# Patient Record
Sex: Female | Born: 1950 | Race: White | Hispanic: No | Smoking: Never smoker
Health system: Southern US, Community
[De-identification: ages and names within clinical notes are randomized; demographics above are authoritative.]

## PROBLEM LIST (undated history)

## (undated) DIAGNOSIS — M1990 Unspecified osteoarthritis, unspecified site: Secondary | ICD-10-CM

## (undated) DIAGNOSIS — Z87442 Personal history of urinary calculi: Secondary | ICD-10-CM

## (undated) DIAGNOSIS — E039 Hypothyroidism, unspecified: Secondary | ICD-10-CM

## (undated) DIAGNOSIS — E7800 Pure hypercholesterolemia, unspecified: Secondary | ICD-10-CM

## (undated) DIAGNOSIS — R7303 Prediabetes: Secondary | ICD-10-CM

## (undated) DIAGNOSIS — C801 Malignant (primary) neoplasm, unspecified: Secondary | ICD-10-CM

## (undated) DIAGNOSIS — H409 Unspecified glaucoma: Secondary | ICD-10-CM

## (undated) DIAGNOSIS — T7840XA Allergy, unspecified, initial encounter: Secondary | ICD-10-CM

## (undated) DIAGNOSIS — H269 Unspecified cataract: Secondary | ICD-10-CM

## (undated) HISTORY — PX: COLONOSCOPY: SHX174

## (undated) HISTORY — DX: Allergy, unspecified, initial encounter: T78.40XA

## (undated) HISTORY — DX: Unspecified cataract: H26.9

## (undated) HISTORY — DX: Unspecified glaucoma: H40.9

## (undated) HISTORY — DX: Personal history of urinary calculi: Z87.442

## (undated) HISTORY — PX: CATARACT EXTRACTION, BILATERAL: SHX1313

## (undated) HISTORY — PX: EYE SURGERY: SHX253

## (undated) HISTORY — PX: LIPOMA EXCISION: SHX5283

## (undated) HISTORY — DX: Malignant (primary) neoplasm, unspecified: C80.1

## (undated) HISTORY — PX: TONSILLECTOMY: SUR1361

---

## 1984-08-21 HISTORY — PX: TUBAL LIGATION: SHX77

## 2003-01-11 ENCOUNTER — Other Ambulatory Visit: Admission: RE | Admit: 2003-01-11 | Discharge: 2003-01-11 | Payer: Self-pay | Admitting: Obstetrics and Gynecology

## 2004-10-20 ENCOUNTER — Other Ambulatory Visit: Admission: RE | Admit: 2004-10-20 | Discharge: 2004-10-20 | Payer: Self-pay | Admitting: Obstetrics and Gynecology

## 2005-04-23 LAB — HM COLONOSCOPY: HM COLON: NORMAL

## 2006-03-23 HISTORY — PX: CERVICAL BIOPSY: SHX590

## 2006-03-25 ENCOUNTER — Encounter (INDEPENDENT_AMBULATORY_CARE_PROVIDER_SITE_OTHER): Payer: Self-pay | Admitting: Specialist

## 2006-03-25 ENCOUNTER — Ambulatory Visit (HOSPITAL_COMMUNITY): Admission: RE | Admit: 2006-03-25 | Discharge: 2006-03-25 | Payer: Self-pay | Admitting: Obstetrics and Gynecology

## 2009-12-02 ENCOUNTER — Encounter
Admission: RE | Admit: 2009-12-02 | Discharge: 2010-01-20 | Payer: Self-pay | Source: Home / Self Care | Admitting: Family Medicine

## 2009-12-16 ENCOUNTER — Encounter: Admission: RE | Admit: 2009-12-16 | Discharge: 2009-12-16 | Payer: Self-pay | Admitting: Family Medicine

## 2009-12-16 LAB — HM DEXA SCAN

## 2010-09-08 NOTE — Op Note (Signed)
Kathleen Mcclain, HE             ACCOUNT NO.:  [id number]   MEDICAL RECORD NO.:  [id number]          PATIENT TYPE:  AMB   LOCATION:  SDC                           FACILITY:  [facility]   PHYSICIAN:  Randye Lobo, M.D.   DATE OF BIRTH:  1950/11/08   DATE OF PROCEDURE:  03/25/2006  DATE OF DISCHARGE:                               OPERATIVE REPORT   PREOPERATIVE DIAGNOSIS:  High-grade squamous intraepithelial lesion.   POSTOPERATIVE DIAGNOSIS:  High-grade squamous intraepithelial lesion.   PROCEDURE:  Cold knife conization of the cervix.   SURGEON:  Randye Lobo, M.D.   ANESTHESIA:  MAC.   IV FLUIDS:  1200 mL Ringer's lactate.   ESTIMATED BLOOD LOSS:  Minimal.   URINE OUTPUT:  5 mL by I&O catheterization.   COMPLICATIONS:  None.   INDICATIONS FOR THE PROCEDURE:  The patient is a 60 year old para 19  Caucasian female with an abnormal Pap smear in September 2007.  The  patient had an ASCUS Pap smear cannot rule out high-grade squamous  intraepithelial lesion, as she had testing which was positive for high  risk HPV.  The patient underwent a colposcopy on 02/21/2006 which  documented an ECC with atypical cells consistent with a squamous  fragment with both low-grade and high-grade squamous intraepithelial  lesion features.  The biopsy of the exocervix documented cervicitis and  atypia consistent with high-grade squamous intraepithelial lesion.  A  plan was made to proceed with a LEEP conization of the cervix after  risks, benefits, and alternatives were discussed.   FINDINGS:  When the speculum was placed inside vagina both the vaginal  mucosa and the cervix appeared to be friable to the touch.  Lugol's  solution was applied and the patient appeared to have a squamocolumnar  junction which extended further inferiorly than  superiorly.  The  visualization with the insulated speculum was not ideal for performing a  LEEP procedure.  A Sims retractor and a Occupational hygienist were  used  instead, which gave excellent visualization of the cervix.  The decision  was made, at this time, to proceed with a cold knife conization of the  cervix to improve the cone procedure for the patient's anatomy.   SPECIMENS:  The cone specimen was sent to pathology with a suture tied  at the 12 o'clock position.   DESCRIPTION OF PROCEDURE:  The patient was reidentified in the  preoperative hold area.  She did receive clindamycin 900 mg IV for  antibiotic prophylaxis.   In the operating room, MAC anesthesia was induced; and the patient was  placed in the dorsal lithotomy position.  The vagina was sterilely  prepped and the patient was catheterized of urine.  It appeared that she  had previously emptied her bladder.  The patient was then sterilely  draped.   An insulated speculum was placed in the vagina and the findings are as  noted above.  The insulated speculum was withdrawn, and a Sims retractor  and the Deaver retractor were used to create much better visualization  of the cervix.  At this time, a decision was made  to proceed with a cold  knife conization of the cervix.  Figure-of-eight sutures were placed at  the 3 and 6 o'clock positions of the cervix to create hemostasis of the  descending branches of the uterine arteries.  These sutures were held.  The sutures were #0 chromic sutures.  The cervix was restained with some  Lugol's material, at this time.  The angled scalpel was then used to  sharply excise the cone specimen.  The specimen was then marked at the  12 o'clock position with silk suture; and was sent to pathology.   Two Sturmdorf sutures of #0 chromic were placed at the 12 and 6 o'clock  positions.  The endocervix had been previously coagulated with monopolar  cautery in order to create good hemostasis, but the sutures were helpful  in creating additional hemostasis at the 6 and 12 o'clock positions.  Hemostasis was noted to be satisfactory, at this time; and  the sutures  were, therefore, cut to leave short tags.  The instruments were removed  from the vagina.   This concluded the patient's procedure.  There were no complications.  All needle, instrument, and sponge counts were correct.      Randye Lobo, M.D.  Electronically Signed     BES/MEDQ  D:  03/25/2006  T:  03/25/2006  Job:  [id number]

## 2011-03-30 ENCOUNTER — Emergency Department (HOSPITAL_COMMUNITY): Payer: BC Managed Care – PPO

## 2011-03-30 ENCOUNTER — Encounter: Payer: Self-pay | Admitting: Emergency Medicine

## 2011-03-30 ENCOUNTER — Emergency Department (HOSPITAL_COMMUNITY)
Admission: EM | Admit: 2011-03-30 | Discharge: 2011-03-30 | Disposition: A | Discharge status: Home / Self Care | Payer: BC Managed Care – PPO | Attending: Emergency Medicine | Admitting: Emergency Medicine

## 2011-03-30 DIAGNOSIS — E039 Hypothyroidism, unspecified: Secondary | ICD-10-CM | POA: Insufficient documentation

## 2011-03-30 DIAGNOSIS — E7800 Pure hypercholesterolemia, unspecified: Secondary | ICD-10-CM | POA: Insufficient documentation

## 2011-03-30 DIAGNOSIS — R1110 Vomiting, unspecified: Secondary | ICD-10-CM | POA: Insufficient documentation

## 2011-03-30 DIAGNOSIS — R197 Diarrhea, unspecified: Secondary | ICD-10-CM | POA: Insufficient documentation

## 2011-03-30 DIAGNOSIS — R1032 Left lower quadrant pain: Secondary | ICD-10-CM | POA: Insufficient documentation

## 2011-03-30 DIAGNOSIS — N201 Calculus of ureter: Secondary | ICD-10-CM

## 2011-03-30 HISTORY — DX: Pure hypercholesterolemia, unspecified: E78.00

## 2011-03-30 HISTORY — DX: Hypothyroidism, unspecified: E03.9

## 2011-03-30 LAB — URINE MICROSCOPIC-ADD ON

## 2011-03-30 LAB — COMPREHENSIVE METABOLIC PANEL
ALT: 17 U/L (ref 0–35)
Alkaline Phosphatase: 61 U/L (ref 39–117)
Chloride: 104 mEq/L (ref 96–112)
Creatinine, Ser: 0.86 mg/dL (ref 0.50–1.10)
GFR calc Af Amer: 85 mL/min — ABNORMAL LOW (ref >90)
GFR calc non Af Amer: 73 mL/min — ABNORMAL LOW (ref >90)
Glucose, Bld: 162 mg/dL — ABNORMAL HIGH (ref 70–99)
Potassium: 3.6 mEq/L (ref 3.5–5.1)
Total Bilirubin: 0.5 mg/dL (ref 0.3–1.2)

## 2011-03-30 LAB — URINALYSIS, ROUTINE W REFLEX MICROSCOPIC
Ketones, ur: NEGATIVE mg/dL
Nitrite: NEGATIVE
Urobilinogen, UA: 0.2 mg/dL (ref 0.0–1.0)

## 2011-03-30 LAB — CBC
Hemoglobin: 13.9 g/dL (ref 12.0–15.0)
MCH: 29 pg (ref 26.0–34.0)
Platelets: 258 10*3/uL (ref 150–400)
RBC: 4.8 MIL/uL (ref 3.87–5.11)
RDW: 12.8 % (ref 11.5–15.5)
WBC: 15.9 10*3/uL — ABNORMAL HIGH (ref 4.0–10.5)

## 2011-03-30 LAB — DIFFERENTIAL
Basophils Absolute: 0.1 10*3/uL (ref 0.0–0.1)
Eosinophils Absolute: 0 10*3/uL (ref 0.0–0.7)
Eosinophils Relative: 0 % (ref 0–5)
Monocytes Absolute: 0.8 10*3/uL (ref 0.1–1.0)
Neutro Abs: 13.6 10*3/uL — ABNORMAL HIGH (ref 1.7–7.7)
Neutrophils Relative %: 86 % — ABNORMAL HIGH (ref 43–77)

## 2011-03-30 MED ORDER — SODIUM CHLORIDE 0.9 % IV BOLUS (SEPSIS)
2000.0000 mL | Freq: Once | INTRAVENOUS | Status: AC
  Administered 2011-03-30: 1000 mL via INTRAVENOUS

## 2011-03-30 MED ORDER — ONDANSETRON HCL 4 MG PO TABS: 4.0000 mg | ORAL_TABLET | Freq: Four times a day (QID) | ORAL | Status: AC

## 2011-03-30 MED ORDER — OXYCODONE-ACETAMINOPHEN 5-325 MG PO TABS: 2.0000 | ORAL_TABLET | ORAL | Status: AC | PRN

## 2011-03-30 MED ORDER — CIPROFLOXACIN HCL 500 MG PO TABS: 500.0000 mg | ORAL_TABLET | Freq: Two times a day (BID) | ORAL | Status: AC

## 2011-03-30 MED ORDER — SODIUM CHLORIDE 0.9 % IV SOLN: INTRAVENOUS | Status: DC

## 2011-03-30 MED ORDER — KETOROLAC TROMETHAMINE 30 MG/ML IJ SOLN
30.0000 mg | Freq: Once | INTRAMUSCULAR | Status: AC
  Administered 2011-03-30: 30 mg via INTRAVENOUS
  Filled 2011-03-30: qty 1

## 2011-03-30 MED ORDER — ONDANSETRON HCL 4 MG/2ML IJ SOLN
4.0000 mg | Freq: Once | INTRAMUSCULAR | Status: AC
  Administered 2011-03-30: 4 mg via INTRAVENOUS
  Filled 2011-03-30: qty 2

## 2011-03-30 MED ORDER — IOHEXOL 300 MG/ML  SOLN
80.0000 mL | Freq: Once | INTRAMUSCULAR | Status: AC | PRN
  Administered 2011-03-30: 80 mL via INTRAVENOUS

## 2011-03-30 MED ORDER — HYDROMORPHONE HCL PF 1 MG/ML IJ SOLN
1.0000 mg | Freq: Once | INTRAMUSCULAR | Status: AC
  Administered 2011-03-30: 1 mg via INTRAVENOUS
  Filled 2011-03-30: qty 1

## 2011-03-30 NOTE — ED Provider Notes (Signed)
History     CSN: [id number] Arrival date & time: 03/30/2011  3:59 AM   First MD Initiated Contact with Patient 03/30/11 [phone]      Chief Complaint  Patient presents with  . Abdominal Pain    (Consider location/radiation/quality/duration/timing/severity/associated sxs/prior treatment) HPI This 60 year old female has about 12 hours of left lower quadrant constant positional abdominal pain which is moderately severe and associated with nonbloody vomiting and diarrhea without dysuria chest pain shortness of breath or right-sided abdominal pain. This pain is not colicky. It has been constant without radiation or associated symptoms other than the vomiting and diarrhea. She's had no fever or chills bodyaches. She has not had diverticulitis in the past but may have had diverticulosis and a prior colonoscopy but she is not sure about that. Past Medical History  Diagnosis Date  . Hypothyroid   . Hypercholesteremia   . Hypothyroidism     Past Surgical History  Procedure Date  . Tubal ligation     No family history on file.  History  Substance Use Topics  . Smoking status: Never Smoker   . Smokeless tobacco: Not on file  . Alcohol Use: No    OB History    Grav Para Term Preterm Abortions TAB SAB Ect Mult Living                  Review of Systems  Constitutional: Negative for fever.       10 Systems reviewed and are negative for acute change except as noted in the HPI.  HENT: Negative for congestion.   Eyes: Negative for discharge and redness.  Respiratory: Negative for cough and shortness of breath.   Cardiovascular: Negative for chest pain.  Gastrointestinal: Positive for nausea, vomiting, abdominal pain and diarrhea. Negative for blood in stool.  Genitourinary: Negative for dysuria.  Musculoskeletal: Negative for back pain.  Skin: Negative for rash.  Neurological: Negative for syncope, numbness and headaches.  Psychiatric/Behavioral:       No behavior change.     Allergies  Penicillins  Home Medications   Current Outpatient Rx  Name Route Sig Dispense Refill  . ALENDRONATE SODIUM 70 MG PO TABS Oral Take 70 mg by mouth every 7 (seven) days. On saturday     . ATORVASTATIN CALCIUM 10 MG PO TABS Oral Take 10 mg by mouth daily.      Marland Kitchen ESTRADIOL 10 MCG VA TABS Vaginal Place 1 tablet vaginally 2 (two) times a week. Monday and wednesdays     . LEVOTHYROXINE SODIUM 75 MCG PO TABS Oral Take 75 mcg by mouth daily.        BP 161/89  Pulse 109  Temp(Src) 98 F (36.7 C) (Oral)  Resp 16  SpO2 100%  Physical Exam  Nursing note and vitals reviewed. Constitutional:       Awake, alert, nontoxic appearance.  HENT:  Head: Atraumatic.       Oropharynx noninjected mucous membranes somewhat dry  Eyes: Right eye exhibits no discharge. Left eye exhibits no discharge.  Neck: Neck supple.  Cardiovascular: Normal rate and regular rhythm.   No murmur heard. Pulmonary/Chest: Effort normal and breath sounds normal. No respiratory distress. She has no wheezes. She has no rales. She exhibits no tenderness.  Abdominal: Soft. Bowel sounds are normal. She exhibits no mass. There is tenderness. There is guarding. There is no rebound.       Moderate left lower quadrant tenderness without rebound or percussion tenderness; no CVA tenderness  Musculoskeletal: She  exhibits no edema and no tenderness.       Baseline ROM, no obvious new focal weakness.  Neurological: She is alert.       Mental status and motor strength appears baseline for patient and situation.  Skin: No rash noted.  Psychiatric: She has a normal mood and affect.    ED Course  Procedures (including critical care time)  CT pnd, care assumed by Dr. Rosalia Hammers for Dx and ED Dispo 0800 Labs Reviewed  COMPREHENSIVE METABOLIC PANEL - Abnormal; Notable for the following:    Glucose, Bld 162 (*)    GFR calc non Af Amer 73 (*)    GFR calc Af Amer 85 (*)    All other components within normal limits  CBC -  Abnormal; Notable for the following:    WBC 15.9 (*)    All other components within normal limits  DIFFERENTIAL - Abnormal; Notable for the following:    Neutrophils Relative 86 (*)    Neutro Abs 13.6 (*)    Lymphocytes Relative 8 (*)    All other components within normal limits  URINALYSIS, ROUTINE W REFLEX MICROSCOPIC - Abnormal; Notable for the following:    APPearance TURBID (*)    pH 8.5 (*)    Leukocytes, UA SMALL (*)    All other components within normal limits  URINE MICROSCOPIC-ADD ON - Abnormal; Notable for the following:    Bacteria, UA FEW (*)    All other components within normal limits  CBC  LIPASE, BLOOD   No results found.   No diagnosis found.    MDM          Hurman Horn, MD 03/30/11 [phone]

## 2011-03-30 NOTE — ED Provider Notes (Signed)
Patient's care assumed.  Patient states sudden onset of left flank pain last night at 11 PM. She has not had any diarrhea but has had an episode of vomiting. She states the only thing that made the pain better was getting the pain medicine here. Pain is currently 2/10. She has 0-3 red blood cells in her urine. Abdomen is soft and nontender. CT scan is pending.  Patient with 3 mm stone left uvj- pain fee.    Hilario Quarry, MD 03/30/11 [phone]

## 2011-03-30 NOTE — ED Notes (Signed)
Last ate 1700, last emesis 0200, last BM 0130 (scant/ soft). Denies fever.

## 2011-03-30 NOTE — ED Notes (Signed)
PT. REPORTS LEFT ABDOMINAL PAIN WITH VOMITTING  AND DIARRHEA ONSET LAST NIGHT , SLIGHT CHILLS , DENIES FEVER ,  OCCASIONAL DRY COUGH .

## 2013-02-26 ENCOUNTER — Other Ambulatory Visit: Payer: Self-pay | Admitting: Family Medicine

## 2013-02-26 DIAGNOSIS — R0781 Pleurodynia: Secondary | ICD-10-CM

## 2013-02-26 DIAGNOSIS — R1012 Left upper quadrant pain: Secondary | ICD-10-CM

## 2013-02-26 DIAGNOSIS — G8929 Other chronic pain: Secondary | ICD-10-CM

## 2013-03-02 ENCOUNTER — Ambulatory Visit
Admission: RE | Admit: 2013-03-02 | Discharge: 2013-03-02 | Disposition: A | Discharge status: Home / Self Care | Payer: BC Managed Care – PPO | Source: Ambulatory Visit | Attending: Family Medicine | Admitting: Family Medicine

## 2013-03-02 DIAGNOSIS — R0781 Pleurodynia: Secondary | ICD-10-CM

## 2013-03-02 DIAGNOSIS — G8929 Other chronic pain: Secondary | ICD-10-CM

## 2013-03-02 DIAGNOSIS — R1012 Left upper quadrant pain: Secondary | ICD-10-CM

## 2013-03-26 ENCOUNTER — Other Ambulatory Visit: Payer: Self-pay

## 2014-04-06 ENCOUNTER — Other Ambulatory Visit: Payer: Self-pay

## 2014-04-06 LAB — HM PAP SMEAR: HM PAP: NORMAL

## 2014-04-08 LAB — CYTOLOGY - PAP

## 2015-03-07 ENCOUNTER — Encounter: Payer: Self-pay | Admitting: Family Medicine

## 2015-03-07 DIAGNOSIS — H269 Unspecified cataract: Secondary | ICD-10-CM | POA: Insufficient documentation

## 2015-03-07 DIAGNOSIS — Z87442 Personal history of urinary calculi: Secondary | ICD-10-CM | POA: Insufficient documentation

## 2015-03-07 DIAGNOSIS — E7800 Pure hypercholesterolemia, unspecified: Secondary | ICD-10-CM | POA: Insufficient documentation

## 2015-03-07 DIAGNOSIS — T7840XA Allergy, unspecified, initial encounter: Secondary | ICD-10-CM | POA: Insufficient documentation

## 2015-03-07 DIAGNOSIS — E119 Type 2 diabetes mellitus without complications: Secondary | ICD-10-CM | POA: Insufficient documentation

## 2015-03-07 DIAGNOSIS — H409 Unspecified glaucoma: Secondary | ICD-10-CM | POA: Insufficient documentation

## 2015-03-09 ENCOUNTER — Ambulatory Visit (INDEPENDENT_AMBULATORY_CARE_PROVIDER_SITE_OTHER): Payer: BLUE CROSS/BLUE SHIELD | Admitting: Physician Assistant

## 2015-03-09 ENCOUNTER — Encounter: Payer: Self-pay | Admitting: Physician Assistant

## 2015-03-09 VITALS — BP 152/96 | HR 84 | Temp 98.4°F | Resp 18 | Ht 65.0 in | Wt 114.0 lb

## 2015-03-09 DIAGNOSIS — R739 Hyperglycemia, unspecified: Secondary | ICD-10-CM | POA: Diagnosis not present

## 2015-03-09 DIAGNOSIS — E039 Hypothyroidism, unspecified: Secondary | ICD-10-CM | POA: Diagnosis not present

## 2015-03-09 DIAGNOSIS — Z23 Encounter for immunization: Secondary | ICD-10-CM

## 2015-03-09 DIAGNOSIS — R030 Elevated blood-pressure reading, without diagnosis of hypertension: Secondary | ICD-10-CM | POA: Diagnosis not present

## 2015-03-09 DIAGNOSIS — IMO0001 Reserved for inherently not codable concepts without codable children: Secondary | ICD-10-CM

## 2015-03-09 DIAGNOSIS — E7800 Pure hypercholesterolemia, unspecified: Secondary | ICD-10-CM

## 2015-03-09 DIAGNOSIS — Z0000 Encounter for general adult medical examination without abnormal findings: Secondary | ICD-10-CM | POA: Diagnosis not present

## 2015-03-09 LAB — LIPID PANEL
CHOLESTEROL: 148 mg/dL (ref 125–200)
HDL: 73 mg/dL (ref >=46)
LDL CALC: 63 mg/dL (ref <130)
TRIGLYCERIDES: 58 mg/dL (ref <150)
Total CHOL/HDL Ratio: 2 Ratio (ref <=5.0)
VLDL: 12 mg/dL (ref <30)

## 2015-03-09 LAB — CBC WITH DIFFERENTIAL/PLATELET
Basophils Absolute: 0.1 10*3/uL (ref 0.0–0.1)
Basophils Relative: 1 % (ref 0–1)
EOS PCT: 1 % (ref 0–5)
Eosinophils Absolute: 0.1 10*3/uL (ref 0.0–0.7)
HEMATOCRIT: 45.4 % (ref 36.0–46.0)
HEMOGLOBIN: 14.9 g/dL (ref 12.0–15.0)
LYMPHS PCT: 27 % (ref 12–46)
Lymphs Abs: 1.8 10*3/uL (ref 0.7–4.0)
MCH: 28.3 pg (ref 26.0–34.0)
MCHC: 32.8 g/dL (ref 30.0–36.0)
MCV: 86.1 fL (ref 78.0–100.0)
MONO ABS: 0.5 10*3/uL (ref 0.1–1.0)
MONOS PCT: 7 % (ref 3–12)
MPV: 11.7 fL (ref 8.6–12.4)
NEUTROS ABS: 4.2 10*3/uL (ref 1.7–7.7)
Neutrophils Relative %: 64 % (ref 43–77)
Platelets: 249 10*3/uL (ref 150–400)
RBC: 5.27 MIL/uL — ABNORMAL HIGH (ref 3.87–5.11)
RDW: 13.7 % (ref 11.5–15.5)
WBC: 6.6 10*3/uL (ref 4.0–10.5)

## 2015-03-09 LAB — COMPLETE METABOLIC PANEL WITH GFR
ALBUMIN: 4.6 g/dL (ref 3.6–5.1)
ALK PHOS: 68 U/L (ref 33–130)
ALT: 16 U/L (ref 6–29)
AST: 19 U/L (ref 10–35)
BUN: 10 mg/dL (ref 7–25)
CALCIUM: 10.2 mg/dL (ref 8.6–10.4)
CHLORIDE: 106 mmol/L (ref 98–110)
CO2: 26 mmol/L (ref 20–31)
Creat: 0.74 mg/dL (ref 0.50–0.99)
GFR, EST NON AFRICAN AMERICAN: 88 mL/min (ref >=60)
Glucose, Bld: 85 mg/dL (ref 70–99)
POTASSIUM: 5.1 mmol/L (ref 3.5–5.3)
Sodium: 143 mmol/L (ref 135–146)
Total Bilirubin: 1.3 mg/dL — ABNORMAL HIGH (ref 0.2–1.2)
Total Protein: 7.2 g/dL (ref 6.1–8.1)

## 2015-03-09 LAB — HEMOGLOBIN A1C
HEMOGLOBIN A1C: 5.7 % — AB (ref <5.7)
MEAN PLASMA GLUCOSE: 117 mg/dL — AB (ref <117)

## 2015-03-09 MED ORDER — LEVOTHYROXINE SODIUM 75 MCG PO TABS: 75.0000 ug | ORAL_TABLET | Freq: Every day | ORAL | Status: DC

## 2015-03-09 MED ORDER — ATORVASTATIN CALCIUM 10 MG PO TABS: 10.0000 mg | ORAL_TABLET | Freq: Every day | ORAL | Status: DC

## 2015-03-09 NOTE — Progress Notes (Signed)
Patient ID: Kathleen Mcclain MRN: [id number], DOB: [birth year], 64 y.o. Date of Encounter: 03/09/2015,   Chief Complaint: Physical (CPE)  HPI: 64 y.o. y/o white female  here for CPE.   She is also being seen as a new patient to establish care. She was going to the [facility] but since it has closed she is establishing care here.  She sees a gynecologist on a routine basis. States that she also is seen at [facility] urology because of history of kidney stones. Says that she now goes there every 2 years. States that she also sees ophthalmologist routinely Dr. Posey Pronto  She states that several years ago she was told that she was prediabetic and was informed regarding diet and exercise changes.  She states that she loved sweets and carbs and made changes and lost 30 pounds. She continues to be very careful with her diet but says that she does not exercise.  She does not work outside of the home. She states that she has 2 sets of grandchildren. One set lives nearby and the other set lives at [place].  She is taking cholesterol medication as directed. No myalgias or other adverse effects. She is taking her thyroid medication daily.   Review of Systems: Consitutional: No fever, chills, fatigue, night sweats, lymphadenopathy. No significant/unexplained weight changes. Eyes: No visual changes, eye redness, or discharge. ENT/Mouth: No ear pain, sore throat, nasal drainage, or sinus pain. Cardiovascular: No chest pressure,heaviness, tightness or squeezing, even with exertion. No increased shortness of breath or dyspnea on exertion.No palpitations, edema, orthopnea, PND. Respiratory: No cough, hemoptysis, SOB, or wheezing. Gastrointestinal: No anorexia, dysphagia, reflux, pain, nausea, vomiting, hematemesis, diarrhea, constipation, BRBPR, or melena. Breast: No mass, nodules, bulging, or retraction. No skin changes or inflammation. No nipple discharge. No lymphadenopathy. Genitourinary: No  dysuria, hematuria, incontinence, vaginal discharge, pruritis, burning, abnormal bleeding, or pain. Musculoskeletal: No decreased ROM, No joint pain or swelling. No significant pain in neck, back, or extremities. Skin: No rash, pruritis, or concerning lesions. Neurological: No headache, dizziness, syncope, seizures, tremors, memory loss, coordination problems, or paresthesias. Psychological: No anxiety, depression, hallucinations, SI/HI. Endocrine: No polydipsia, polyphagia, polyuria, or known diabetes.No increased fatigue. No palpitations/rapid heart rate. No significant/unexplained weight change. All other systems were reviewed and are otherwise negative.  Past Medical History  Diagnosis Date  . Hypothyroid   . Hypothyroidism   . Allergy   . Cataract   . Diabetes mellitus without complication ([place])     pre diabetic  . Glaucoma   . Hypercholesteremia   . Chronic kidney disease   . Personal history of kidney stones   . Cancer [facility])     pre cancer cells cervical     Past Surgical History  Procedure Laterality Date  . Cervical biopsy  12/07    pre cancerous cells  . Tubal ligation  5/86    Home Meds:  Outpatient Prescriptions Prior to Visit  Medication Sig Dispense Refill  . atorvastatin (LIPITOR) 10 MG tablet Take 10 mg by mouth daily.      . Carboxymeth-Glycerin-Polysorb (REFRESH OPTIVE ADVANCED) 0.5-1-0.5 % SOLN Place 1 drop into both eyes as needed.    . dorzolamide (TRUSOPT) 2 % ophthalmic solution Place 1 drop into both eyes 2 (two) times daily.    Marland Kitchen ketotifen (ZADITOR) 0.025 % ophthalmic solution Place 1 drop into both eyes as needed.    Marland Kitchen levothyroxine (SYNTHROID, LEVOTHROID) 75 MCG tablet Take 75 mcg by mouth daily.      Marland Kitchen  Multiple Vitamin (MULTIVITAMIN) tablet Take 1 tablet by mouth daily.    . Travoprost, BAK Free, (TRAVATAN) 0.004 % SOLN ophthalmic solution Place 1 drop into both eyes at bedtime.    . Estradiol (VAGIFEM) 10 MCG TABS Place 1 tablet vaginally 2 (two)  times a week. Monday and wednesdays     . alendronate (FOSAMAX) 70 MG tablet Take 70 mg by mouth every 7 (seven) days. On saturday      No facility-administered medications prior to visit.    Allergies:  Allergies  Allergen Reactions  . Alphagan [Brimonidine] Other (See Comments)    Opthalmic solution    Red itchy eyes  . Penicillins Swelling    Social History   Social History  . Marital Status: Married    Spouse Name: N/A  . Number of Children: N/A  . Years of Education: N/A   Occupational History  . Not on file.   Social History Main Topics  . Smoking status: Never Smoker   . Smokeless tobacco: Never Used  . Alcohol Use: No  . Drug Use: No  . Sexual Activity: Not on file   Other Topics Concern  . Not on file   Social History Narrative   Does not work outside of home.    Has 2 sets of grandchildren--1 set lives in this area. 1 set lives at [place].   Approximately 2013-- Was told "PreDiabetic"---She lost 30 pounds. Very careful with diet now. Does not exercise.    Family History  Problem Relation Age of Onset  . Alzheimer's disease Mother   . Cancer Father     Pancreatic Cancer  . Hyperlipidemia Father   . Arthritis Maternal Grandmother   . Diabetes Maternal Grandfather   . Stroke Maternal Grandfather   . Cancer Paternal Grandmother   . Cancer Paternal Grandfather     Physical Exam: Blood pressure 152/96, pulse 84, temperature 98.4 F (36.9 C), temperature source Oral, resp. rate 18, height 5\' 5"  (1.651 m), weight 114 lb (51.71 kg)., Body mass index is 18.97 kg/(m^2). General: Well developed, well nourished, WF. Appears in no acute distress. HEENT: Normocephalic, atraumatic. Conjunctiva pink, sclera non-icteric. Pupils 2 mm constricting to 1 mm, round, regular, and equally reactive to light and accomodation. EOMI. Internal auditory canal clear. TMs with good cone of light and without pathology. Nasal mucosa pink. Nares are without discharge. No sinus  tenderness. Oral mucosa pink.  Pharynx without exudate.   Neck: Supple. Trachea midline. No thyromegaly. Full ROM. No lymphadenopathy.No Carotid Bruits. Lungs: Clear to auscultation bilaterally without wheezes, rales, or rhonchi. Breathing is of normal effort and unlabored. Cardiovascular: RRR with S1 S2. No murmurs, rubs, or gallops. Distal pulses 2+ symmetrically. No carotid or abdominal bruits. Breast: Per Gyn Abdomen: Soft, non-tender, non-distended with normoactive bowel sounds. No hepatosplenomegaly or masses. No rebound/guarding. No CVA tenderness. No hernias.  Genitourinary: Per Gyn. Musculoskeletal: Full range of motion and 5/5 strength throughout.  Skin: Warm and moist without erythema, ecchymosis, wounds, or rash. Neuro: A+Ox3. CN II-XII grossly intact. Moves all extremities spontaneously. Full sensation throughout. Normal gait. DTR 2+ throughout upper and lower extremities.  Psych:  Responds to questions appropriately with a normal affect.   Assessment/Plan:  64 y.o. y/o female here for CPE  1. Visit for preventive health examination  A. Screening Labs: She had one bite of banana that 8:30 AM and then realized that she should not be eating so did not eat anything else. - CBC with Differential/Platelet - COMPLETE METABOLIC  PANEL WITH GFR - Hemoglobin A1c - Lipid panel - TSH - VITAMIN D 25 Hydroxy (Vit-D Deficiency, Fractures)  B. Pap: This is managed by GYN. She states her next appointment there is in December.  C. Screening Mammogram: This is managed by GYN. She states her next appointment there is in December.  D. DEXA/BMD:  She reports that she has had one bone density scan about 2 years ago. Also I Fosamax on her medication list, but she states that she is no longer taking this. She states that this was stopped around the same time that she was having kidney stones and she isn't sure if it was stopped because it has to do with her calcium and kidney stones Asked her  if she remembers whether with her gynecologist was doing the bone density scan or if it was through Dr. Greta Doom clinic/PCP. Told her to discuss this with her gynecologist at her visit there in December so I will know whether gynecologist is going to follow this up or whether I need to  E. Colorectal Cancer Screening: She states that she has had one colonoscopy performed in the past and her next one is due in about 1 or 2 more years.  F. Immunizations:  Influenza: She is agreeable to receive this here today.---Given here 03/09/15 Tetanus:  She states that she remembers when her grandchildren were babies she got the pertussis vaccine so it sounds like she would've gotten the T dap and this will be up to date. Pneumococcal:  She has no indication to require the pneumonia vaccines until age 89 Zostavax:  She states that she was wanting to get this in the past but has not gotten it yet and wants to receive this today. Given here 03/09/15   2. Hyperglycemia When she was diagnosed with this 2 years ago she lost 30 pounds. She is very compliant with diet. Says she is aware she is supposed to be exercising but says not do routine exercise. Will check lab to monitor. - Hemoglobin A1c  3. Hypercholesteremia She states that her prior PCP was doing office visits and labs only once per year. She is on Lipitor 10 mg. Check lab to monitor. - COMPLETE METABOLIC PANEL WITH GFR - Lipid panel  4. Hypothyroidism, unspecified hypothyroidism type She states that her prior PCP was doing office visits and labs only once per year. She is on levothyroxine 75 g daily. Check labs to monitor. - TSH   5. White coat hypertension She states that she just bought a new blood pressure cuff for one year ago and knows that it reads accurately. States that at home she gets systolics [id number] to [id number]. She will continue to monitor this at home. If readings do increase >140/90, she will follow-up with [place].  Need for zoster  vaccination - Varicella-zoster vaccine subcutaneous  Need for prophylactic vaccination and inoculation against influenza - Flu Vaccine QUAD 36+ mos IM  Her prior PCP was doing office visits and labs only once per year. Will plan follow-up in one year or sooner if needed.   [address] [place], [place], [facility] 03/09/2015 11:37 AM

## 2015-03-10 ENCOUNTER — Encounter: Payer: Self-pay | Admitting: Family Medicine

## 2015-03-10 LAB — TSH: TSH: 0.535 u[IU]/mL (ref 0.350–4.500)

## 2015-03-10 LAB — VITAMIN D 25 HYDROXY (VIT D DEFICIENCY, FRACTURES): Vit D, 25-Hydroxy: 40 ng/mL (ref 30–100)

## 2015-04-13 ENCOUNTER — Other Ambulatory Visit: Payer: Self-pay | Admitting: Obstetrics and Gynecology

## 2015-04-14 LAB — CYTOLOGY - PAP

## 2015-05-04 ENCOUNTER — Encounter: Payer: Self-pay | Admitting: Family Medicine

## 2015-05-31 ENCOUNTER — Other Ambulatory Visit: Payer: Self-pay | Admitting: Family Medicine

## 2015-05-31 MED ORDER — ATORVASTATIN CALCIUM 10 MG PO TABS: 10.0000 mg | ORAL_TABLET | Freq: Every day | ORAL | Status: DC

## 2015-05-31 MED ORDER — LEVOTHYROXINE SODIUM 75 MCG PO TABS: 75.0000 ug | ORAL_TABLET | Freq: Every day | ORAL | Status: DC

## 2015-05-31 NOTE — Telephone Encounter (Signed)
Medication refilled per protocol. 

## 2015-06-02 ENCOUNTER — Other Ambulatory Visit: Payer: Self-pay | Admitting: Family Medicine

## 2015-06-02 MED ORDER — ATORVASTATIN CALCIUM 10 MG PO TABS: 10.0000 mg | ORAL_TABLET | Freq: Every day | ORAL | Status: DC

## 2015-06-02 MED ORDER — LEVOTHYROXINE SODIUM 75 MCG PO TABS: 75.0000 ug | ORAL_TABLET | Freq: Every day | ORAL | Status: DC

## 2015-06-02 NOTE — Telephone Encounter (Signed)
Medication refilled per protocol. 

## 2016-03-05 ENCOUNTER — Ambulatory Visit (INDEPENDENT_AMBULATORY_CARE_PROVIDER_SITE_OTHER): Payer: BLUE CROSS/BLUE SHIELD | Admitting: Family Medicine

## 2016-03-05 DIAGNOSIS — Z23 Encounter for immunization: Secondary | ICD-10-CM | POA: Diagnosis not present

## 2016-03-09 ENCOUNTER — Other Ambulatory Visit: Payer: Self-pay

## 2016-03-09 MED ORDER — LEVOTHYROXINE SODIUM 75 MCG PO TABS: 75.0000 ug | ORAL_TABLET | Freq: Every day | ORAL | 0 refills | Status: DC

## 2016-03-09 MED ORDER — ATORVASTATIN CALCIUM 10 MG PO TABS: 10.0000 mg | ORAL_TABLET | Freq: Every day | ORAL | 0 refills | Status: DC

## 2016-03-09 NOTE — Telephone Encounter (Signed)
Rx filled per protocol  

## 2016-03-19 ENCOUNTER — Other Ambulatory Visit: Payer: BLUE CROSS/BLUE SHIELD

## 2016-03-19 DIAGNOSIS — Z79899 Other long term (current) drug therapy: Secondary | ICD-10-CM

## 2016-03-19 DIAGNOSIS — E039 Hypothyroidism, unspecified: Secondary | ICD-10-CM

## 2016-03-19 DIAGNOSIS — E785 Hyperlipidemia, unspecified: Secondary | ICD-10-CM

## 2016-03-19 DIAGNOSIS — M8580 Other specified disorders of bone density and structure, unspecified site: Secondary | ICD-10-CM

## 2016-03-19 DIAGNOSIS — Z0000 Encounter for general adult medical examination without abnormal findings: Secondary | ICD-10-CM

## 2016-03-19 DIAGNOSIS — E119 Type 2 diabetes mellitus without complications: Secondary | ICD-10-CM

## 2016-03-19 LAB — CBC WITH DIFFERENTIAL/PLATELET
Basophils Absolute: 0 cells/uL (ref 0–200)
Basophils Relative: 0 %
EOS PCT: 3 %
Eosinophils Absolute: 159 cells/uL (ref 15–500)
HCT: 41.4 % (ref 35.0–45.0)
HEMOGLOBIN: 13.2 g/dL (ref 12.0–15.0)
LYMPHS ABS: 1537 {cells}/uL (ref 850–3900)
Lymphocytes Relative: 29 %
MCH: 28.2 pg (ref 27.0–33.0)
MCHC: 31.9 g/dL — AB (ref 32.0–36.0)
MCV: 88.5 fL (ref 80.0–100.0)
MPV: 11.6 fL (ref 7.5–12.5)
Monocytes Absolute: 477 cells/uL (ref 200–950)
Monocytes Relative: 9 %
NEUTROS ABS: 3127 {cells}/uL (ref 1500–7800)
NEUTROS PCT: 59 %
Platelets: 194 10*3/uL (ref 140–400)
RBC: 4.68 MIL/uL (ref 3.80–5.10)
RDW: 13.6 % (ref 11.0–15.0)
WBC: 5.3 10*3/uL (ref 3.8–10.8)

## 2016-03-19 LAB — COMPLETE METABOLIC PANEL WITH GFR
ALK PHOS: 56 U/L (ref 33–130)
ALT: 12 U/L (ref 6–29)
AST: 20 U/L (ref 10–35)
Albumin: 4 g/dL (ref 3.6–5.1)
BUN: 14 mg/dL (ref 7–25)
CHLORIDE: 109 mmol/L (ref 98–110)
CO2: 26 mmol/L (ref 20–31)
Calcium: 9.3 mg/dL (ref 8.6–10.4)
Creat: 0.88 mg/dL (ref 0.50–0.99)
GFR, EST NON AFRICAN AMERICAN: 71 mL/min (ref >=60)
GFR, Est African American: 81 mL/min (ref >=60)
GLUCOSE: 99 mg/dL (ref 70–99)
POTASSIUM: 4.9 mmol/L (ref 3.5–5.3)
SODIUM: 144 mmol/L (ref 135–146)
Total Bilirubin: 0.9 mg/dL (ref 0.2–1.2)
Total Protein: 6.4 g/dL (ref 6.1–8.1)

## 2016-03-19 LAB — LIPID PANEL
CHOL/HDL RATIO: 2.2 ratio (ref <5.0)
Cholesterol: 144 mg/dL (ref <200)
HDL: 65 mg/dL (ref >50)
LDL CALC: 65 mg/dL (ref <100)
TRIGLYCERIDES: 70 mg/dL (ref <150)
VLDL: 14 mg/dL (ref <30)

## 2016-03-19 LAB — HEMOGLOBIN A1C
HEMOGLOBIN A1C: 5.2 % (ref <5.7)
Mean Plasma Glucose: 103 mg/dL

## 2016-03-20 LAB — VITAMIN D 25 HYDROXY (VIT D DEFICIENCY, FRACTURES): VIT D 25 HYDROXY: 41 ng/mL (ref 30–100)

## 2016-03-20 LAB — TSH: TSH: 0.24 mIU/L — ABNORMAL LOW

## 2016-03-21 ENCOUNTER — Encounter: Payer: BLUE CROSS/BLUE SHIELD | Admitting: Physician Assistant

## 2016-03-22 ENCOUNTER — Ambulatory Visit (INDEPENDENT_AMBULATORY_CARE_PROVIDER_SITE_OTHER): Payer: BLUE CROSS/BLUE SHIELD | Admitting: Physician Assistant

## 2016-03-22 ENCOUNTER — Other Ambulatory Visit: Payer: Self-pay

## 2016-03-22 ENCOUNTER — Encounter: Payer: Self-pay | Admitting: Physician Assistant

## 2016-03-22 VITALS — BP 160/86 | HR 111 | Temp 98.3°F | Resp 18 | Wt 113.0 lb

## 2016-03-22 DIAGNOSIS — L720 Epidermal cyst: Secondary | ICD-10-CM | POA: Diagnosis not present

## 2016-03-22 DIAGNOSIS — E039 Hypothyroidism, unspecified: Secondary | ICD-10-CM | POA: Diagnosis not present

## 2016-03-22 DIAGNOSIS — Z0000 Encounter for general adult medical examination without abnormal findings: Secondary | ICD-10-CM

## 2016-03-22 DIAGNOSIS — R739 Hyperglycemia, unspecified: Secondary | ICD-10-CM | POA: Diagnosis not present

## 2016-03-22 DIAGNOSIS — E7800 Pure hypercholesterolemia, unspecified: Secondary | ICD-10-CM

## 2016-03-22 MED ORDER — LEVOTHYROXINE SODIUM 75 MCG PO TABS: 75.0000 ug | ORAL_TABLET | Freq: Every day | ORAL | 0 refills | Status: DC

## 2016-03-22 MED ORDER — ATORVASTATIN CALCIUM 10 MG PO TABS: 10.0000 mg | ORAL_TABLET | Freq: Every day | ORAL | 0 refills | Status: DC

## 2016-03-22 NOTE — Telephone Encounter (Signed)
Rx refilled per protocol 

## 2016-03-22 NOTE — Progress Notes (Signed)
Patient ID: Kathleen Mcclain MRN: [id number], DOB: 22-Jan-1951, 65 y.o. Date of Encounter: 03/22/2016,   Chief Complaint: Physical (CPE)  HPI: 65 y.o. y/o white female  here for CPE.    03/09/2015: She presents for CPE. She is also being seen as a new patient to establish care. She was going to the [facility] but since it has closed she is establishing care here.  She sees a gynecologist on a routine basis. States that she also is seen at [facility] urology because of history of kidney stones. Says that she now goes there every 2 years. States that she also sees ophthalmologist routinely Dr. Posey Pronto  She states that several years ago she was told that she was prediabetic and was informed regarding diet and exercise changes.  She states that she loved sweets and carbs and made changes and lost 30 pounds. She continues to be very careful with her diet but says that she does not exercise.  She does not work outside of the home. She states that she has 2 sets of grandchildren. One set lives nearby and the other set lives at [place].  She is taking cholesterol medication as directed. No myalgias or other adverse effects. She is taking her thyroid medication daily.   03/22/2016: She reports that she has had a cyst on her back for a long time and it had not been bothering her. However says that it is getting bigger to where it is pressing on her bra area and is getting uncomfortable. He someone he can remove that. She has no other concerns to address today. She is taking cholesterol medication as directed. No myalgias or other adverse effects. She is taking her thyroid medication daily. She continues to be careful with her diet and is eating low-cholesterol and low sugar diet.  Review of Systems: Consitutional: No fever, chills, fatigue, night sweats, lymphadenopathy. No significant/unexplained weight changes. Eyes: No visual changes, eye redness, or discharge. ENT/Mouth: No ear  pain, sore throat, nasal drainage, or sinus pain. Cardiovascular: No chest pressure,heaviness, tightness or squeezing, even with exertion. No increased shortness of breath or dyspnea on exertion.No palpitations, edema, orthopnea, PND. Respiratory: No cough, hemoptysis, SOB, or wheezing. Gastrointestinal: No anorexia, dysphagia, reflux, pain, nausea, vomiting, hematemesis, diarrhea, constipation, BRBPR, or melena. Breast: No mass, nodules, bulging, or retraction. No skin changes or inflammation. No nipple discharge. No lymphadenopathy. Genitourinary: No dysuria, hematuria, incontinence, vaginal discharge, pruritis, burning, abnormal bleeding, or pain. Musculoskeletal: No decreased ROM, No joint pain or swelling. No significant pain in neck, back, or extremities. Skin: No rash, pruritis. Neurological: No headache, dizziness, syncope, seizures, tremors, memory loss, coordination problems, or paresthesias. Psychological: No anxiety, depression, hallucinations, SI/HI. Endocrine: No polydipsia, polyphagia, polyuria, or known diabetes.No increased fatigue. No palpitations/rapid heart rate. No significant/unexplained weight change. All other systems were reviewed and are otherwise negative.  Past Medical History:  Diagnosis Date  . Allergy   . Cancer ([place])    pre cancer cells cervical  . Cataract   . Chronic kidney disease   . Diabetes mellitus without complication ([place])    pre diabetic  . Glaucoma   . Hypercholesteremia   . Hypothyroid   . Hypothyroidism   . Personal history of kidney stones      Past Surgical History:  Procedure Laterality Date  . CERVICAL BIOPSY  12/07   pre cancerous cells  . TUBAL LIGATION  5/86    Home Meds:  Outpatient Medications Prior to Visit  Medication Sig  Dispense Refill  . atorvastatin (LIPITOR) 10 MG tablet Take 1 tablet (10 mg total) by mouth daily. 30 tablet 0  . Carboxymeth-Glycerin-Polysorb (REFRESH OPTIVE ADVANCED) 0.5-1-0.5 % SOLN Place 1 drop  into both eyes as needed.    . dorzolamide (TRUSOPT) 2 % ophthalmic solution Place 1 drop into both eyes 2 (two) times daily.    Marland Kitchen ketotifen (ZADITOR) 0.025 % ophthalmic solution Place 1 drop into both eyes as needed.    Marland Kitchen levothyroxine (SYNTHROID, LEVOTHROID) 75 MCG tablet Take 1 tablet (75 mcg total) by mouth daily. 30 tablet 0  . Multiple Vitamin (MULTIVITAMIN) tablet Take 1 tablet by mouth daily.    . Travoprost, BAK Free, (TRAVATAN) 0.004 % SOLN ophthalmic solution Place 1 drop into both eyes at bedtime.    . Estradiol (VAGIFEM) 10 MCG TABS Place 1 tablet vaginally 2 (two) times a week. Monday and wednesdays      No facility-administered medications prior to visit.     Allergies:  Allergies  Allergen Reactions  . Alphagan [Brimonidine] Other (See Comments)    Opthalmic solution    Red itchy eyes  . Penicillins Swelling    Social History   Social History  . Marital status: Married    Spouse name: N/A  . Number of children: N/A  . Years of education: N/A   Occupational History  . Not on file.   Social History Main Topics  . Smoking status: Never Smoker  . Smokeless tobacco: Never Used  . Alcohol use No  . Drug use: No  . Sexual activity: Not on file   Other Topics Concern  . Not on file   Social History Narrative   Does not work outside of home.    Has 2 sets of grandchildren--1 set lives in this area. 1 set lives at [place] [employer].   Approximately 2013-- Was told "PreDiabetic"---She lost 30 pounds. Very careful with diet now. Does not exercise.    Family History  Problem Relation Age of Onset  . Alzheimer's disease Mother   . Cancer Father     Pancreatic Cancer  . Hyperlipidemia Father   . Arthritis Maternal Grandmother   . Diabetes Maternal Grandfather   . Stroke Maternal Grandfather   . Cancer Paternal Grandmother   . Cancer Paternal Grandfather     Physical Exam: Blood pressure (!) 152/86, 92- pulse , temperature 98.3 F (36.8 C), temperature source  Oral, resp. rate 18, weight 113 lb (51.3 kg), SpO2 98 %., Body mass index is 18.8 kg/m. General: Well developed, well nourished, WF. Appears in no acute distress. HEENT: Normocephalic, atraumatic. Conjunctiva pink, sclera non-icteric. Pupils 2 mm constricting to 1 mm, round, regular, and equally reactive to light and accomodation. EOMI. Internal auditory canal clear. TMs with good cone of light and without pathology. Nasal mucosa pink. Nares are without discharge. No sinus tenderness. Oral mucosa pink.  Pharynx without exudate.   Neck: Supple. Trachea midline. No thyromegaly. Full ROM. No lymphadenopathy.No Carotid Bruits. Lungs: Clear to auscultation bilaterally without wheezes, rales, or rhonchi. Breathing is of normal effort and unlabored. Cardiovascular: RRR with S1 S2. No murmurs, rubs, or gallops. Distal pulses 2+ symmetrically. No carotid or abdominal bruits. Breast: Per Gyn Abdomen: Soft, non-tender, non-distended with normoactive bowel sounds. No hepatosplenomegaly or masses. No rebound/guarding. No CVA tenderness. No hernias.  Genitourinary: Per Gyn. Musculoskeletal: Full range of motion and 5/5 strength throughout.  Skin: Warm and moist without erythema, ecchymosis, wounds, or rash. Right side of upper back---There is ~  1 inch diameter cyst under the skin. This is adjacen to bra strap and is pressing against bra strap. Neuro: A+Ox3. CN II-XII grossly intact. Moves all extremities spontaneously. Full sensation throughout. Normal gait. DTR 2+ throughout upper and lower extremities.  Psych:  Responds to questions appropriately with a normal affect.   Assessment/Plan:  65 y.o. y/o female here for CPE  1. Visit for preventive health examination  A. Screening Labs: She recently came and had fasting labs. Reviewed those results today.  She was concerned about her sugar but I reassured her that her glucose and A1c are now reading normal.  B. Pap: This is managed by GYN. She states her  next appointment there is in December.  C. Screening Mammogram: This is managed by GYN. She states her next appointment there is in December.  D. DEXA/BMD:  At her CPE 94--we discussed Fosamax-- Discussed that she had been on Fosamax in the past but it had been discontinued. She said that this was stopped around the same time that she was having kidney stones and thought that it was stopped because it was affecting her calcium and kidney stones 02/2016--- her prior [facility] scan from[facility] is now showing up in epic this was performed 12/16/2009 and showed T-scores of - 2.4 and -2.0.  E. Colorectal Cancer Screening: She states that she has had one colonoscopy performed in the past and her next one is due in about 1 or 2 more years. She states that her husband had his colonoscopy by the same GI doctor and that the GI doctor does send a reminder because her husband just recently got his reminder and she knows that her colonoscopy is due 1 or 2 years after his.  F. Immunizations:  Influenza: ---Given here 03/09/15. She has already received her flu vaccine here for the 2017 season. Tetanus:  02/2015--she reported: She states that she remembers when her grandchildren were babies she got the pertussis vaccine so it sounds like she would've gotten the T dap and this will be up to date. Pneumococcal:  She has no indication to require the pneumonia vaccines until age 46 Zostavax:  Given here 03/09/15   2. Hyperglycemia 02/2015: When she was diagnosed with this 2 years ago she lost 30 pounds. She is very compliant with diet. Says she is aware she is supposed to be exercising but says not do routine exercise.  Will check lab to monitor.  - Hemoglobin A1c 02/2016: Reassured patient that glucose and A1c recently performed  now normal. Continue diet changes.  3. Hypercholesteremia She states that her prior PCP was doing office visits and labs only once per year. She is on Lipitor 10 mg.    02/2016--lipid panel excellent. LFTs normal. Continue current dose Lipitor.  4. Hypothyroidism, unspecified hypothyroidism type 02/2015: She states that her prior PCP was doing office visits and labs only once per year.  03/22/2016: She is on levothyroxine 75 g daily.   Recent TSH slightly out of normal range, but stable and barely out of normal range---cont current dose of thyroid medication.  - TSH   5. White coat hypertension 02/2015: She states that she just bought a new blood pressure cuff for one year ago and knows that it reads accurately. States that at home she gets systolics [id number] to [id number].  She will continue to monitor this at home. If readings do increase >140/90, she will follow-up with [place]. 02/2016: Discussed that her blood pressure and heart rate are running on the  high side again today. She states that she has been checking this with her cuff at home and getting good readings. She states that her son's girlfriend is in nursing school and she will have her check heart rate and blood pressure several times and will follow up with [place] if they are running high.    Cyst of skin Will refer to Derm for excision - Ambulatory referral to Dermatology  Her prior PCP was doing office visits and labs only once per year. Will plan follow-up in one year or sooner if needed.   Marin Olp Mettler, [place], [facility] 03/22/2016 3:07 PM

## 2016-04-13 ENCOUNTER — Other Ambulatory Visit: Payer: Self-pay | Admitting: Physician Assistant

## 2016-04-30 LAB — HM MAMMOGRAPHY

## 2016-06-14 ENCOUNTER — Other Ambulatory Visit: Payer: Self-pay

## 2016-06-14 MED ORDER — LEVOTHYROXINE SODIUM 75 MCG PO TABS: 75.0000 ug | ORAL_TABLET | Freq: Every day | ORAL | 0 refills | Status: DC

## 2016-06-14 NOTE — Telephone Encounter (Signed)
Refill appropriate 

## 2016-06-15 ENCOUNTER — Other Ambulatory Visit: Payer: Self-pay

## 2016-06-21 ENCOUNTER — Other Ambulatory Visit: Payer: Self-pay | Admitting: Physician Assistant

## 2016-06-21 NOTE — Telephone Encounter (Signed)
Refill appropriate 

## 2016-08-24 ENCOUNTER — Other Ambulatory Visit (HOSPITAL_COMMUNITY): Payer: Self-pay | Admitting: Oculoplastics Ophthalmology

## 2016-08-24 DIAGNOSIS — H04552 Acquired stenosis of left nasolacrimal duct: Secondary | ICD-10-CM

## 2016-08-31 ENCOUNTER — Ambulatory Visit (HOSPITAL_COMMUNITY)
Admission: RE | Admit: 2016-08-31 | Discharge: 2016-08-31 | Disposition: A | Discharge status: Home / Self Care | Payer: BLUE CROSS/BLUE SHIELD | Source: Ambulatory Visit | Attending: Oculoplastics Ophthalmology | Admitting: Oculoplastics Ophthalmology

## 2016-08-31 DIAGNOSIS — H04552 Acquired stenosis of left nasolacrimal duct: Secondary | ICD-10-CM | POA: Diagnosis not present

## 2016-08-31 DIAGNOSIS — H04559 Acquired stenosis of unspecified nasolacrimal duct: Secondary | ICD-10-CM | POA: Diagnosis present

## 2016-09-10 ENCOUNTER — Other Ambulatory Visit: Payer: Self-pay | Admitting: Physician Assistant

## 2016-09-10 NOTE — Telephone Encounter (Signed)
Refill appropriate 

## 2016-09-19 ENCOUNTER — Encounter: Payer: Self-pay | Admitting: Physician Assistant

## 2016-09-19 ENCOUNTER — Ambulatory Visit (INDEPENDENT_AMBULATORY_CARE_PROVIDER_SITE_OTHER): Payer: BLUE CROSS/BLUE SHIELD | Admitting: Physician Assistant

## 2016-09-19 VITALS — BP 178/92 | HR 86 | Temp 98.2°F | Resp 16 | Wt 118.0 lb

## 2016-09-19 DIAGNOSIS — E7800 Pure hypercholesterolemia, unspecified: Secondary | ICD-10-CM

## 2016-09-19 DIAGNOSIS — E039 Hypothyroidism, unspecified: Secondary | ICD-10-CM | POA: Diagnosis not present

## 2016-09-19 DIAGNOSIS — R739 Hyperglycemia, unspecified: Secondary | ICD-10-CM | POA: Diagnosis not present

## 2016-09-19 LAB — COMPLETE METABOLIC PANEL WITH GFR
ALT: 11 U/L (ref 6–29)
AST: 14 U/L (ref 10–35)
Albumin: 4.3 g/dL (ref 3.6–5.1)
Alkaline Phosphatase: 79 U/L (ref 33–130)
BILIRUBIN TOTAL: 1.1 mg/dL (ref 0.2–1.2)
BUN: 11 mg/dL (ref 7–25)
CALCIUM: 9.8 mg/dL (ref 8.6–10.4)
CHLORIDE: 111 mmol/L — AB (ref 98–110)
CO2: 22 mmol/L (ref 20–31)
Creat: 0.85 mg/dL (ref 0.50–0.99)
GFR, EST AFRICAN AMERICAN: 84 mL/min (ref >=60)
GFR, EST NON AFRICAN AMERICAN: 73 mL/min (ref >=60)
Glucose, Bld: 95 mg/dL (ref 70–99)
Potassium: 5.2 mmol/L (ref 3.5–5.3)
Sodium: 144 mmol/L (ref 135–146)
Total Protein: 6.8 g/dL (ref 6.1–8.1)

## 2016-09-19 LAB — LIPID PANEL
CHOLESTEROL: 154 mg/dL (ref <200)
HDL: 73 mg/dL (ref >50)
LDL Cholesterol: 69 mg/dL (ref <100)
TRIGLYCERIDES: 62 mg/dL (ref <150)
Total CHOL/HDL Ratio: 2.1 Ratio (ref <5.0)
VLDL: 12 mg/dL (ref <30)

## 2016-09-19 LAB — TSH: TSH: 0.13 mIU/L — ABNORMAL LOW

## 2016-09-19 NOTE — Progress Notes (Signed)
Patient ID: Kathleen Mcclain MRN: [id number], DOB: Sep 01, 1950, 66 y.o. Date of Encounter: 09/19/2016,   Chief Complaint: Physical (CPE)  HPI: 66 y.o. y/o white female  here for CPE.    03/09/2015: She presents for CPE. She is also being seen as a new patient to establish care. She was going to the [facility] but since it has closed she is establishing care here.  She sees a gynecologist on a routine basis. States that she also is seen at [facility] urology because of history of kidney stones. Says that she now goes there every 2 years. States that she also sees ophthalmologist routinely Dr. Posey Pronto  She states that several years ago she was told that she was prediabetic and was informed regarding diet and exercise changes.  She states that she loved sweets and carbs and made changes and lost 30 pounds. She continues to be very careful with her diet but says that she does not exercise.  She does not work outside of the home. She states that she has 2 sets of grandchildren. One set lives nearby and the other set lives at [place].  She is taking cholesterol medication as directed. No myalgias or other adverse effects. She is taking her thyroid medication daily.   03/22/2016: She reports that she has had a cyst on her back for a long time and it had not been bothering her. However says that it is getting bigger to where it is pressing on her bra area and is getting uncomfortable. He someone he can remove that. She has no other concerns to address today. She is taking cholesterol medication as directed. No myalgias or other adverse effects. She is taking her thyroid medication daily. She continues to be careful with her diet and is eating low-cholesterol and low sugar diet.  09/19/2016: She is taking cholesterol medication as directed. No myalgias or other adverse effects. She is taking her thyroid medication daily. She continues to be careful with her diet and is eating  low-cholesterol and low sugar diet. She continues to check her blood pressure at home and gets 409W systolic. Is not getting any high readings at home. She has no other concerns to address today.   Review of Systems: Consitutional: No fever, chills, fatigue, night sweats, lymphadenopathy. No significant/unexplained weight changes. Eyes: No visual changes, eye redness, or discharge. ENT/Mouth: No ear pain, sore throat, nasal drainage, or sinus pain. Cardiovascular: No chest pressure,heaviness, tightness or squeezing, even with exertion. No increased shortness of breath or dyspnea on exertion.No palpitations, edema, orthopnea, PND. Respiratory: No cough, hemoptysis, SOB, or wheezing. Gastrointestinal: No anorexia, dysphagia, reflux, pain, nausea, vomiting, hematemesis, diarrhea, constipation, BRBPR, or melena. Breast: No mass, nodules, bulging, or retraction. No skin changes or inflammation. No nipple discharge. No lymphadenopathy. Genitourinary: No dysuria, hematuria, incontinence, vaginal discharge, pruritis, burning, abnormal bleeding, or pain. Musculoskeletal: No decreased ROM, No joint pain or swelling. No significant pain in neck, back, or extremities. Skin: No rash, pruritis. Neurological: No headache, dizziness, syncope, seizures, tremors, memory loss, coordination problems, or paresthesias. Psychological: No anxiety, depression, hallucinations, SI/HI. Endocrine: No polydipsia, polyphagia, polyuria, or known diabetes.No increased fatigue. No palpitations/rapid heart rate. No significant/unexplained weight change. All other systems were reviewed and are otherwise negative.  Past Medical History:  Diagnosis Date  . Allergy   . Cancer ([place])    pre cancer cells cervical  . Cataract   . Chronic kidney disease   . Diabetes mellitus without complication ([place])  pre diabetic  . Glaucoma   . Hypercholesteremia   . Hypothyroid   . Hypothyroidism   . Personal history of kidney stones        Past Surgical History:  Procedure Laterality Date  . CERVICAL BIOPSY  12/07   pre cancerous cells  . TUBAL LIGATION  5/86    Home Meds:  Outpatient Medications Prior to Visit  Medication Sig Dispense Refill  . atorvastatin (LIPITOR) 10 MG tablet TAKE 1 TABLET (10 MG TOTAL) BY MOUTH DAILY. 30 tablet 0  . atorvastatin (LIPITOR) 10 MG tablet TAKE 1 TABLET DAILY 90 tablet 0  . Carboxymeth-Glycerin-Polysorb (REFRESH OPTIVE ADVANCED) 0.5-1-0.5 % SOLN Place 1 drop into both eyes as needed.    . dorzolamide (TRUSOPT) 2 % ophthalmic solution Place 1 drop into both eyes 2 (two) times daily.    . Estradiol (VAGIFEM) 10 MCG TABS Place 1 tablet vaginally 2 (two) times a week. Monday and wednesdays     . ketotifen (ZADITOR) 0.025 % ophthalmic solution Place 1 drop into both eyes as needed.    Marland Kitchen levothyroxine (SYNTHROID, LEVOTHROID) 75 MCG tablet Take 1 tablet (75 mcg total) by mouth daily. 90 tablet 0  . levothyroxine (SYNTHROID, LEVOTHROID) 75 MCG tablet TAKE 1 TABLET (75 MCG TOTAL) BY MOUTH DAILY. 90 tablet 0  . Multiple Vitamin (MULTIVITAMIN) tablet Take 1 tablet by mouth daily.    . Travoprost, BAK Free, (TRAVATAN) 0.004 % SOLN ophthalmic solution Place 1 drop into both eyes at bedtime.     No facility-administered medications prior to visit.     Allergies:  Allergies  Allergen Reactions  . Alphagan [Brimonidine] Other (See Comments)    Opthalmic solution    Red itchy eyes  . Penicillins Swelling    Social History   Social History  . Marital status: Married    Spouse name: N/A  . Number of children: N/A  . Years of education: N/A   Occupational History  . Not on file.   Social History Main Topics  . Smoking status: Never Smoker  . Smokeless tobacco: Never Used  . Alcohol use No  . Drug use: No  . Sexual activity: Not on file   Other Topics Concern  . Not on file   Social History Narrative   Does not work outside of home.    Has 2 sets of grandchildren--1 set lives  in this area. 1 set lives at [place].   Approximately 2013-- Was told "PreDiabetic"---She lost 30 pounds. Very careful with diet now. Does not exercise.    Family History  Problem Relation Age of Onset  . Alzheimer's disease Mother   . Cancer Father        Pancreatic Cancer  . Hyperlipidemia Father   . Arthritis Maternal Grandmother   . Diabetes Maternal Grandfather   . Stroke Maternal Grandfather   . Cancer Paternal Grandmother   . Cancer Paternal Grandfather     Physical Exam: Blood pressure (!) 152/86, 92- pulse , temperature 98.3 F (36.8 C), temperature source Oral, resp. rate 18, weight 113 lb (51.3 kg), SpO2 98 %., Body mass index is 19.64 kg/m. General: Well developed, well nourished, WF. Appears in no acute distress. Neck: Supple. Trachea midline. No thyromegaly. Full ROM. No lymphadenopathy.No Carotid Bruits. Lungs: Clear to auscultation bilaterally without wheezes, rales, or rhonchi. Breathing is of normal effort and unlabored. Cardiovascular: RRR with S1 S2. No murmurs, rubs, or gallops.  Abdomen: Soft, non-tender, non-distended with normoactive bowel sounds. No hepatosplenomegaly or  masses. No rebound/guarding.  Musculoskeletal: Full range of motion and 5/5 strength throughout.  Skin: Warm and moist without erythema, ecchymosis, wounds, or rash. Neuro: A+Ox3. CN II-XII grossly intact. Moves all extremities spontaneously. Full sensation throughout. Normal gait.  Psych:  Responds to questions appropriately with a normal affect.   Assessment/Plan:  66 y.o. y/o female here for    Hyperglycemia 02/2015: When she was diagnosed with this 2 years ago she lost 30 pounds. She is very compliant with diet. Says she is aware she is supposed to be exercising but says not do routine exercise.  Will check lab to monitor.  - Hemoglobin A1c 02/2016: Reassured patient that glucose and A1c recently performed  now normal. Continue diet changes. 09/19/2016----A1Cs checked since  transferring her care to me have been normal. She has maintained weight loss. Will not recheck A1C now unless Glucose comes back high   Hypercholesteremia She states that her prior PCP was doing office visits and labs only once per year. She is on Lipitor 10 mg.  09/19/2016: She is fasting. Recheck FLP and LFT to monitor.   Hypothyroidism, unspecified hypothyroidism type 02/2015: She states that her prior PCP was doing office visits and labs only once per year.  03/22/2016: She is on levothyroxine 75 g daily.   Recent TSH slightly out of normal range, but stable and barely out of normal range---cont current dose of thyroid medication.  - TSH 09/19/2016----recheck TSH to monitor.   White coat hypertension 02/2015: She states that she just bought a new blood pressure cuff for one year ago and knows that it reads accurately. States that at home she gets systolics 259D to 638V.  She will continue to monitor this at home. If readings do increase >140/90, she will follow-up with [place]. 02/2016: Discussed that her blood pressure and heart rate are running on the high side again today. She states that she has been checking this with her cuff at home and getting good readings. She states that her son's girlfriend is in nursing school and she will have her check heart rate and blood pressure several times and will follow up with [place] if they are running high. 09/19/2016---Discussed that again blood pressure is reading high here but she states that she continues to check blood pressure routinely and is getting good readings.      -----------------THE FOLLOWING IS COPIED FROM HER CPE 03/22/2016------IS NOT ADDRESSED AT OV 09/19/2016-----------------------------  Visit for preventive health examination  A. Screening Labs: She recently came and had fasting labs. Reviewed those results today.  She was concerned about her sugar but I reassured her that her glucose and A1c are now reading normal.  B.  Pap: This is managed by GYN. She states her next appointment there is in December.  C. Screening Mammogram: This is managed by GYN. She states her next appointment there is in December.  D. DEXA/BMD:  At her CPE 2--we discussed Fosamax-- Discussed that she had been on Fosamax in the past but it had been discontinued. She said that this was stopped around the same time that she was having kidney stones and thought that it was stopped because it was affecting her calcium and kidney stones 02/2016--- her prior [facility] scan from[facility] is now showing up in epic this was performed 12/16/2009 and showed T-scores of - 2.4 and -2.0.  E. Colorectal Cancer Screening: She states that she has had one colonoscopy performed in the past and her next one is due in about 1  or 2 more years. She states that her husband had his colonoscopy by the same GI doctor and that the GI doctor does send a reminder because her husband just recently got his reminder and she knows that her colonoscopy is due 1 or 2 years after his.  F. Immunizations:  Influenza: ---Given here 03/09/15. She has already received her flu vaccine here for the 2017 season. Tetanus:  02/2015--she reported: She states that she remembers when her grandchildren were babies she got the pertussis vaccine so it sounds like she would've gotten the T dap and this will be up to date. Pneumococcal:  She has no indication to require the pneumonia vaccines until age 4 Zostavax:  Given here 03/09/15    Her prior PCP was doing office visits and labs only once per year. Will plan follow-up in one year or sooner if needed.   Signed, [address] [place], [place], [facility] 09/19/2016 9:32 AM

## 2016-09-21 ENCOUNTER — Other Ambulatory Visit: Payer: Self-pay

## 2016-09-21 MED ORDER — LEVOTHYROXINE SODIUM 50 MCG PO TABS: 50.0000 ug | ORAL_TABLET | Freq: Every day | ORAL | 0 refills | Status: DC

## 2016-09-25 ENCOUNTER — Other Ambulatory Visit: Payer: Self-pay

## 2016-09-25 DIAGNOSIS — E039 Hypothyroidism, unspecified: Secondary | ICD-10-CM

## 2016-11-02 ENCOUNTER — Other Ambulatory Visit: Payer: BLUE CROSS/BLUE SHIELD

## 2016-11-02 DIAGNOSIS — E039 Hypothyroidism, unspecified: Secondary | ICD-10-CM

## 2016-11-02 LAB — TSH: TSH: 5.14 mIU/L — ABNORMAL HIGH

## 2016-11-07 ENCOUNTER — Other Ambulatory Visit: Payer: Self-pay

## 2016-11-07 DIAGNOSIS — E039 Hypothyroidism, unspecified: Secondary | ICD-10-CM

## 2016-12-07 ENCOUNTER — Other Ambulatory Visit: Payer: Self-pay | Admitting: Physician Assistant

## 2016-12-07 NOTE — Telephone Encounter (Signed)
Refill appropriate 

## 2016-12-18 ENCOUNTER — Other Ambulatory Visit: Payer: Self-pay | Admitting: Physician Assistant

## 2016-12-18 NOTE — Telephone Encounter (Signed)
Refill appropriate 

## 2017-01-30 ENCOUNTER — Ambulatory Visit (INDEPENDENT_AMBULATORY_CARE_PROVIDER_SITE_OTHER): Payer: BLUE CROSS/BLUE SHIELD

## 2017-01-30 DIAGNOSIS — Z23 Encounter for immunization: Secondary | ICD-10-CM | POA: Diagnosis not present

## 2017-01-30 NOTE — Progress Notes (Signed)
patient was seen in office for flu vaccine. Patient received vaccine in right deltoid. Patient tolerated well

## 2017-02-19 ENCOUNTER — Encounter (HOSPITAL_COMMUNITY): Payer: Self-pay | Admitting: *Deleted

## 2017-02-19 NOTE — Progress Notes (Signed)
Spoke with pt for pre-op call. Pt denies cardiac history, chest pain or sob. Pt states she has never been diagnosed with HTN, but states her blood pressure always goes up when she comes to any medical appt. Pt states she is pre-diabetic. Last A1C was 5.2 on 03/19/16. She states she does not check her blood sugar at home.

## 2017-02-19 NOTE — H&P (Signed)
Subjective:    Kathleen Mcclain is a 66 y.o. female who presents for evaluation of nasolacrimal duct obstruction. The pain is described as none. Onset was several years ago. Symptoms have been unchanged since.   Review of Systems Pertinent items are noted in HPI.    Objective:   There were no vitals taken for this visit.  General:  alert, cooperative and appears stated age Skin:  normal Eyes: conjunctivae/corneas clear. PERRL, EOM's intact. Fundi benign., VA 20/20 od 20/30 os no apd, nldo os Mouth: MMM no lesions Lymph Nodes:  Cervical, supraclavicular, and axillary nodes normal. Lungs:  clear to auscultation bilaterally Heart:  regular rate and rhythm, S1, S2 normal, no murmur, click, rub or gallop Abdomen: soft, non-tender; bowel sounds normal; no masses,  no organomegaly CVA:  absent Genitourinary: defer exam Extremities:  extremities normal, atraumatic, no cyanosis or edema Neurologic:  negative Psychiatric:  normal mood, behavior, speech, dress, and thought processes    Assessment: Acquired Nasolacrimal Duct Obstruction LEFT SIDE   Plan: Dacrycystorhinostomy, Anterior Ethmoidectomy, Punctopexy, probing, with stent placement left side  1. Discussed the risk of surgery,  and the risks of general anesthetic including MI, CVA, sudden death or even reaction to anesthetic medications. The patient understands the risks, any and all questions were answered to the patient's satisfaction. 2. Follow up: 1 day.  Date of Surgery Update (To be completed by Attending Surgeon day of surgery.)

## 2017-02-20 ENCOUNTER — Ambulatory Visit (HOSPITAL_COMMUNITY): Payer: BLUE CROSS/BLUE SHIELD | Admitting: Anesthesiology

## 2017-02-20 ENCOUNTER — Ambulatory Visit (HOSPITAL_COMMUNITY)
Admission: RE | Admit: 2017-02-20 | Discharge: 2017-02-20 | Disposition: A | Discharge status: Home / Self Care | Payer: BLUE CROSS/BLUE SHIELD | Source: Ambulatory Visit | Attending: Oculoplastics Ophthalmology | Admitting: Oculoplastics Ophthalmology

## 2017-02-20 ENCOUNTER — Encounter (HOSPITAL_COMMUNITY)
Admission: RE | Disposition: A | Discharge status: Home / Self Care | Payer: Self-pay | Source: Ambulatory Visit | Attending: Oculoplastics Ophthalmology

## 2017-02-20 ENCOUNTER — Encounter (HOSPITAL_COMMUNITY): Payer: Self-pay | Admitting: Urology

## 2017-02-20 DIAGNOSIS — H04512 Dacryolith of left lacrimal passage: Secondary | ICD-10-CM | POA: Diagnosis not present

## 2017-02-20 DIAGNOSIS — Z79899 Other long term (current) drug therapy: Secondary | ICD-10-CM | POA: Diagnosis not present

## 2017-02-20 DIAGNOSIS — I10 Essential (primary) hypertension: Secondary | ICD-10-CM | POA: Insufficient documentation

## 2017-02-20 HISTORY — DX: Personal history of urinary calculi: Z87.442

## 2017-02-20 HISTORY — PX: TEAR DUCT PROBING: SHX793

## 2017-02-20 HISTORY — DX: Prediabetes: R73.03

## 2017-02-20 HISTORY — DX: Unspecified osteoarthritis, unspecified site: M19.90

## 2017-02-20 LAB — CBC
HCT: 42.1 % (ref 36.0–46.0)
Hemoglobin: 13.8 g/dL (ref 12.0–15.0)
MCH: 29.1 pg (ref 26.0–34.0)
MCHC: 32.8 g/dL (ref 30.0–36.0)
MCV: 88.8 fL (ref 78.0–100.0)
PLATELETS: 178 10*3/uL (ref 150–400)
RBC: 4.74 MIL/uL (ref 3.87–5.11)
RDW: 13.1 % (ref 11.5–15.5)
WBC: 9.1 10*3/uL (ref 4.0–10.5)

## 2017-02-20 LAB — BASIC METABOLIC PANEL
Anion gap: 8 (ref 5–15)
BUN: 12 mg/dL (ref 6–20)
CALCIUM: 9.6 mg/dL (ref 8.9–10.3)
CHLORIDE: 107 mmol/L (ref 101–111)
CO2: 26 mmol/L (ref 22–32)
CREATININE: 0.87 mg/dL (ref 0.44–1.00)
Glucose, Bld: 108 mg/dL — ABNORMAL HIGH (ref 65–99)
Potassium: 4.8 mmol/L (ref 3.5–5.1)
SODIUM: 141 mmol/L (ref 135–145)

## 2017-02-20 LAB — PROTIME-INR
INR: 1
PROTHROMBIN TIME: 13.1 s (ref 11.4–15.2)

## 2017-02-20 SURGERY — PROBING, LACRIMAL DUCT
Anesthesia: General | Site: Eye | Laterality: Left

## 2017-02-20 MED ORDER — PROPOFOL 10 MG/ML IV BOLUS
INTRAVENOUS | Status: DC | PRN
  Administered 2017-02-20: 120 mg via INTRAVENOUS

## 2017-02-20 MED ORDER — HYDROCODONE-ACETAMINOPHEN 5-325 MG PO TABS: 1.0000 | ORAL_TABLET | Freq: Four times a day (QID) | ORAL | 0 refills | Status: AC | PRN

## 2017-02-20 MED ORDER — ONDANSETRON HCL 4 MG/2ML IJ SOLN
INTRAMUSCULAR | Status: DC | PRN
  Administered 2017-02-20: 4 mg via INTRAVENOUS

## 2017-02-20 MED ORDER — LIDOCAINE HCL 1 % IJ SOLN
INTRAMUSCULAR | Status: AC
  Filled 2017-02-20: qty 20

## 2017-02-20 MED ORDER — MIDAZOLAM HCL 5 MG/5ML IJ SOLN
INTRAMUSCULAR | Status: DC | PRN
  Administered 2017-02-20: 2 mg via INTRAVENOUS

## 2017-02-20 MED ORDER — FENTANYL CITRATE (PF) 250 MCG/5ML IJ SOLN
INTRAMUSCULAR | Status: AC
  Filled 2017-02-20: qty 5

## 2017-02-20 MED ORDER — PHENYLEPHRINE HCL 10 MG/ML IJ SOLN
INTRAMUSCULAR | Status: DC | PRN
  Administered 2017-02-20: 50 ug/min via INTRAVENOUS

## 2017-02-20 MED ORDER — SUGAMMADEX SODIUM 200 MG/2ML IV SOLN
INTRAVENOUS | Status: DC | PRN
  Administered 2017-02-20: 200 mg via INTRAVENOUS

## 2017-02-20 MED ORDER — MIDAZOLAM HCL 2 MG/2ML IJ SOLN
INTRAMUSCULAR | Status: AC
  Filled 2017-02-20: qty 2

## 2017-02-20 MED ORDER — GENTAMICIN SULFATE 40 MG/ML IJ SOLN
5.0000 mg/kg | Freq: Once | INTRAVENOUS | Status: AC
  Administered 2017-02-20: 270 mg via INTRAVENOUS
  Filled 2017-02-20: qty 6.75

## 2017-02-20 MED ORDER — LIDOCAINE HCL 1 % IJ SOLN
INTRAMUSCULAR | Status: DC | PRN
  Administered 2017-02-20: 5 mL via INTRAMUSCULAR

## 2017-02-20 MED ORDER — ONDANSETRON HCL 4 MG/2ML IJ SOLN
INTRAMUSCULAR | Status: AC
  Filled 2017-02-20: qty 2

## 2017-02-20 MED ORDER — FENTANYL CITRATE (PF) 100 MCG/2ML IJ SOLN
INTRAMUSCULAR | Status: DC | PRN
  Administered 2017-02-20: 50 ug via INTRAVENOUS
  Administered 2017-02-20: 100 ug via INTRAVENOUS

## 2017-02-20 MED ORDER — EPHEDRINE SULFATE 50 MG/ML IJ SOLN
INTRAMUSCULAR | Status: DC | PRN
  Administered 2017-02-20 (×3): 10 mg via INTRAVENOUS

## 2017-02-20 MED ORDER — OXYMETAZOLINE HCL 0.05 % NA SOLN
NASAL | Status: DC | PRN
  Administered 2017-02-20: 1 via TOPICAL

## 2017-02-20 MED ORDER — LIDOCAINE 2% (20 MG/ML) 5 ML SYRINGE
INTRAMUSCULAR | Status: DC | PRN
  Administered 2017-02-20: 60 mg via INTRAVENOUS

## 2017-02-20 MED ORDER — TOBRAMYCIN-DEXAMETHASONE 0.3-0.1 % OP OINT
TOPICAL_OINTMENT | OPHTHALMIC | Status: AC
  Filled 2017-02-20: qty 3.5

## 2017-02-20 MED ORDER — HEMOSTATIC AGENTS (NO CHARGE) OPTIME
TOPICAL | Status: DC | PRN
  Administered 2017-02-20: 1 via TOPICAL

## 2017-02-20 MED ORDER — OXYMETAZOLINE HCL 0.05 % NA SOLN
NASAL | Status: AC
  Filled 2017-02-20: qty 15

## 2017-02-20 MED ORDER — DEXAMETHASONE SODIUM PHOSPHATE 10 MG/ML IJ SOLN
INTRAMUSCULAR | Status: AC
  Filled 2017-02-20: qty 1

## 2017-02-20 MED ORDER — DEXAMETHASONE SODIUM PHOSPHATE 4 MG/ML IJ SOLN
INTRAMUSCULAR | Status: DC | PRN
  Administered 2017-02-20: 10 mg via INTRAVENOUS

## 2017-02-20 MED ORDER — LACTATED RINGERS IV SOLN
INTRAVENOUS | Status: DC | PRN
  Administered 2017-02-20 (×2): via INTRAVENOUS

## 2017-02-20 MED ORDER — SODIUM CHLORIDE 0.9 % IJ SOLN
INTRAMUSCULAR | Status: DC | PRN
  Administered 2017-02-20: 10 mL

## 2017-02-20 MED ORDER — EPINEPHRINE HCL (NASAL) 0.1 % NA SOLN
NASAL | Status: AC
  Filled 2017-02-20: qty 30

## 2017-02-20 MED ORDER — ROCURONIUM BROMIDE 10 MG/ML (PF) SYRINGE
PREFILLED_SYRINGE | INTRAVENOUS | Status: AC
  Filled 2017-02-20: qty 5

## 2017-02-20 MED ORDER — SODIUM CHLORIDE 0.9 % IJ SOLN
INTRAMUSCULAR | Status: AC
  Filled 2017-02-20: qty 10

## 2017-02-20 MED ORDER — GLYCOPYRROLATE 0.2 MG/ML IJ SOLN
INTRAMUSCULAR | Status: DC | PRN
  Administered 2017-02-20: 0.2 mg via INTRAVENOUS

## 2017-02-20 MED ORDER — SUGAMMADEX SODIUM 200 MG/2ML IV SOLN
INTRAVENOUS | Status: AC
  Filled 2017-02-20: qty 2

## 2017-02-20 MED ORDER — BUPIVACAINE HCL (PF) 0.5 % IJ SOLN
INTRAMUSCULAR | Status: AC
  Filled 2017-02-20: qty 30

## 2017-02-20 MED ORDER — ROCURONIUM BROMIDE 100 MG/10ML IV SOLN
INTRAVENOUS | Status: DC | PRN
  Administered 2017-02-20: 40 mg via INTRAVENOUS
  Administered 2017-02-20: 10 mg via INTRAVENOUS

## 2017-02-20 MED ORDER — TOBRAMYCIN-DEXAMETHASONE 0.3-0.1 % OP OINT
TOPICAL_OINTMENT | OPHTHALMIC | Status: DC | PRN
  Administered 2017-02-20: 1 via OPHTHALMIC

## 2017-02-20 MED ORDER — LIDOCAINE 2% (20 MG/ML) 5 ML SYRINGE
INTRAMUSCULAR | Status: AC
  Filled 2017-02-20: qty 5

## 2017-02-20 MED ORDER — PROPOFOL 10 MG/ML IV BOLUS
INTRAVENOUS | Status: AC
  Filled 2017-02-20: qty 40

## 2017-02-20 SURGICAL SUPPLY — 32 items
APPLICATOR DR MATTHEWS STRL (MISCELLANEOUS) ×2 IMPLANT
BLADE NEEDLE 3 SS STRL (BLADE) ×2 IMPLANT
BLADE SURG 15 STRL LF DISP TIS (BLADE) ×1 IMPLANT
BLADE SURG 15 STRL SS (BLADE) ×1
CLSR STERI-STRIP ANTIMIC 1/2X4 (GAUZE/BANDAGES/DRESSINGS) ×2 IMPLANT
COAGULATOR SUCT 8FR VV (MISCELLANEOUS) ×2 IMPLANT
CORDS BIPOLAR (ELECTRODE) ×2 IMPLANT
COVER SURGICAL LIGHT HANDLE (MISCELLANEOUS) ×2 IMPLANT
DRAPE ORTHO SPLIT 87X125 STRL (DRAPES) ×2 IMPLANT
ELECT NEEDLE BLADE 2-5/6 (NEEDLE) ×2 IMPLANT
FORCEPS BIPOLAR SPETZLER 8 1.0 (NEUROSURGERY SUPPLIES) ×2 IMPLANT
GLOVE BIO SURGEON STRL SZ7.5 (GLOVE) ×2 IMPLANT
GOWN STRL REUS W/ TWL LRG LVL3 (GOWN DISPOSABLE) ×2 IMPLANT
GOWN STRL REUS W/TWL LRG LVL3 (GOWN DISPOSABLE) ×2
NEEDLE HYPO 30X.5 LL (NEEDLE) ×2 IMPLANT
NS IRRIG 1000ML POUR BTL (IV SOLUTION) ×2 IMPLANT
PACK CATARACT CUSTOM (CUSTOM PROCEDURE TRAY) ×2 IMPLANT
PAD ARMBOARD 7.5X6 YLW CONV (MISCELLANEOUS) ×2 IMPLANT
PATTIES SURGICAL 1/4 X 3 (GAUZE/BANDAGES/DRESSINGS) ×2 IMPLANT
PENCIL BUTTON HOLSTER BLD 10FT (ELECTRODE) ×2 IMPLANT
SET INTBT LACRIMAL .016X.025 (MISCELLANEOUS) ×2 IMPLANT
SPOGE SURGIFLO 8M (HEMOSTASIS) ×1
SPONGE SURGIFLO 8M (HEMOSTASIS) ×1 IMPLANT
STRIP CLOSURE SKIN 1/2X4 (GAUZE/BANDAGES/DRESSINGS) ×2 IMPLANT
SURGIFLO W/THROMBIN 8M KIT (HEMOSTASIS) IMPLANT
SUT ETHILON 6 0 P 1 (SUTURE) ×2 IMPLANT
SUT SILK 4 0 P 3 (SUTURE) ×6 IMPLANT
SUT VIC AB 5-0 P-3 18XBRD (SUTURE) ×1 IMPLANT
SUT VIC AB 5-0 P3 18 (SUTURE) ×1
TOWEL OR 17X24 6PK STRL BLUE (TOWEL DISPOSABLE) ×4 IMPLANT
TUBE CONNECTING 12X1/4 (SUCTIONS) ×2 IMPLANT
WATER STERILE IRR 1000ML POUR (IV SOLUTION) ×2 IMPLANT

## 2017-02-20 NOTE — Anesthesia Procedure Notes (Signed)
Procedure Name: Intubation Date/Time: 02/20/2017 7:38 AM Performed by: Lieutenant Diego Pre-anesthesia Checklist: Patient identified, Emergency Drugs available, Suction available and Patient being monitored Patient Re-evaluated:Patient Re-evaluated prior to induction Oxygen Delivery Method: Circle system utilized Preoxygenation: Pre-oxygenation with 100% oxygen Induction Type: IV induction Ventilation: Mask ventilation without difficulty Laryngoscope Size: Miller and 2 Grade View: Grade I Tube type: Oral Tube size: 7.0 mm Number of attempts: 1 Airway Equipment and Method: Stylet and Oral airway Placement Confirmation: ETT inserted through vocal cords under direct vision,  positive ETCO2 and breath sounds checked- equal and bilateral Secured at: 22 cm Tube secured with: Tape Dental Injury: Teeth and Oropharynx as per pre-operative assessment

## 2017-02-20 NOTE — Anesthesia Postprocedure Evaluation (Signed)
Anesthesia Post Note  Patient: Kathleen Mcclain  Procedure(s) Performed: nasolacrimal duct obstruct dacryocystorhinostomy left eye, anterior ephmoidectomy left eye, probe with stent left eye (Left Eye)     Patient location during evaluation: PACU Anesthesia Type: General Level of consciousness: awake and alert Pain management: pain level controlled Vital Signs Assessment: post-procedure vital signs reviewed and stable Respiratory status: spontaneous breathing, nonlabored ventilation, respiratory function stable and patient connected to nasal cannula oxygen Cardiovascular status: blood pressure returned to baseline and stable Postop Assessment: no apparent nausea or vomiting Anesthetic complications: no    Last Vitals:  Vitals:   02/20/17 0955 02/20/17 1000  BP:  (!) 154/76  Pulse:  (!) 105  Resp:  18  Temp: 36.4 C   SpO2:  98%    Last Pain:  Vitals:   02/20/17 0617  TempSrc: Oral                 [name] DAVID

## 2017-02-20 NOTE — Transfer of Care (Signed)
Immediate Anesthesia Transfer of Care Note  Patient: Kathleen Mcclain  Procedure(s) Performed: nasolacrimal duct obstruct dacryocystorhinostomy left eye, anterior ephmoidectomy left eye, probe with stent left eye (Left Eye)  Patient Location: PACU  Anesthesia Type:General  Level of Consciousness: awake and alert   Airway & Oxygen Therapy: Patient Spontanous Breathing and Patient connected to face mask oxygen  Post-op Assessment: Report given to RN and Post -op Vital signs reviewed and stable  Post vital signs: Reviewed and stable  Last Vitals:  Vitals:   02/20/17 0617 02/20/17 0634  BP: (!) 195/87 (!) 178/93  Pulse: 78   Resp: 19   Temp: 36.7 C   SpO2: 100%     Last Pain:  Vitals:   02/20/17 0617  TempSrc: Oral      Patients Stated Pain Goal: 3 (88/82/80 0349)  Complications: No apparent anesthesia complications

## 2017-02-20 NOTE — Interval H&P Note (Signed)
History and Physical Interval Note:  02/20/2017 7:19 AM  Kathleen Mcclain  has presented today for surgery, with the diagnosis of acquired stenosis of unspecified nasolacrimal duct  The various methods of treatment have been discussed with the patient and family. After consideration of risks, benefits and other options for treatment, the patient has consented to  Procedure(s): nasolacrimal duct obstruct dacryocystorhinostomy left eye, anterior ephmoidectomy left eye, probe with stent left eye (Left) as a surgical intervention .  The patient's history has been reviewed, patient examined, no change in status, stable for surgery.  I have reviewed the patient's chart and labs.  Questions were answered to the patient's satisfaction.     Clista Bernhardt

## 2017-02-20 NOTE — Anesthesia Preprocedure Evaluation (Signed)
Anesthesia Evaluation  Patient identified by MRN, date of birth, ID band Patient awake    Reviewed: Allergy & Precautions, NPO status , Patient's Chart, lab work & pertinent test results  Airway Mallampati: I  TM Distance: >3 FB Neck ROM: Full    Dental   Pulmonary    Pulmonary exam normal        Cardiovascular hypertension, Pt. on medications Normal cardiovascular exam     Neuro/Psych    GI/Hepatic   Endo/Other    Renal/GU      Musculoskeletal   Abdominal   Peds  Hematology   Anesthesia Other Findings   Reproductive/Obstetrics                             Anesthesia Physical Anesthesia Plan  ASA: II  Anesthesia Plan: General   Post-op Pain Management:    Induction: Intravenous  PONV Risk Score and Plan: 3 and Ondansetron, Dexamethasone, Midazolam and Treatment may vary due to age or medical condition  Airway Management Planned: Oral ETT  Additional Equipment:   Intra-op Plan:   Post-operative Plan: Extubation in OR  Informed Consent: I have reviewed the patients History and Physical, chart, labs and discussed the procedure including the risks, benefits and alternatives for the proposed anesthesia with the patient or authorized representative who has indicated his/her understanding and acceptance.     Plan Discussed with: CRNA and Surgeon  Anesthesia Plan Comments:         Anesthesia Quick Evaluation

## 2017-02-20 NOTE — Op Note (Addendum)
Procedure(s): nasolacrimal duct obstruct dacryocystorhinostomy left eye, anterior ephmoidectomy left eye, probe with stent left eye Procedure Note  Kathleen Mcclain female 66 y.o. 02/20/2017  Procedure(s) and Anesthesia Type:    * nasolacrimal duct obstruct dacryocystorhinostomy left eye, anterior ephmoidectomy left eye, probe with stent left eye - General  Surgeon(s) and Role:    * Clista Bernhardt, MD - Primary Procedure Detail  nasolacrimal duct obstruct dacryocystorhinostomy left eye, anterior ephmoidectomy left eye, probe with stent left eye  Indications: The patient was admitted to the hospital with a brief history of left-sided nasolacrimal obstruction.  An intitial probing revealed chronic dacryocystitis. The patient now presents for dacryocystorhinostomy after discussing therapeutic alternatives.        Surgeon: Clista Bernhardt   Assistants: none Anesthesia: General endotracheal anesthesia and Local anesthesia 1% buffered lidocaine, with epinephrine, .75 bupivicaine  ASA Class: 3  Procedure Detail Patient was brought to the room in supine position where the appropriate monitoring was put in place. Then the patient was prepped and draped in the usual standard fashion of plastic surgery. The left nasal cavity was packed with cottonoid's soaked in afrin.   Attention was turned to the area between the medial canthus and the nasal bridge which has been previously marked. A 15 blade was used to incise the area. Then blunt dissection was taken down to the level of the anterior lacrimal crest. Then the periosteal elevator was used to bluntly remove the periosteum and the lacrimal sac off of the lacrimal crest. The elevator was used to infracture below the lacrimal crest to create an opening into the nasal cavity. Then a kerrison rongeur was used to remove the bone approximately 41mm to create a window into the nasal cavity. The nasal mucosa was infiltrated with local block.  Then an anterior ethmoidectomy took place.  Antention was turned to the puncta where a 1 bowman probe was used to ensure the appropriate position of the nasolacrimal intubation tubes. A beaver blade was used to create an opening into the nasal cavity through the nasal mucosa. Crawford tubes were then inserted through the upper and lower canalicular system and retrieved through the nasal cavity with a curved forcep. This was sutured to the lateral nasal mucosa with a 4-0 silk suture.  Hemostasis was maintained with monopolar and bipolar cautery.   Surgiflow was placed into the defect and then the incision was closed with 5-0 undyed vicryl and then the skin was closed in an interrupted fashion with 6-0 nylon.  Tobradex ointment was placed over the wound and it was covered with a steri-strip. Then the left nose was covered with an eyepad.  The patient tolerated the procedure well and was transferred to the PACU In stable condition.   Findings: none  Estimated Blood Loss:  less than 50 mL         Drains: none         Total IV Fluids: <2L  Blood Given: none          Specimens: none         Implants: crawford tubes        Complications:  * No complications entered in OR log *         Disposition: PACU - hemodynamically stable.         Condition: stable

## 2017-02-20 NOTE — Brief Op Note (Signed)
02/20/2017  7:20 AM  PATIENT:  Kathleen Mcclain  66 y.o. female  PRE-OPERATIVE DIAGNOSIS:  acquired stenosis of unspecified nasolacrimal duct  POST-OPERATIVE DIAGNOSIS:  * No post-op diagnosis entered *  PROCEDURE:  Procedure(s): nasolacrimal duct obstruct dacryocystorhinostomy left eye, anterior ephmoidectomy left eye, probe with stent left eye (Left)  SURGEON:  Surgeon(s) and Role:    * Abugo, Peyton Najjar, MD - Primary  PHYSICIAN ASSISTANT:   ASSISTANTS: none   ANESTHESIA:   local and general  EBL:  Minimal   BLOOD ADMINISTERED:none  DRAINS: none   LOCAL MEDICATIONS USED:  BUPIVICAINE  and LIDOCAINE   SPECIMEN:  No Specimen  DISPOSITION OF SPECIMEN:  N/A  COUNTS:  YES  TOURNIQUET:  * No tourniquets in log *  DICTATION: .Note written in EPIC  PLAN OF CARE: Discharge to home after PACU  PATIENT DISPOSITION:  PACU - hemodynamically stable.   Delay start of Pharmacological VTE agent (>24hrs) due to surgical blood loss or risk of bleeding: yes

## 2017-02-20 NOTE — Discharge Instructions (Addendum)
Place one drop(Maxitrol Ophthalmic Eyedrops) in left eye two times a day for two week.  Leave Steri-strip in place until post-operative appointment. If you have an eye patch. The Doctor will Remove it in 24hrs.

## 2017-02-21 ENCOUNTER — Encounter (HOSPITAL_COMMUNITY): Payer: Self-pay | Admitting: Oculoplastics Ophthalmology

## 2017-03-18 ENCOUNTER — Other Ambulatory Visit: Payer: Self-pay | Admitting: Physician Assistant

## 2017-03-18 NOTE — Telephone Encounter (Signed)
Refill appropriate 

## 2017-03-19 ENCOUNTER — Other Ambulatory Visit: Payer: Self-pay

## 2017-03-19 MED ORDER — LEVOTHYROXINE SODIUM 50 MCG PO TABS
50.0000 ug | ORAL_TABLET | Freq: Every day | ORAL | 0 refills | Status: DC
Start: 2017-03-19 — End: 2017-06-14

## 2017-03-21 ENCOUNTER — Encounter: Payer: Self-pay | Admitting: Physician Assistant

## 2017-03-21 ENCOUNTER — Other Ambulatory Visit: Payer: Self-pay

## 2017-03-21 ENCOUNTER — Ambulatory Visit (INDEPENDENT_AMBULATORY_CARE_PROVIDER_SITE_OTHER): Payer: BLUE CROSS/BLUE SHIELD | Admitting: Physician Assistant

## 2017-03-21 VITALS — BP 148/86 | HR 85 | Temp 97.9°F | Resp 16 | Ht 64.0 in | Wt 122.0 lb

## 2017-03-21 DIAGNOSIS — Z1159 Encounter for screening for other viral diseases: Secondary | ICD-10-CM

## 2017-03-21 DIAGNOSIS — E039 Hypothyroidism, unspecified: Secondary | ICD-10-CM | POA: Diagnosis not present

## 2017-03-21 DIAGNOSIS — E7800 Pure hypercholesterolemia, unspecified: Secondary | ICD-10-CM | POA: Diagnosis not present

## 2017-03-21 DIAGNOSIS — R739 Hyperglycemia, unspecified: Secondary | ICD-10-CM | POA: Diagnosis not present

## 2017-03-21 DIAGNOSIS — I10 Essential (primary) hypertension: Secondary | ICD-10-CM

## 2017-03-21 DIAGNOSIS — Z114 Encounter for screening for human immunodeficiency virus [HIV]: Secondary | ICD-10-CM | POA: Diagnosis not present

## 2017-03-21 NOTE — Progress Notes (Addendum)
Patient ID: Kathleen Mcclain MRN: [id number], DOB: 1950/10/23, 66 y.o. Date of Encounter: 03/21/2017,   Chief Complaint: Physical (CPE)  HPI: 66 y.o. y/o white female  here for CPE.    03/09/2015: She presents for CPE. She is also being seen as a new patient to establish care. She was going to the [facility] but since it has closed she is establishing care here.  She sees a gynecologist on a routine basis. States that she also is seen at [facility] urology because of history of kidney stones. Says that she now goes there every 2 years. States that she also sees ophthalmologist routinely Dr. Posey Pronto  She states that several years ago she was told that she was prediabetic and was informed regarding diet and exercise changes.  She states that she loved sweets and carbs and made changes and lost 30 pounds. She continues to be very careful with her diet but says that she does not exercise.  She does not work outside of the home. She states that she has 2 sets of grandchildren. One set lives nearby and the other set lives at [place].  She is taking cholesterol medication as directed. No myalgias or other adverse effects. She is taking her thyroid medication daily.   03/22/2016: She reports that she has had a cyst on her back for a long time and it had not been bothering her. However says that it is getting bigger to where it is pressing on her bra area and is getting uncomfortable. He someone he can remove that. She has no other concerns to address today. She is taking cholesterol medication as directed. No myalgias or other adverse effects. She is taking her thyroid medication daily. She continues to be careful with her diet and is eating low-cholesterol and low sugar diet.  09/19/2016: She is taking cholesterol medication as directed. No myalgias or other adverse effects. She is taking her thyroid medication daily. She continues to be careful with her diet and is eating  low-cholesterol and low sugar diet. She continues to check her blood pressure at home and gets 403K systolic. Is not getting any high readings at home. She has no other concerns to address today.  03/21/2017: She recently had surgery to her left eye.  Otherwise states that other than wise she has things have been stable from a medical stand point.  She has no specific concerns to address today. She is taking cholesterol medication as directed. No myalgias or other adverse effects. She is taking her thyroid medication daily. She continues to be careful with her diet and is eating low-cholesterol and low sugar diet. She continues to check her blood pressure at home and gets 742V systolic. Is not getting any high readings at home.   Addendum added 07/17/2017: Received note from Dr. Benson Norway for procedure date 07/16/17 for colonoscopy that date.  Revealed 5 polyps.  Await pathology results.  Repeat colonoscopy in 3-5 years.     Review of Systems: Consitutional: No fever, chills, fatigue, night sweats, lymphadenopathy. No significant/unexplained weight changes. Eyes: ---Recent Eye Surgery-------------------------------------------------------------------------------------------------------------------------- ENT/Mouth: No ear pain, sore throat, nasal drainage, or sinus pain. Cardiovascular: No chest pressure,heaviness, tightness or squeezing, even with exertion. No increased shortness of breath or dyspnea on exertion.No palpitations, edema, orthopnea, PND. Respiratory: No cough, hemoptysis, SOB, or wheezing. Gastrointestinal: No anorexia, dysphagia, reflux, pain, nausea, vomiting, hematemesis, diarrhea, constipation, BRBPR, or melena. Breast: No mass, nodules, bulging, or retraction. No skin changes or inflammation. No nipple discharge.  No lymphadenopathy. Genitourinary: No dysuria, hematuria, incontinence, vaginal discharge, pruritis, burning, abnormal bleeding, or pain. Musculoskeletal: No  decreased ROM, No joint pain or swelling. No significant pain in neck, back, or extremities. Skin: No rash, pruritis. Neurological: No headache, dizziness, syncope, seizures, tremors, memory loss, coordination problems, or paresthesias. Psychological: No anxiety, depression, hallucinations, SI/HI. Endocrine: No polydipsia, polyphagia, polyuria, or known diabetes.No increased fatigue. No palpitations/rapid heart rate. No significant/unexplained weight change. All other systems were reviewed and are otherwise negative.  Past Medical History:  Diagnosis Date  . Allergy   . Arthritis   . Cancer ([place])    pre cancer cells cervical  . Cataract   . Glaucoma   . History of kidney stones   . Hypercholesteremia   . Hypothyroid   . Hypothyroidism   . Personal history of kidney stones   . Pre-diabetes    patient denies     Past Surgical History:  Procedure Laterality Date  . CERVICAL BIOPSY  12/07   pre cancerous cells  . COLONOSCOPY    . EYE SURGERY Left 05/2016   tear duct surgery  . TEAR DUCT PROBING Left 02/20/2017   Procedure: nasolacrimal duct obstruct dacryocystorhinostomy left eye, anterior ephmoidectomy left eye, probe with stent left eye;  Surgeon: Clista Bernhardt, MD;  Location: [facility];  Service: Ophthalmology;  Laterality: Left;  . TONSILLECTOMY    . TUBAL LIGATION  5/86    Home Meds:  Outpatient Medications Prior to Visit  Medication Sig Dispense Refill  . atorvastatin (LIPITOR) 10 MG tablet TAKE 1 TABLET (10 MG TOTAL) BY MOUTH DAILY. (Patient taking differently: Take 10 mg by mouth at bedtime. ) 30 tablet 0  . Carboxymeth-Glycerin-Polysorb (REFRESH OPTIVE ADVANCED) 0.5-1-0.5 % SOLN Place 1 drop into both eyes as needed (dry eyes).     . cholecalciferol (VITAMIN D) 1000 units tablet Take 1,000 Units by mouth daily.    Marland Kitchen ketotifen (ZADITOR) 0.025 % ophthalmic solution Place 1 drop into both eyes as needed (allergies).     Marland Kitchen levothyroxine (SYNTHROID, LEVOTHROID) 50 MCG  tablet Take 1 tablet (50 mcg total) by mouth daily. 90 tablet 0  . Multiple Vitamin (MULTIVITAMIN) tablet Take 1 tablet by mouth daily.    . timolol (BETIMOL) 0.5 % ophthalmic solution Place 1 drop into both eyes daily.    . Travoprost, BAK Free, (TRAVATAN) 0.004 % SOLN ophthalmic solution Place 1 drop into both eyes at bedtime.    Marland Kitchen levothyroxine (SYNTHROID, LEVOTHROID) 75 MCG tablet TAKE 1 TABLET (75 MCG TOTAL) BY MOUTH DAILY. (Patient not taking: Reported on 12/31/2016) 90 tablet 0   No facility-administered medications prior to visit.     Allergies:  Allergies  Allergen Reactions  . Penicillins Swelling    SWELLING REACTION UNSPECIFIED   PATIENT HAS HAD A PCN REACTION WITH IMMEDIATE RASH, FACIAL/TONGUE/THROAT SWELLING, SOB, OR LIGHTHEADEDNESS WITH HYPOTENSION:  #  #  #  YES  #  #  #   Has patient had a PCN reaction causing severe rash involving mucus membranes or skin necrosis: No Has patient had a PCN reaction that required hospitalization: No Has patient had a PCN reaction occurring within the last 10 years: No   . Alphagan [Brimonidine] Other (See Comments)    Opthalmic solution    Red itchy eyes    Social History   Socioeconomic History  . Marital status: Married    Spouse name: Not on file  . Number of children: Not on file  . Years of education:  Not on file  . Highest education level: Not on file  Social Needs  . Financial resource strain: Not on file  . Food insecurity - worry: Not on file  . Food insecurity - inability: Not on file  . Transportation needs - medical: Not on file  . Transportation needs - non-medical: Not on file  Occupational History  . Not on file  Tobacco Use  . Smoking status: Never Smoker  . Smokeless tobacco: Never Used  Substance and Sexual Activity  . Alcohol use: No  . Drug use: No  . Sexual activity: Not on file  Other Topics Concern  . Not on file  Social History Narrative   Does not work outside of home.    Has 2 sets of  grandchildren--1 set lives in this area. 1 set lives at [place].   Approximately 2013-- Was told "PreDiabetic"---She lost 30 pounds. Very careful with diet now. Does not exercise.    Family History  Problem Relation Age of Onset  . Alzheimer's disease Mother   . Cancer Father        Pancreatic Cancer  . Hyperlipidemia Father   . Arthritis Maternal Grandmother   . Diabetes Maternal Grandfather   . Stroke Maternal Grandfather   . Cancer Paternal Grandmother   . Cancer Paternal Grandfather     Physical Exam: Blood pressure (!) 152/86, 92- pulse , temperature 98.3 F (36.8 C), temperature source Oral, resp. rate 18, weight 113 lb (51.3 kg), SpO2 98 %., Body mass index is 20.94 kg/m. General: Well developed, well nourished, WF. Appears in no acute distress. Neck: Supple. Trachea midline. No thyromegaly. Full ROM. No lymphadenopathy.No Carotid Bruits. Lungs: Clear to auscultation bilaterally without wheezes, rales, or rhonchi. Breathing is of normal effort and unlabored. Cardiovascular: RRR with S1 S2. No murmurs, rubs, or gallops.  Abdomen: Soft, non-tender, non-distended with normoactive bowel sounds. No hepatosplenomegaly or masses. No rebound/guarding.  Musculoskeletal: Full range of motion and 5/5 strength throughout.  Skin: Warm and moist without erythema, ecchymosis, wounds, or rash. Neuro: A+Ox3. CN II-XII grossly intact. Moves all extremities spontaneously. Full sensation throughout. Normal gait.  Psych:  Responds to questions appropriately with a normal affect.   Assessment/Plan:  66 y.o. y/o female here for    Hyperglycemia 02/2015: When she was diagnosed with this 2 years ago she lost 30 pounds. She is very compliant with diet. Says she is aware she is supposed to be exercising but says not do routine exercise.  Will check lab to monitor.  - Hemoglobin A1c 02/2016: Reassured patient that glucose and A1c recently performed  now normal. Continue diet  changes. 09/19/2016----A1Cs checked since transferring her care to me have been normal. She has maintained weight loss. Will not recheck A1C now unless Glucose comes back high 03/21/2017: Last glucose -- 08/2016---was normal.A1Cs checked since transferring her care to me have been normal. She has maintained weight loss. Will not recheck A1C now unless Glucose comes back high   Hypercholesteremia She states that her prior PCP was doing office visits and labs only once per year. She is on Lipitor 10 mg.  03/21/2017: She is fasting. Recheck FLP and LFT to monitor.   Hypothyroidism, unspecified hypothyroidism type 02/2015: She states that her prior PCP was doing office visits and labs only once per year.  03/22/2016: She is on levothyroxine 75 g daily.   Recent TSH slightly out of normal range, but stable and barely out of normal range---cont current dose of thyroid medication.  -  TSH 03/21/2017----recheck TSH to monitor.   White coat hypertension 02/2015: She states that she just bought a new blood pressure cuff for one year ago and knows that it reads accurately. States that at home she gets systolics 270W to 237S.  She will continue to monitor this at home. If readings do increase >140/90, she will follow-up with [place]. 02/2016: Discussed that her blood pressure and heart rate are running on the high side again today. She states that she has been checking this with her cuff at home and getting good readings. She states that her son's girlfriend is in nursing school and she will have her check heart rate and blood pressure several times and will follow up with [place] if they are running high. 03/21/2017---Discussed that again blood pressure is reading high here but she states that she continues to check blood pressure routinely and is getting good readings.   03/21/2017: Reviewed preventive care and reviewed information below.   Updated information: She reports that her gynecologist is at [place].  Today I will have her sign a release form for them to send [place] copies of mammograms. Discussed colonoscopy today.  She states that this is due and she will call and schedule follow-up.  Prior was done with Dr. Benson Norway. Asked her about a estimate of when her Tdap would have been done.  She thinks probably around 2013 but really is not certain. Addendum added 07/17/2017: Received note from Dr. Benson Norway for procedure date 07/16/17 for colonoscopy that date.  Revealed 5 polyps.  Await pathology results.  Repeat colonoscopy in 3-5 years.    -----------------THE FOLLOWING IS COPIED FROM HER CPE 03/22/2016------IS NOT ADDRESSED AT OV 09/19/2016-----------------------------  Visit for preventive health examination  A. Screening Labs: She recently came and had fasting labs. Reviewed those results today.  She was concerned about her sugar but I reassured her that her glucose and A1c are now reading normal.  B. Pap: This is managed by GYN. She states her next appointment there is in December.  C. Screening Mammogram: This is managed by GYN. She states her next appointment there is in December.   D. DEXA/BMD:  At her CPE 44--we discussed Fosamax-- Discussed that she had been on Fosamax in the past but it had been discontinued. She said that this was stopped around the same time that she was having kidney stones and thought that it was stopped because it was affecting her calcium and kidney stones 02/2016--- her prior DEXA scan from[facility] is now showing up in epic this was performed 12/16/2009 and showed T-scores of - 2.4 and -2.0.  E. Colorectal Cancer Screening: She states that she has had one colonoscopy performed in the past and her next one is due in about 1 or 2 more years. She states that her husband had his colonoscopy by the same GI doctor and that the GI doctor does send a reminder because her husband just recently got his reminder and she knows that her colonoscopy is due 1 or 2  years after his. Addendum added 07/17/2017: Received note from Dr. Benson Norway for procedure date 07/16/17 for colonoscopy that date.  Revealed 5 polyps.  Await pathology results.  Repeat colonoscopy in 3-5 years.   F. Immunizations:  Influenza: ---Given here 03/09/15. She has already received her flu vaccine here for the 2017 season. Tetanus:  02/2015--she reported: She states that she remembers when her grandchildren were babies she got the pertussis vaccine so it sounds like she would've  gotten the T dap and this will be up to date. Pneumococcal:  She has no indication to require the pneumonia vaccines until age 44 Zostavax:  Given here 03/09/15    Her prior PCP was doing office visits and labs only once per year. Will plan follow-up in one year or sooner if needed.   Signed, [address] [place], [place], [facility] 03/21/2017 8:03 AM

## 2017-03-22 LAB — HEPATITIS C ANTIBODY
Hepatitis C Ab: NONREACTIVE
SIGNAL TO CUT-OFF: 0.16 (ref <1.00)

## 2017-03-26 LAB — COMPLETE METABOLIC PANEL WITH GFR
AG RATIO: 1.4 (calc) (ref 1.0–2.5)
ALBUMIN MSPROF: 4 g/dL (ref 3.6–5.1)
ALT: 54 U/L — ABNORMAL HIGH (ref 6–29)
AST: 44 U/L — ABNORMAL HIGH (ref 10–35)
Alkaline phosphatase (APISO): 97 U/L (ref 33–130)
BUN: 10 mg/dL (ref 7–25)
CO2: 26 mmol/L (ref 20–32)
CREATININE: 0.82 mg/dL (ref 0.50–0.99)
Calcium: 9.6 mg/dL (ref 8.6–10.4)
Chloride: 106 mmol/L (ref 98–110)
GFR, EST AFRICAN AMERICAN: 88 mL/min/{1.73_m2} (ref >=60)
GFR, EST NON AFRICAN AMERICAN: 76 mL/min/{1.73_m2} (ref >=60)
GLOBULIN: 2.8 g/dL (ref 1.9–3.7)
Glucose, Bld: 106 mg/dL — ABNORMAL HIGH (ref 65–99)
POTASSIUM: 4.8 mmol/L (ref 3.5–5.3)
SODIUM: 142 mmol/L (ref 135–146)
Total Bilirubin: 0.9 mg/dL (ref 0.2–1.2)
Total Protein: 6.8 g/dL (ref 6.1–8.1)

## 2017-03-26 LAB — TEST AUTHORIZATION

## 2017-03-26 LAB — LIPID PANEL
CHOL/HDL RATIO: 2.4 (calc) (ref <5.0)
CHOLESTEROL: 175 mg/dL (ref <200)
HDL: 73 mg/dL (ref >50)
LDL Cholesterol (Calc): 84 mg/dL (calc)
Non-HDL Cholesterol (Calc): 102 mg/dL (calc) (ref <130)
Triglycerides: 86 mg/dL (ref <150)

## 2017-03-26 LAB — HEPATITIS PANEL, ACUTE
Hepatitis C Ab: NONREACTIVE
SIGNAL TO CUT-OFF: 0.16 (ref <1.00)

## 2017-03-26 LAB — TSH: TSH: 5.75 mIU/L — ABNORMAL HIGH (ref 0.40–4.50)

## 2017-03-26 LAB — HIV ANTIBODY (ROUTINE TESTING W REFLEX): HIV: NONREACTIVE

## 2017-04-03 ENCOUNTER — Other Ambulatory Visit: Payer: Self-pay

## 2017-04-03 DIAGNOSIS — E039 Hypothyroidism, unspecified: Secondary | ICD-10-CM

## 2017-04-03 DIAGNOSIS — R7989 Other specified abnormal findings of blood chemistry: Secondary | ICD-10-CM

## 2017-04-03 DIAGNOSIS — R945 Abnormal results of liver function studies: Principal | ICD-10-CM

## 2017-04-03 MED ORDER — ATORVASTATIN CALCIUM 10 MG PO TABS: 10.0000 mg | ORAL_TABLET | Freq: Every day | ORAL | 2 refills | Status: DC

## 2017-04-03 MED ORDER — LEVOTHYROXINE SODIUM 75 MCG PO TABS: ORAL_TABLET | ORAL | 0 refills | Status: DC

## 2017-04-04 ENCOUNTER — Other Ambulatory Visit: Payer: BLUE CROSS/BLUE SHIELD

## 2017-04-04 DIAGNOSIS — R945 Abnormal results of liver function studies: Secondary | ICD-10-CM

## 2017-04-04 DIAGNOSIS — R7989 Other specified abnormal findings of blood chemistry: Secondary | ICD-10-CM

## 2017-04-04 DIAGNOSIS — E039 Hypothyroidism, unspecified: Secondary | ICD-10-CM

## 2017-04-05 LAB — HEPATITIS PANEL, ACUTE
HEP A IGM: NONREACTIVE
HEP B C IGM: NONREACTIVE
HEP B S AG: NONREACTIVE
Hepatitis C Ab: NONREACTIVE
SIGNAL TO CUT-OFF: 0.24 (ref <1.00)

## 2017-04-05 LAB — TSH: TSH: 8.57 m[IU]/L — AB (ref 0.40–4.50)

## 2017-05-09 LAB — HM MAMMOGRAPHY

## 2017-05-10 ENCOUNTER — Other Ambulatory Visit: Payer: Self-pay

## 2017-05-15 ENCOUNTER — Other Ambulatory Visit: Payer: BLUE CROSS/BLUE SHIELD

## 2017-05-15 ENCOUNTER — Other Ambulatory Visit: Payer: Self-pay

## 2017-05-15 DIAGNOSIS — R7989 Other specified abnormal findings of blood chemistry: Secondary | ICD-10-CM

## 2017-05-15 LAB — TSH: TSH: 3.58 mIU/L (ref 0.40–4.50)

## 2017-05-16 ENCOUNTER — Telehealth: Payer: Self-pay | Admitting: Physician Assistant

## 2017-05-16 NOTE — Telephone Encounter (Signed)
Patient calling to get refill on her atorvastatin  [facility] [address]

## 2017-05-20 NOTE — Telephone Encounter (Signed)
Patient calling to get refill on her atorvastatin  [facility] [address].  Patient is completed out  CB# [phone]

## 2017-05-21 ENCOUNTER — Telehealth: Payer: Self-pay

## 2017-05-21 MED ORDER — ATORVASTATIN CALCIUM 10 MG PO TABS: 10.0000 mg | ORAL_TABLET | Freq: Every day | ORAL | 2 refills | Status: DC

## 2017-05-21 NOTE — Telephone Encounter (Signed)
Rx sent to pharamacy.  

## 2017-05-29 NOTE — Telephone Encounter (Signed)
I have faxed a letter to [facility] requesting records for mammogram results.

## 2017-06-14 ENCOUNTER — Other Ambulatory Visit: Payer: Self-pay

## 2017-06-14 MED ORDER — LEVOTHYROXINE SODIUM 50 MCG PO TABS: 50.0000 ug | ORAL_TABLET | Freq: Every day | ORAL | 0 refills | Status: DC

## 2017-06-29 ENCOUNTER — Other Ambulatory Visit: Payer: Self-pay | Admitting: Physician Assistant

## 2017-07-16 LAB — HM COLONOSCOPY

## 2017-08-30 ENCOUNTER — Other Ambulatory Visit: Payer: Self-pay

## 2017-08-30 ENCOUNTER — Encounter (HOSPITAL_COMMUNITY): Payer: Self-pay | Admitting: *Deleted

## 2017-08-30 SURGERY — Surgical Case
Anesthesia: *Unknown

## 2017-08-30 NOTE — Progress Notes (Signed)
Spoke with pt for pre-op call. Pt denies cardiac history. Pt states she has never been diagnosed with HTN, but her blood pressure is always elevated at doctor's offices and hospitals. Pt states she is borderline diabetic. She is not sure what her last A1C was. I have called [facility] requesting her most recent A1C. Pt does not check her blood sugar at home.

## 2017-08-30 NOTE — Anesthesia Preprocedure Evaluation (Addendum)
Anesthesia Evaluation  Patient identified by MRN, date of birth, ID band Patient awake    Reviewed: Allergy & Precautions, NPO status , Patient's Chart, lab work & pertinent test results  Airway Mallampati: II  TM Distance: >3 FB     Dental   Pulmonary neg pulmonary ROS,    breath sounds clear to auscultation       Cardiovascular hypertension,  Rhythm:Regular Rate:Normal     Neuro/Psych    GI/Hepatic negative GI ROS, Neg liver ROS,   Endo/Other  Hypothyroidism   Renal/GU negative Renal ROS     Musculoskeletal  (+) Arthritis ,   Abdominal   Peds  Hematology   Anesthesia Other Findings   Reproductive/Obstetrics                            Anesthesia Physical Anesthesia Plan  ASA: III  Anesthesia Plan: General   Post-op Pain Management:    Induction: Intravenous  PONV Risk Score and Plan: 3 and Treatment may vary due to age or medical condition, Ondansetron, Dexamethasone and Midazolam  Airway Management Planned: Oral ETT  Additional Equipment:   Intra-op Plan:   Post-operative Plan: Extubation in OR  Informed Consent: I have reviewed the patients History and Physical, chart, labs and discussed the procedure including the risks, benefits and alternatives for the proposed anesthesia with the patient or authorized representative who has indicated his/her understanding and acceptance.   Dental advisory given  Plan Discussed with: Anesthesiologist and CRNA  Anesthesia Plan Comments:        Anesthesia Quick Evaluation

## 2017-08-30 NOTE — H&P (Signed)
Subjective:    Kathleen Mcclain is a 67 y.o. female who presents for evaluation of left sided nasolacrimal duct obstruction. The pain is described as intermittent. Onset was several months ago. Symptoms have been unchanged since.   Review of Systems Pertinent items are noted in HPI.    Objective:   There were no vitals taken for this visit.  General:  alert, cooperative and appears stated age Skin:  normal Eyes: conjunctivae/corneas clear. PERRL, EOM's intact. Fundi benign. No stent in place, s/p excision of medial conjunctival adhesion OS Mouth: MMM no lesions Lymph Nodes:  Cervical, supraclavicular, and axillary nodes normal. Lungs:  clear to auscultation bilaterally Heart:  regular rate and rhythm, S1, S2 normal, no murmur, click, rub or gallop Abdomen: soft, non-tender; bowel sounds normal; no masses,  no organomegaly CVA:  absent Genitourinary: defer exam Extremities:  extremities normal, atraumatic, no cyanosis or edema Neurologic:  negative Psychiatric:  normal mood, behavior, speech, dress, and thought processes    Assessment: Nasolacrimal Duct Obstruction Left Side   Plan: Dacryocystorhinostomy Possible Anterior Ethmoidectomy, Dilation, Probing, Stent Placement Left Side.  1. Discussed the risk of surgery,  and the risks of general anesthetic including MI, CVA, sudden death or even reaction to anesthetic medications. The patient understands the risks, any and all questions were answered to the patient's satisfaction. 2. Follow up: 1 week.  Date of Surgery Update (To be completed by Attending Surgeon day of surgery.)

## 2017-08-31 ENCOUNTER — Ambulatory Visit (HOSPITAL_COMMUNITY): Payer: BLUE CROSS/BLUE SHIELD | Admitting: Anesthesiology

## 2017-08-31 ENCOUNTER — Ambulatory Visit (HOSPITAL_COMMUNITY)
Admission: RE | Admit: 2017-08-31 | Discharge: 2017-08-31 | Disposition: A | Discharge status: Home / Self Care | Payer: BLUE CROSS/BLUE SHIELD | Source: Ambulatory Visit | Attending: Oculoplastics Ophthalmology | Admitting: Oculoplastics Ophthalmology

## 2017-08-31 ENCOUNTER — Encounter (HOSPITAL_COMMUNITY): Payer: Self-pay | Admitting: *Deleted

## 2017-08-31 ENCOUNTER — Encounter (HOSPITAL_COMMUNITY)
Admission: RE | Disposition: A | Discharge status: Home / Self Care | Payer: Self-pay | Source: Ambulatory Visit | Attending: Oculoplastics Ophthalmology

## 2017-08-31 DIAGNOSIS — H04552 Acquired stenosis of left nasolacrimal duct: Secondary | ICD-10-CM | POA: Insufficient documentation

## 2017-08-31 DIAGNOSIS — Z888 Allergy status to other drugs, medicaments and biological substances status: Secondary | ICD-10-CM | POA: Insufficient documentation

## 2017-08-31 DIAGNOSIS — M1990 Unspecified osteoarthritis, unspecified site: Secondary | ICD-10-CM | POA: Insufficient documentation

## 2017-08-31 DIAGNOSIS — Z880 Allergy status to penicillin: Secondary | ICD-10-CM | POA: Diagnosis not present

## 2017-08-31 DIAGNOSIS — Z79899 Other long term (current) drug therapy: Secondary | ICD-10-CM | POA: Diagnosis not present

## 2017-08-31 DIAGNOSIS — I10 Essential (primary) hypertension: Secondary | ICD-10-CM | POA: Insufficient documentation

## 2017-08-31 DIAGNOSIS — H0489 Other disorders of lacrimal system: Secondary | ICD-10-CM | POA: Insufficient documentation

## 2017-08-31 DIAGNOSIS — E039 Hypothyroidism, unspecified: Secondary | ICD-10-CM | POA: Diagnosis not present

## 2017-08-31 HISTORY — PX: ETHMOIDECTOMY: SHX5197

## 2017-08-31 HISTORY — PX: DACRORHINOCYSTOTOMY: SHX5559

## 2017-08-31 LAB — CBC
HEMATOCRIT: 43.2 % (ref 36.0–46.0)
Hemoglobin: 14 g/dL (ref 12.0–15.0)
MCH: 29.7 pg (ref 26.0–34.0)
MCHC: 32.4 g/dL (ref 30.0–36.0)
MCV: 91.5 fL (ref 78.0–100.0)
Platelets: 176 10*3/uL (ref 150–400)
RBC: 4.72 MIL/uL (ref 3.87–5.11)
RDW: 13.8 % (ref 11.5–15.5)
WBC: 8.5 10*3/uL (ref 4.0–10.5)

## 2017-08-31 LAB — BASIC METABOLIC PANEL
Anion gap: 8 (ref 5–15)
BUN: 12 mg/dL (ref 6–20)
CALCIUM: 9.5 mg/dL (ref 8.9–10.3)
CHLORIDE: 110 mmol/L (ref 101–111)
CO2: 27 mmol/L (ref 22–32)
CREATININE: 0.89 mg/dL (ref 0.44–1.00)
GFR calc Af Amer: >60 mL/min (ref >60)
GFR calc non Af Amer: >60 mL/min (ref >60)
GLUCOSE: 107 mg/dL — AB (ref 65–99)
Potassium: 4.3 mmol/L (ref 3.5–5.1)
Sodium: 145 mmol/L (ref 135–145)

## 2017-08-31 LAB — PROTIME-INR
INR: 0.97
Prothrombin Time: 12.8 seconds (ref 11.4–15.2)

## 2017-08-31 SURGERY — DACRYOCYSTORHINOSTOMY
Anesthesia: General | Site: Eye | Laterality: Left

## 2017-08-31 MED ORDER — FENTANYL CITRATE (PF) 250 MCG/5ML IJ SOLN
INTRAMUSCULAR | Status: AC
  Filled 2017-08-31: qty 5

## 2017-08-31 MED ORDER — PROPOFOL 10 MG/ML IV BOLUS
INTRAVENOUS | Status: AC
  Filled 2017-08-31: qty 20

## 2017-08-31 MED ORDER — LIDOCAINE HCL (CARDIAC) PF 100 MG/5ML IV SOSY
PREFILLED_SYRINGE | INTRAVENOUS | Status: DC | PRN
  Administered 2017-08-31: 50 mg via INTRAVENOUS

## 2017-08-31 MED ORDER — TOBRAMYCIN 0.3 % OP OINT
TOPICAL_OINTMENT | OPHTHALMIC | Status: DC | PRN
  Administered 2017-08-31: 1 via OPHTHALMIC

## 2017-08-31 MED ORDER — GENTAMICIN SULFATE 40 MG/ML IJ SOLN
270.0000 mg | Freq: Once | INTRAVENOUS | Status: AC
  Administered 2017-08-31: 270 mg via INTRAVENOUS
  Filled 2017-08-31: qty 6.75

## 2017-08-31 MED ORDER — BUPIVACAINE HCL (PF) 0.75 % IJ SOLN
INTRAMUSCULAR | Status: AC
  Filled 2017-08-31: qty 10

## 2017-08-31 MED ORDER — MIDAZOLAM HCL 5 MG/5ML IJ SOLN
INTRAMUSCULAR | Status: DC | PRN
  Administered 2017-08-31: 2 mg via INTRAVENOUS

## 2017-08-31 MED ORDER — PHENYLEPHRINE HCL 10 MG/ML IJ SOLN
INTRAMUSCULAR | Status: DC | PRN
  Administered 2017-08-31: 80 ug via INTRAVENOUS

## 2017-08-31 MED ORDER — TOBRAMYCIN-DEXAMETHASONE 0.3-0.1 % OP OINT
TOPICAL_OINTMENT | OPHTHALMIC | Status: AC
  Filled 2017-08-31: qty 3.5

## 2017-08-31 MED ORDER — PROPOFOL 10 MG/ML IV BOLUS
INTRAVENOUS | Status: DC | PRN
  Administered 2017-08-31: 150 mg via INTRAVENOUS

## 2017-08-31 MED ORDER — CEFAZOLIN SODIUM-DEXTROSE 2-4 GM/100ML-% IV SOLN
INTRAVENOUS | Status: AC
  Filled 2017-08-31: qty 100

## 2017-08-31 MED ORDER — ONDANSETRON HCL 4 MG/2ML IJ SOLN
INTRAMUSCULAR | Status: DC | PRN
  Administered 2017-08-31: 4 mg via INTRAVENOUS

## 2017-08-31 MED ORDER — GLYCOPYRROLATE 0.2 MG/ML IJ SOLN
INTRAMUSCULAR | Status: DC | PRN
  Administered 2017-08-31: 0.2 mg via INTRAVENOUS

## 2017-08-31 MED ORDER — MIDAZOLAM HCL 2 MG/2ML IJ SOLN
INTRAMUSCULAR | Status: AC
  Filled 2017-08-31: qty 2

## 2017-08-31 MED ORDER — LIDOCAINE-EPINEPHRINE 1 %-1:100000 IJ SOLN
INTRAMUSCULAR | Status: AC
  Filled 2017-08-31: qty 1

## 2017-08-31 MED ORDER — HEMOSTATIC AGENTS (NO CHARGE) OPTIME
TOPICAL | Status: DC | PRN
  Administered 2017-08-31: 1 via TOPICAL

## 2017-08-31 MED ORDER — ROCURONIUM BROMIDE 50 MG/5ML IV SOLN
INTRAVENOUS | Status: AC
  Filled 2017-08-31: qty 1

## 2017-08-31 MED ORDER — DEXAMETHASONE SODIUM PHOSPHATE 10 MG/ML IJ SOLN
INTRAMUSCULAR | Status: AC
  Filled 2017-08-31: qty 1

## 2017-08-31 MED ORDER — EPHEDRINE SULFATE 50 MG/ML IJ SOLN
INTRAMUSCULAR | Status: DC | PRN
  Administered 2017-08-31: 2.5 mg via INTRAVENOUS
  Administered 2017-08-31: 5 mg via INTRAVENOUS

## 2017-08-31 MED ORDER — BSS IO SOLN
INTRAOCULAR | Status: DC | PRN
  Administered 2017-08-31: 30 mL via INTRAOCULAR

## 2017-08-31 MED ORDER — BUPIVACAINE HCL (PF) 0.75 % IJ SOLN
INTRAMUSCULAR | Status: DC | PRN
  Administered 2017-08-31: 10 mL

## 2017-08-31 MED ORDER — SUGAMMADEX SODIUM 200 MG/2ML IV SOLN
INTRAVENOUS | Status: DC | PRN
  Administered 2017-08-31: 150 mg via INTRAVENOUS

## 2017-08-31 MED ORDER — OXYMETAZOLINE HCL 0.05 % NA SOLN
NASAL | Status: DC | PRN
  Administered 2017-08-31: 1 via TOPICAL

## 2017-08-31 MED ORDER — DEXAMETHASONE SODIUM PHOSPHATE 10 MG/ML IJ SOLN
INTRAMUSCULAR | Status: DC | PRN
  Administered 2017-08-31: 8 mg via INTRAVENOUS

## 2017-08-31 MED ORDER — SUCCINYLCHOLINE CHLORIDE 200 MG/10ML IV SOSY
PREFILLED_SYRINGE | INTRAVENOUS | Status: AC
  Filled 2017-08-31: qty 10

## 2017-08-31 MED ORDER — PHENYLEPHRINE HCL 10 MG/ML IJ SOLN
INTRAVENOUS | Status: DC | PRN
  Administered 2017-08-31: 40 ug/min via INTRAVENOUS

## 2017-08-31 MED ORDER — OXYMETAZOLINE HCL 0.05 % NA SOLN
NASAL | Status: AC
  Filled 2017-08-31: qty 15

## 2017-08-31 MED ORDER — TOBRAMYCIN-DEXAMETHASONE 0.3-0.1 % OP SUSP
2.0000 [drp] | Freq: Two times a day (BID) | OPHTHALMIC | Status: DC
  Filled 2017-08-31: qty 2.5

## 2017-08-31 MED ORDER — BUPIVACAINE HCL (PF) 0.5 % IJ SOLN
INTRAMUSCULAR | Status: AC
  Filled 2017-08-31: qty 10

## 2017-08-31 MED ORDER — SUGAMMADEX SODIUM 500 MG/5ML IV SOLN
INTRAVENOUS | Status: AC
  Filled 2017-08-31: qty 5

## 2017-08-31 MED ORDER — LIDOCAINE 2% (20 MG/ML) 5 ML SYRINGE
INTRAMUSCULAR | Status: AC
  Filled 2017-08-31: qty 10

## 2017-08-31 MED ORDER — ROCURONIUM BROMIDE 100 MG/10ML IV SOLN
INTRAVENOUS | Status: DC | PRN
  Administered 2017-08-31: 40 mg via INTRAVENOUS

## 2017-08-31 MED ORDER — FENTANYL CITRATE (PF) 100 MCG/2ML IJ SOLN: 25.0000 ug | INTRAMUSCULAR | Status: DC | PRN

## 2017-08-31 MED ORDER — 0.9 % SODIUM CHLORIDE (POUR BTL) OPTIME
TOPICAL | Status: DC | PRN
  Administered 2017-08-31: 1000 mL

## 2017-08-31 MED ORDER — LIDOCAINE-EPINEPHRINE 1 %-1:100000 IJ SOLN
INTRAMUSCULAR | Status: DC | PRN
  Administered 2017-08-31: .3 mL

## 2017-08-31 MED ORDER — HYDROCODONE-ACETAMINOPHEN 5-325 MG PO TABS: 1.0000 | ORAL_TABLET | Freq: Four times a day (QID) | ORAL | 0 refills | Status: AC | PRN

## 2017-08-31 MED ORDER — TOBRAMYCIN-DEXAMETHASONE 0.3-0.1 % OP SUSP
OPHTHALMIC | Status: AC
  Filled 2017-08-31: qty 2.5

## 2017-08-31 MED ORDER — SODIUM CHLORIDE 0.9 % IV SOLN
INTRAVENOUS | Status: DC
  Administered 2017-08-31 (×2): via INTRAVENOUS

## 2017-08-31 MED ORDER — BSS IO SOLN
INTRAOCULAR | Status: AC
  Filled 2017-08-31: qty 30

## 2017-08-31 MED ORDER — ONDANSETRON HCL 4 MG/2ML IJ SOLN
INTRAMUSCULAR | Status: AC
  Filled 2017-08-31: qty 2

## 2017-08-31 MED ORDER — FENTANYL CITRATE (PF) 100 MCG/2ML IJ SOLN
INTRAMUSCULAR | Status: DC | PRN
  Administered 2017-08-31: 50 ug via INTRAVENOUS
  Administered 2017-08-31: 100 ug via INTRAVENOUS

## 2017-08-31 SURGICAL SUPPLY — 42 items
APPLICATOR COTTON TIP 6IN STRL (MISCELLANEOUS) IMPLANT
APPLICATOR DR MATTHEWS STRL (MISCELLANEOUS) ×2 IMPLANT
BLADE SURG 15 STRL LF DISP TIS (BLADE) ×1 IMPLANT
BLADE SURG 15 STRL SS (BLADE) ×1
CLSR STERI-STRIP ANTIMIC 1/2X4 (GAUZE/BANDAGES/DRESSINGS) ×2 IMPLANT
COAGULATOR SUCT 8FR VV (MISCELLANEOUS) ×2 IMPLANT
COVER MAYO STAND STRL (DRAPES) ×4 IMPLANT
COVER SURGICAL LIGHT HANDLE (MISCELLANEOUS) ×2 IMPLANT
DRAPE ORTHO SPLIT 77X108 STRL (DRAPES) ×1
DRAPE ORTHO SPLIT 87X125 STRL (DRAPES) ×2 IMPLANT
DRAPE SURG 17X23 STRL (DRAPES) ×4 IMPLANT
DRAPE SURG ORHT 6 SPLT 77X108 (DRAPES) ×1 IMPLANT
DRAPE UTILITY XL STRL (DRAPES) ×2 IMPLANT
ELECT NEEDLE BLADE 2-5/6 (NEEDLE) IMPLANT
ELECT REM PT RETURN 9FT ADLT (ELECTROSURGICAL) ×2
ELECTRODE REM PT RTRN 9FT ADLT (ELECTROSURGICAL) ×1 IMPLANT
FLOSEAL 5ML (HEMOSTASIS) ×2 IMPLANT
FORCEPS BIPOLAR SPETZLER 8 1.0 (NEUROSURGERY SUPPLIES) ×2 IMPLANT
GLOVE BIO SURGEON STRL SZ7.5 (GLOVE) IMPLANT
GOWN STRL REUS W/ TWL LRG LVL3 (GOWN DISPOSABLE) ×2 IMPLANT
GOWN STRL REUS W/TWL LRG LVL3 (GOWN DISPOSABLE) ×2
NEEDLE PRECISIONGLIDE 27X1.5 (NEEDLE) IMPLANT
NS IRRIG 1000ML POUR BTL (IV SOLUTION) ×2 IMPLANT
PACK CATARACT CUSTOM (CUSTOM PROCEDURE TRAY) ×2 IMPLANT
PAD ARMBOARD 7.5X6 YLW CONV (MISCELLANEOUS) ×4 IMPLANT
PATTIES SURGICAL .5 X3 (DISPOSABLE) ×2 IMPLANT
PENCIL BUTTON HOLSTER BLD 10FT (ELECTRODE) ×2 IMPLANT
SET INTBT LACRIMAL .016X.025 (MISCELLANEOUS) ×2 IMPLANT
STAPLER VISISTAT 35W (STAPLE) IMPLANT
STRIP CLOSURE SKIN 1/2X4 (GAUZE/BANDAGES/DRESSINGS) IMPLANT
SUT CHROMIC 4 0 P 3 18 (SUTURE) IMPLANT
SUT CHROMIC 5 0 P 3 (SUTURE) IMPLANT
SUT ETHILON 6 0 P 1 (SUTURE) ×2 IMPLANT
SUT ETHILON 7 0 P 6 18 (SUTURE) IMPLANT
SUT MERSILENE 5 0 RD 1 DA (SUTURE) ×4 IMPLANT
SUT PLAIN 5 0 P 3 18 (SUTURE) ×2 IMPLANT
SUT SILK 4 0 P 3 (SUTURE) IMPLANT
SUT SILK 4 0 RB 1 (SUTURE) ×4 IMPLANT
SUT VIC AB 5-0 PS2 18 (SUTURE) ×2 IMPLANT
SYR CONTROL 10ML LL (SYRINGE) ×2 IMPLANT
TOWEL OR 17X26 4PK STRL BLUE (TOWEL DISPOSABLE) ×2 IMPLANT
WATER STERILE IRR 1000ML POUR (IV SOLUTION) ×2 IMPLANT

## 2017-08-31 NOTE — Transfer of Care (Signed)
Immediate Anesthesia Transfer of Care Note  Patient: Kathleen Mcclain  Procedure(s) Performed: DACROCYSTORHINOSTOMY (DCR) (Left Eye) ETHMOIDECTOMY, anterior (Left Eye)  Patient Location: PACU  Anesthesia Type:General  Level of Consciousness: awake, alert , sedated and patient cooperative  Airway & Oxygen Therapy: Patient Spontanous Breathing and Patient connected to nasal cannula oxygen  Post-op Assessment: Report given to RN, Post -op Vital signs reviewed and unstable, Anesthesiologist notified and Patient moving all extremities  Post vital signs: Reviewed and stable  Last Vitals:  Vitals Value Taken Time  BP 151/89 08/31/2017  9:36 AM  Temp    Pulse 96 08/31/2017  9:39 AM  Resp 10 08/31/2017  9:39 AM  SpO2 100 % 08/31/2017  9:39 AM  Vitals shown include unvalidated device data.  Last Pain:  Vitals:   08/31/17 0641  TempSrc: Oral  PainSc:          Complications: No apparent anesthesia complications

## 2017-08-31 NOTE — Anesthesia Postprocedure Evaluation (Signed)
Anesthesia Post Note  Patient: Kathleen Mcclain  Procedure(s) Performed: DACROCYSTORHINOSTOMY (DCR) (Left Eye) ETHMOIDECTOMY, anterior (Left Eye)     Patient location during evaluation: PACU Anesthesia Type: General Level of consciousness: awake Pain management: pain level controlled Vital Signs Assessment: post-procedure vital signs reviewed and stable Respiratory status: spontaneous breathing Cardiovascular status: stable Anesthetic complications: no    Last Vitals:  Vitals:   08/31/17 1005 08/31/17 1015  BP: (!) 156/85 (!) 160/84  Pulse: 78 71  Resp: 14 11  Temp:  (!) 36.2 C  SpO2: 100% 100%    Last Pain:  Vitals:   08/31/17 1015  TempSrc:   PainSc: 0-No pain                 Israella Hubert

## 2017-08-31 NOTE — Brief Op Note (Signed)
08/31/2017  7:04 AM  PATIENT:  Kathleen Mcclain  67 y.o. female  PRE-OPERATIVE DIAGNOSIS:  acquired stenosis left nasal lacimal duct  POST-OPERATIVE DIAGNOSIS:  * No post-op diagnosis entered *  PROCEDURE:  Procedure(s): DACROCYSTORHINOSTOMY (DCR) (N/A) ETHMOIDECTOMY, anterior (N/A) Crawford Stent Placement LEFT Side  SURGEON:  Surgeon(s) and Role:    * Krystan Northrop, Peyton Najjar, MD - Primary  PHYSICIAN ASSISTANT: none  ASSISTANTS: none   ANESTHESIA:   local and LMA  [id number]   BLOOD ADMINISTERED:none  DRAINS: none   LOCAL MEDICATIONS USED:  BUPIVICAINE  and LIDOCAINE   SPECIMEN:  No Specimen  DISPOSITION OF SPECIMEN:  N/A  COUNTS:  YES  TOURNIQUET:  * No tourniquets in log *  DICTATION: .Note written in EPIC  PLAN OF CARE: Discharge to home after PACU  PATIENT DISPOSITION:  PACU - hemodynamically stable.   Delay start of Pharmacological VTE agent (>24hrs) due to surgical blood loss or risk of bleeding: no

## 2017-08-31 NOTE — Op Note (Signed)
Procedure(s): DACROCYSTORHINOSTOMY (DCR) ETHMOIDECTOMY, anterior Procedure Note  Kathleen Mcclain female 67 y.o. 08/31/2017  Procedure(s) and Anesthesia Type:    * DACROCYSTORHINOSTOMY (DCR) - General    * ETHMOIDECTOMY, anterior - General    * Crawford Stent Placement   Surgeon(s) and Role:    * Clista Bernhardt, MD - Primary  Procedure Detail  DACROCYSTORHINOSTOMY (DCR), ETHMOIDECTOMY, anterior Indications: The patient was admitted to the hospital with a brief history of LEFT-sided nasolacrimal obstruction.  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 The patient now presents for repeat dacryocystorhinostomy and stent placement after discussing therapeutic alternatives.        Surgeon: Clista Bernhardt   Assistants: none Anesthesia: General endotracheal anesthesia and Local anesthesia 1% buffered lidocaine, with epinephrine, .75 bupivicaine  ASA Class: 3  Procedure Detail  Patient was brought to the room in supine position where the appropriate monitoring was put in place. Then the patient was prepped and draped in the usual standard fashion of plastic surgery. The right nasal cavity was packed with cottonoid's soaked in afrin.  Attention was turned to the area between the medial canthus and the nasal bridge which has been previously marked. A 15 blade was used to incise the area. Then blunt dissection was taken down to the level of the anterior lacrimal crest. Then the periosteal elevator was used to bluntly remove the periosteum and the lacrimal sac off of the lacrimal crest. The nasal cavity was opened in the previous dcr so the scar tissue was removed and then a limited. Then the kerrison rongeur was used to remove residual bone to allow for better visibility . The nasal mucosa was infiltrated with local  block. Then an anterior ethmoidectomy took place. Antention was turned to the puncta where a 1 bowman probe was used to ensure the appropriate position of the nasolacrimal intubation tubes. A beaver blade was used to create an opening into the nasal cavity through the nasal mucosa. Crawford tubes were then inserted through the upper and lower canalicular system and retrieved through the nasal cavity with a curved forcep. This was sutured to the lateral nasal mucosa with a 4-0 silk suture.  Hemostasis was maintained with monopolar and bipolar cautery.  Surgiflow was placed into the defect and then the incision was closed with 5-0 undyed vicryl and then the skin was closed in an interrupted fashion with 6-0 nylon. The stent was also secured superiorly with a 6-0 nylon by suturing the stent to the fissure canaliculae. Tobradex ointment was placed over the wound and it was covered with a steri-strip. Then the right nose was covered with an eyepad.  The patient tolerated the procedure well and was transferred to the PACU In stable condition.   Findings: Scarring and adhesions of upper nasolacrimal system   Estimated Blood Loss:  Minimal         Drains: none         Total IV Fluids: <1.5L  Blood Given: none          Specimens: none         Implants: none        Complications:  * No complications entered in OR log *         Disposition: PACU - hemodynamically stable.         Condition: stable

## 2017-08-31 NOTE — Anesthesia Procedure Notes (Signed)
Procedure Name: Intubation Date/Time: 08/31/2017 8:12 AM Performed by: Izora Gala, CRNA Pre-anesthesia Checklist: Patient identified, Emergency Drugs available, Suction available and Patient being monitored Patient Re-evaluated:Patient Re-evaluated prior to induction Oxygen Delivery Method: Circle system utilized Preoxygenation: Pre-oxygenation with 100% oxygen Induction Type: IV induction Ventilation: Mask ventilation without difficulty Laryngoscope Size: Miller and 3 Grade View: Grade II Tube type: Oral Tube size: 7.0 mm Number of attempts: 1 Airway Equipment and Method: Stylet and LTA kit utilized Placement Confirmation: ETT inserted through vocal cords under direct vision,  positive ETCO2 and breath sounds checked- equal and bilateral Secured at: 22 cm Tube secured with: Tape Dental Injury: Teeth and Oropharynx as per pre-operative assessment

## 2017-08-31 NOTE — Discharge Instructions (Addendum)
Please keep patch on for 24hrs. Do not get patch wet. Patch is OK to remove tomorrow morning.  °You will have a medicated steri-strip covering your sutures. Please keep this intact until your post-operative appointment. It is OK to shower with this strip on- avoid rubbing the area. °You will have a suture inside your nose.- Do not pick at it.  °Start Maxitrol Ophthalmic Drops 2 times a day for 2 weeks in the operated eye.  °

## 2017-08-31 NOTE — Interval H&P Note (Signed)
History and Physical Interval Note:  08/31/2017 7:02 AM  Kathleen Mcclain  has presented today for surgery, with the diagnosis of acquired stenosis left nasal lacimal duct  The various methods of treatment have been discussed with the patient and family. After consideration of risks, benefits and other options for treatment, the patient has consented to  Procedure(s): DACROCYSTORHINOSTOMY (DCR) (N/A) ETHMOIDECTOMY, anterior (N/A) as a surgical intervention .  The patient's history has been reviewed, patient examined, no change in status, stable for surgery.  I have reviewed the patient's chart and labs.  Questions were answered to the patient's satisfaction.     Clista Bernhardt

## 2017-09-01 ENCOUNTER — Encounter (HOSPITAL_COMMUNITY): Payer: Self-pay | Admitting: Oculoplastics Ophthalmology

## 2017-09-10 ENCOUNTER — Other Ambulatory Visit: Payer: Self-pay | Admitting: Physician Assistant

## 2017-09-18 ENCOUNTER — Ambulatory Visit: Payer: BLUE CROSS/BLUE SHIELD | Admitting: Physician Assistant

## 2017-09-18 ENCOUNTER — Encounter: Payer: Self-pay | Admitting: Physician Assistant

## 2017-09-18 ENCOUNTER — Other Ambulatory Visit: Payer: Self-pay

## 2017-09-18 VITALS — BP 136/88 | HR 80 | Temp 98.2°F | Resp 14 | Ht 64.0 in | Wt 122.4 lb

## 2017-09-18 DIAGNOSIS — E7800 Pure hypercholesterolemia, unspecified: Secondary | ICD-10-CM

## 2017-09-18 DIAGNOSIS — E039 Hypothyroidism, unspecified: Secondary | ICD-10-CM | POA: Diagnosis not present

## 2017-09-18 DIAGNOSIS — R739 Hyperglycemia, unspecified: Secondary | ICD-10-CM

## 2017-09-18 DIAGNOSIS — L729 Follicular cyst of the skin and subcutaneous tissue, unspecified: Secondary | ICD-10-CM

## 2017-09-18 NOTE — Progress Notes (Signed)
Patient ID: Kathleen Mcclain MRN: [id number], DOB: August 03, 1950, 67 y.o. Date of Encounter: 09/18/2017,   Chief Complaint: Physical (CPE)  HPI: 67 y.o. y/o white female  here for CPE.    03/09/2015: She presents for CPE. She is also being seen as a new patient to establish care. She was going to the [facility] but since it has closed she is establishing care here.  She sees a gynecologist on a routine basis. States that she also is seen at [facility] urology because of history of kidney stones. Says that she now goes there every 2 years. States that she also sees ophthalmologist routinely Dr. Posey Pronto  She states that several years ago she was told that she was prediabetic and was informed regarding diet and exercise changes.  She states that she loved sweets and carbs and made changes and lost 30 pounds. She continues to be very careful with her diet but says that she does not exercise.  She does not work outside of the home. She states that she has 2 sets of grandchildren. One set lives nearby and the other set lives at [place].  She is taking cholesterol medication as directed. No myalgias or other adverse effects. She is taking her thyroid medication daily.   03/22/2016: She reports that she has had a cyst on her back for a long time and it had not been bothering her. However says that it is getting bigger to where it is pressing on her bra area and is getting uncomfortable. He someone he can remove that. She has no other concerns to address today. She is taking cholesterol medication as directed. No myalgias or other adverse effects. She is taking her thyroid medication daily. She continues to be careful with her diet and is eating low-cholesterol and low sugar diet.  09/19/2016: She is taking cholesterol medication as directed. No myalgias or other adverse effects. She is taking her thyroid medication daily. She continues to be careful with her diet and is eating  low-cholesterol and low sugar diet. She continues to check her blood pressure at home and gets 191Y systolic. Is not getting any high readings at home. She has no other concerns to address today.  03/21/2017: She recently had surgery to her left eye.  Otherwise states that other than wise she has things have been stable from a medical stand point.  She has no specific concerns to address today. She is taking cholesterol medication as directed. No myalgias or other adverse effects. She is taking her thyroid medication daily. She continues to be careful with her diet and is eating low-cholesterol and low sugar diet. She continues to check her blood pressure at home and gets 782N systolic. Is not getting any high readings at home.   Addendum added 07/17/2017: Received note from Dr. Benson Norway for procedure date 07/16/17 for colonoscopy that date.  Revealed 5 polyps.  Await pathology results.  Repeat colonoscopy in 3-5 years.    09/18/2017: Today she reports that she had to have another surgery to her eye.  Says that they put a stent in -- they go up through her left nostril and into the tear duct of the left eye.  States that they leave it there for certain amount of time and then hopefully can remove it. Also states that in the past I had referred her to dermatology regarding cyst on her right back.  Says that she saw dermatology and they told her they no longer do take  care of that and that she needs to see general surgery so I will refer to general surgery today. Reviewed that she had her colonoscopy as above.  Says that she was told to repeat in 3 years. She has no other updates regarding her health/medical care today.  Otherwise things have been stable. Notes that she continues to check her blood pressure at home and is good getting good readings. She is taking her Lipitor daily.  This is causing no myalgias or other adverse effects. She is taking her Synthroid daily. Continues to be very careful  with her diet regarding cholesterol and sugar.  Weight is only 122 pound.    Review of Systems: Consitutional: No fever, chills, fatigue, night sweats, lymphadenopathy. No significant/unexplained weight changes. Eyes: ---Recent Eye Surgery-------------------------------------------------------------------------------------------------------------------------- ENT/Mouth: No ear pain, sore throat, nasal drainage, or sinus pain. Cardiovascular: No chest pressure,heaviness, tightness or squeezing, even with exertion. No increased shortness of breath or dyspnea on exertion.No palpitations, edema, orthopnea, PND. Respiratory: No cough, hemoptysis, SOB, or wheezing. Gastrointestinal: No anorexia, dysphagia, reflux, pain, nausea, vomiting, hematemesis, diarrhea, constipation, BRBPR, or melena. Breast: No mass, nodules, bulging, or retraction. No skin changes or inflammation. No nipple discharge. No lymphadenopathy. Genitourinary: No dysuria, hematuria, incontinence, vaginal discharge, pruritis, burning, abnormal bleeding, or pain. Musculoskeletal: No decreased ROM, No joint pain or swelling. No significant pain in neck, back, or extremities. Skin: No rash, pruritis. Neurological: No headache, dizziness, syncope, seizures, tremors, memory loss, coordination problems, or paresthesias. Psychological: No anxiety, depression, hallucinations, SI/HI. Endocrine: No polydipsia, polyphagia, polyuria, or known diabetes.No increased fatigue. No palpitations/rapid heart rate. No significant/unexplained weight change. All other systems were reviewed and are otherwise negative.  Past Medical History:  Diagnosis Date  . Allergy   . Arthritis   . 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 Cataract   . Glaucoma   . History of kidney stones   . Hypercholesteremia   . Hypothyroid   . Hypothyroidism   . Personal history of kidney stones   . Pre-diabetes      Past Surgical History:  Procedure Laterality  Date  . CERVICAL BIOPSY  12/07   pre cancerous cells  . COLONOSCOPY    . DACRORHINOCYSTOTOMY Left 08/31/2017   Procedure: DACROCYSTORHINOSTOMY (DCR);  Surgeon: Clista Bernhardt, MD;  Location: [place];  Service: Ophthalmology;  Laterality: Left;  . ETHMOIDECTOMY Left 08/31/2017   Procedure: ETHMOIDECTOMY, anterior;  Surgeon: Clista Bernhardt, MD;  Location: [place];  Service: Ophthalmology;  Laterality: Left;  . EYE SURGERY Left 05/2016   tear duct surgery  . TEAR DUCT PROBING Left 02/20/2017   Procedure: nasolacrimal duct obstruct dacryocystorhinostomy left eye, anterior ephmoidectomy left eye, probe with stent left eye;  Surgeon: Clista Bernhardt, MD;  Location: [place];  Service: Ophthalmology;  Laterality: Left;  . TONSILLECTOMY    . TUBAL LIGATION  5/86    Home Meds:  Outpatient Medications Prior to Visit  Medication Sig Dispense Refill  . atorvastatin (LIPITOR) 10 MG tablet TAKE 1 TABLET BY MOUTH EVERYDAY AT BEDTIME 30 tablet 2  . Carboxymeth-Glycerin-Polysorb (REFRESH OPTIVE ADVANCED) 0.5-1-0.5 % SOLN Place 1 drop into both eyes 4 (four) times daily as needed (dry eyes).     . cholecalciferol (VITAMIN D) 1000 units tablet Take 1,000 Units by mouth daily.    Marland Kitchen levothyroxine (SYNTHROID, LEVOTHROID) 50 MCG tablet Take 1 tablet (50 mcg total) by mouth daily. (Patient taking differently: Take 50 mcg by mouth See admin instructions. Take 1  tablet (50 mcg) by mouth on Sundays, Tuesdays, Thursdays, and Saturdays.) 90 tablet 0  . levothyroxine (SYNTHROID, LEVOTHROID) 75 MCG tablet TAKE 1 TABLET BY MOUTH DAILY ONLY ON MONDAY,WEDNESDAY AND FRIDAY (Patient taking differently: Take 75 mcg by mouth every Monday, Wednesday, and Friday. TAKE 1 TABLET BY MOUTH DAILY ONLY ON MONDAY,WEDNESDAY AND FRIDAY) 90 tablet 0  . Multiple Vitamin (MULTIVITAMIN) tablet Take 1 tablet by mouth daily.    . timolol (BETIMOL) 0.5 % ophthalmic solution Place 1 drop into both eyes 2 (two) times daily.     Marland Kitchen  tobramycin-dexamethasone (TOBRADEX) ophthalmic solution Place 1 drop into the left eye 2 (two) times daily.    . Travoprost, BAK Free, (TRAVATAN) 0.004 % SOLN ophthalmic solution Place 1 drop into both eyes at bedtime.     No facility-administered medications prior to visit.     Allergies:  Allergies  Allergen Reactions  . Penicillins Swelling    SWELLING REACTION UNSPECIFIED   PATIENT HAS HAD A PCN REACTION WITH IMMEDIATE RASH, FACIAL/TONGUE/THROAT SWELLING, SOB, OR LIGHTHEADEDNESS WITH HYPOTENSION:  #  #  #  YES  #  #  #   Has patient had a PCN reaction causing severe rash involving mucus membranes or skin necrosis: No Has patient had a PCN reaction that required hospitalization: No Has patient had a PCN reaction occurring within the last 10 years: No   . Alphagan [Brimonidine] Other (See Comments)    Opthalmic solution    Red itchy eyes    Social History   Socioeconomic History  . Marital status: Married    Spouse name: Not on file  . Number of children: Not on file  . Years of education: Not on file  . Highest education level: Not on file  Occupational History  . Not on file  Social Needs  . Financial resource strain: Not on file  . Food insecurity:    Worry: Not on file    Inability: Not on file  . Transportation needs:    Medical: Not on file    Non-medical: Not on file  Tobacco Use  . Smoking status: Never Smoker  . Smokeless tobacco: Never Used  Substance and Sexual Activity  . Alcohol use: No  . Drug use: No  . Sexual activity: Not on file  Lifestyle  . Physical activity:    Days per week: Not on file    Minutes per session: Not on file  . Stress: Not on file  Relationships  . Social connections:    Talks on phone: Not on file    Gets together: Not on file    Attends religious service: Not on file    Active member of club or organization: Not on file    Attends meetings of clubs or organizations: Not on file    Relationship status: Not on file  .  Intimate partner violence:    Fear of current or ex partner: Not on file    Emotionally abused: Not on file    Physically abused: Not on file    Forced sexual activity: Not on file  Other Topics Concern  . Not on file  Social History Narrative   Does not work outside of home.    Has 2 sets of grandchildren--1 set lives in this area. 1 set lives at [place].   Approximately 2013-- Was told "PreDiabetic"---She lost 30 pounds. Very careful with diet now. Does not exercise.    Family History  Problem Relation Age of Onset  .  Alzheimer's disease Mother   . Cancer Father        Pancreatic Cancer  . Hyperlipidemia Father   . Arthritis Maternal Grandmother   . Diabetes Maternal Grandfather   . Stroke Maternal Grandfather   . Cancer Paternal Grandmother   . Cancer Paternal Grandfather     Physical Exam: Blood pressure 136/88, pulse 80, temperature 98.2 F (36.8 C), temperature source Oral, resp. rate 14, height 5\' 4"  (1.626 m), weight 55.5 kg (122 lb 6.4 oz), SpO2 99 %., Body mass index is 21.01 kg/m. General:  WNWD WF. Appears in no acute distress. Neck: Supple. No thyromegaly. No lymphadenopathy. No carotid bruits Lungs: Clear bilaterally to auscultation without wheezes, rales, or rhonchi. Breathing is unlabored. Heart: RRR with S1 S2. No murmurs, rubs, or gallops. Abdomen: Soft, non-tender, non-distended with normoactive bowel sounds. No hepatomegaly. No rebound/guarding. No obvious abdominal masses. Musculoskeletal:  Strength and tone normal for age. Extremities/Skin: Warm and dry. No edema.  Right Back: At ~ T6 level--- there is ~ 1inch diameter cyst under the skin. Raised ~ 2 cm.  Neuro: Alert and oriented X 3. Moves all extremities spontaneously. Gait is normal. CNII-XII grossly in tact. Psych:  Responds to questions appropriately with a normal affect.   Assessment/Plan:  67 y.o. y/o female here for   Cyst of skin 09/18/2017:  - Ambulatory referral to General  Surgery   Hyperglycemia 02/2015: When she was diagnosed with this 2 years ago she lost 30 pounds. She is very compliant with diet. Says she is aware she is supposed to be exercising but says not do routine exercise.  Will check lab to monitor.  - Hemoglobin A1c 02/2016: Reassured patient that glucose and A1c recently performed  now normal. Continue diet changes. 09/19/2016----A1Cs checked since transferring her care to me have been normal. She has maintained weight loss. Will not recheck A1C now unless Glucose comes back high 03/21/2017: Last glucose -- 08/2016---was normal.A1Cs checked since transferring her care to me have been normal. She has maintained weight loss. Will not recheck A1C now unless Glucose comes back high 09/18/2017: Check glucose and A1c to monitor - COMPLETE METABOLIC PANEL WITH GFR - Hemoglobin A1c    Hypercholesteremia She states that her prior PCP was doing office visits and labs only once per year. She is on Lipitor 10 mg.  09/18/2017:  She is fasting. Recheck FLP and LFT to monitor.   Hypothyroidism, unspecified hypothyroidism type 02/2015: She states that her prior PCP was doing office visits and labs only once per year.  03/22/2016: She is on levothyroxine 75 g daily.   Recent TSH slightly out of normal range, but stable and barely out of normal range---cont current dose of thyroid medication.  - TSH 09/18/2017: ----recheck TSH to monitor.   White coat hypertension 02/2015: She states that she just bought a new blood pressure cuff for one year ago and knows that it reads accurately. States that at home she gets systolics 756E to 332R.  She will continue to monitor this at home. If readings do increase >140/90, she will follow-up with [place]. 02/2016: Discussed that her blood pressure and heart rate are running on the high side again today. She states that she has been checking this with her cuff at home and getting good readings. She states that her son's  girlfriend is in nursing school and she will have her check heart rate and blood pressure several times and will follow up with [place] if they are running high. 03/21/2017---Discussed  that again blood pressure is reading high here but she states that she continues to check blood pressure routinely and is getting good readings. 09/18/2017: Blood Pressure is actually reading good here today.  As well she has continued to get good readings at home.   03/21/2017: Reviewed preventive care and reviewed information below.   Updated information: She reports that her gynecologist is at [place].  Today I will have her sign a release form for them to send [place] copies of mammograms. Discussed colonoscopy today.  She states that this is due and she will call and schedule follow-up.  Prior was done with Dr. Benson Norway. Asked her about a estimate of when her Tdap would have been done.  She thinks probably around 2013 but really is not certain. Addendum added 07/17/2017: Received note from Dr. Benson Norway for procedure date 07/16/17 for colonoscopy that date.  Revealed 5 polyps.  Await pathology results.  Repeat colonoscopy in 3-5 years.  09/18/2017: She reports that she was informed to repeat colonoscopy 3 years.  -----------------THE FOLLOWING IS COPIED FROM HER CPE 03/22/2016------IS NOT ADDRESSED AT [facility] 09/19/2016-----------------------------  Visit for preventive health examination  A. Screening Labs: She recently came and had fasting labs. Reviewed those results today.  She was concerned about her sugar but I reassured her that her glucose and A1c are now reading normal.  B. Pap: This is managed by GYN. She states her next appointment there is in December.  C. Screening Mammogram: This is managed by GYN. She states her next appointment there is in December.   D. DEXA/BMD:  At her CPE 31--we discussed Fosamax-- Discussed that she had been on Fosamax in the past but it had been discontinued. She said that this  was stopped around the same time that she was having kidney stones and thought that it was stopped because it was affecting her calcium and kidney stones 02/2016--- her prior DEXA scan from[facility] is now showing up in epic this was performed 12/16/2009 and showed T-scores of - 2.4 and -2.0.  E. Colorectal Cancer Screening: She states that she has had one colonoscopy performed in the past and her next one is due in about 1 or 2 more years. She states that her husband had his colonoscopy by the same GI doctor and that the GI doctor does send a reminder because her husband just recently got his reminder and she knows that her colonoscopy is due 1 or 2 years after his. Addendum added 07/17/2017: Received note from Dr. Benson Norway for procedure date 07/16/17 for colonoscopy that date.  Revealed 5 polyps.  Await pathology results.  Repeat colonoscopy in 3-5 years.  09/18/2017: She reports that she was informed to repeat colonoscopy 3 years.  F. Immunizations:  Influenza: ---Given here 03/09/15. She has already received her flu vaccine here for the 2017 season. Tetanus:  02/2015--she reported: She states that she remembers when her grandchildren were babies she got the pertussis vaccine so it sounds like she would've gotten the T dap and this will be up to date. Pneumococcal:  She has no indication to require the pneumonia vaccines until age 72 Zostavax:  Given here 03/09/15    Her prior PCP was doing office visits and labs only once per year. Will plan follow-up in one year or sooner if needed.   Marin Olp [place], [place], [facility] 09/18/2017 8:28 AM

## 2017-09-19 LAB — COMPLETE METABOLIC PANEL WITH GFR
AG Ratio: 1.7 (calc) (ref 1.0–2.5)
ALT: 61 U/L — ABNORMAL HIGH (ref 6–29)
AST: 57 U/L — AB (ref 10–35)
Albumin: 4.2 g/dL (ref 3.6–5.1)
Alkaline phosphatase (APISO): 88 U/L (ref 33–130)
BUN: 10 mg/dL (ref 7–25)
CHLORIDE: 108 mmol/L (ref 98–110)
CO2: 26 mmol/L (ref 20–32)
CREATININE: 0.86 mg/dL (ref 0.50–0.99)
Calcium: 10 mg/dL (ref 8.6–10.4)
GFR, Est African American: 83 mL/min/{1.73_m2} (ref >=60)
GFR, Est Non African American: 71 mL/min/{1.73_m2} (ref >=60)
GLUCOSE: 97 mg/dL (ref 65–99)
Globulin: 2.5 g/dL (calc) (ref 1.9–3.7)
Potassium: 5.3 mmol/L (ref 3.5–5.3)
SODIUM: 143 mmol/L (ref 135–146)
Total Bilirubin: 1 mg/dL (ref 0.2–1.2)
Total Protein: 6.7 g/dL (ref 6.1–8.1)

## 2017-09-19 LAB — HEMOGLOBIN A1C
EAG (MMOL/L): 5.7 (calc)
Hgb A1c MFr Bld: 5.2 % of total Hgb (ref <5.7)
Mean Plasma Glucose: 103 (calc)

## 2017-09-19 LAB — LIPID PANEL
CHOL/HDL RATIO: 2.6 (calc) (ref <5.0)
CHOLESTEROL: 156 mg/dL (ref <200)
HDL: 61 mg/dL (ref >50)
LDL Cholesterol (Calc): 79 mg/dL (calc)
Non-HDL Cholesterol (Calc): 95 mg/dL (calc) (ref <130)
Triglycerides: 76 mg/dL (ref <150)

## 2017-09-19 LAB — TSH: TSH: 1 mIU/L (ref 0.40–4.50)

## 2017-09-20 ENCOUNTER — Other Ambulatory Visit: Payer: Self-pay

## 2017-09-20 DIAGNOSIS — R7989 Other specified abnormal findings of blood chemistry: Secondary | ICD-10-CM

## 2017-09-20 DIAGNOSIS — R945 Abnormal results of liver function studies: Principal | ICD-10-CM

## 2017-10-01 ENCOUNTER — Ambulatory Visit
Admission: RE | Admit: 2017-10-01 | Discharge: 2017-10-01 | Disposition: A | Discharge status: Home / Self Care | Payer: BLUE CROSS/BLUE SHIELD | Source: Ambulatory Visit | Attending: Physician Assistant | Admitting: Physician Assistant

## 2017-10-01 DIAGNOSIS — R7989 Other specified abnormal findings of blood chemistry: Secondary | ICD-10-CM

## 2017-10-01 DIAGNOSIS — R945 Abnormal results of liver function studies: Principal | ICD-10-CM

## 2017-10-02 ENCOUNTER — Other Ambulatory Visit: Payer: Self-pay | Admitting: Physician Assistant

## 2017-10-03 ENCOUNTER — Other Ambulatory Visit: Payer: Self-pay

## 2017-10-03 DIAGNOSIS — R945 Abnormal results of liver function studies: Principal | ICD-10-CM

## 2017-10-03 DIAGNOSIS — R7989 Other specified abnormal findings of blood chemistry: Secondary | ICD-10-CM

## 2017-10-09 ENCOUNTER — Other Ambulatory Visit: Payer: BLUE CROSS/BLUE SHIELD

## 2017-10-09 DIAGNOSIS — R7989 Other specified abnormal findings of blood chemistry: Secondary | ICD-10-CM

## 2017-10-09 DIAGNOSIS — R945 Abnormal results of liver function studies: Principal | ICD-10-CM

## 2017-10-15 LAB — ANA: Anti Nuclear Antibody(ANA): POSITIVE — AB

## 2017-10-15 LAB — IRON, TOTAL/TOTAL IRON BINDING CAP
%SAT: 39 % (ref 16–45)
Iron: 114 ug/dL (ref 45–160)
TIBC: 290 mcg/dL (calc) (ref 250–450)

## 2017-10-15 LAB — CERULOPLASMIN: CERULOPLASMIN: 32 mg/dL (ref 18–53)

## 2017-10-15 LAB — ANTI-MICROSOMAL ANTIBODY LIVER / KIDNEY: LKM1 Ab: <=20 U (ref <=20.0)

## 2017-10-15 LAB — ANTI-NUCLEAR AB-TITER (ANA TITER): ANA Titer 1: >=1:1280 {titer} — AB

## 2017-10-18 ENCOUNTER — Other Ambulatory Visit: Payer: Self-pay

## 2017-10-18 DIAGNOSIS — R768 Other specified abnormal immunological findings in serum: Secondary | ICD-10-CM

## 2017-11-18 NOTE — Progress Notes (Signed)
Office Visit Note  Patient: Kathleen Mcclain             Date of Birth: December 20, 1950           MRN: [id number]             PCP: Rennis Golden Referring: Orlena Sheldon, PA-C Visit Date: 11/28/2017 Occupation: @GUAROCC @  Subjective:  Positive ANA.   History of Present Illness: Kathleen Mcclain is a 67 y.o. female seen in consultation per request of her PCP.  According to patient she has been experiencing some fatigue and had thyroid issues for a long time.  She had some recent labs done at her PCPs office which showed elevation of LFTs and positive ANA.  She states she had an ultrasound of her liver which showed mild liver enlargement.  Because of positive ANA she was referred to me.  She denies any joint pain or joint swelling.  There is no history of any rash, Raynaud's phenomenon, photosensitivity.  She gives a history of sicca symptoms.  Activities of Daily Living:  Patient reports morning stiffness for 5-10 minutes.   Patient Reports nocturnal pain.  Difficulty dressing/grooming: Denies Difficulty climbing stairs: Denies Difficulty getting out of chair: Denies Difficulty using hands for taps, buttons, cutlery, and/or writing: Denies  Review of Systems  Constitutional: Positive for fatigue.  HENT: Positive for mouth dryness. Negative for mouth sores, trouble swallowing and trouble swallowing.   Eyes: Positive for dryness.  Respiratory: Negative for shortness of breath and difficulty breathing.   Cardiovascular: Negative for swelling in legs/feet.  Gastrointestinal: Positive for abdominal pain and constipation. Negative for diarrhea.  Endocrine: Positive for increased urination.  Genitourinary: Negative for pelvic pain.  Musculoskeletal: Positive for morning stiffness. Negative for arthralgias, joint pain and joint swelling.  Skin: Negative for rash.  Allergic/Immunologic: Negative for susceptible to infections.  Neurological: Negative for dizziness, light-headedness,  headaches, memory loss and weakness.  Hematological: Negative for bruising/bleeding tendency.  Psychiatric/Behavioral: Negative for confusion.    PMFS History:  Patient Active Problem List   Diagnosis Date Noted  . Essential hypertension 03/21/2017  . Hypothyroidism 03/09/2015  . Hyperglycemia 03/09/2015  . Allergy   . Cancer ([place])   . Cataract   . Glaucoma   . Hypercholesteremia   . Personal history of kidney stones     Past Medical History:  Diagnosis Date  . Allergy   . Arthritis   . Cancer ([place])    pre cancer cells cervical  . Cataract   . Glaucoma   . History of kidney stones   . Hypercholesteremia   . Hypothyroid   . Hypothyroidism   . Personal history of kidney stones   . Pre-diabetes     Family History  Problem Relation Age of Onset  . Alzheimer's disease Mother   . Cancer Father        Pancreatic Cancer  . Hyperlipidemia Father   . Arthritis Maternal Grandmother   . Diabetes Maternal Grandfather   . Stroke Maternal Grandfather   . Cancer Paternal Grandmother   . Cancer Paternal Grandfather   . Thyroid disease Sister   . Celiac disease Sister   . Thyroid disease Brother   . Anxiety disorder Son   . Healthy Son   . Healthy Daughter   . Healthy Daughter    Past Surgical History:  Procedure Laterality Date  . CERVICAL BIOPSY  12/07   pre cancerous cells  . COLONOSCOPY    . DACRORHINOCYSTOTOMY  Left 08/31/2017   Procedure: DACROCYSTORHINOSTOMY (DCR);  Surgeon: Clista Bernhardt, MD;  Location: [place];  Service: Ophthalmology;  Laterality: Left;  . ETHMOIDECTOMY Left 08/31/2017   Procedure: ETHMOIDECTOMY, anterior;  Surgeon: Clista Bernhardt, MD;  Location: [place];  Service: Ophthalmology;  Laterality: Left;  . EYE SURGERY Left    tear duct surgery, x4  . LIPOMA EXCISION     on back   . TEAR DUCT PROBING Left 02/20/2017   Procedure: nasolacrimal duct obstruct dacryocystorhinostomy left eye, anterior ephmoidectomy left eye, probe with stent left eye;   Surgeon: Clista Bernhardt, MD;  Location: [facility];  Service: Ophthalmology;  Laterality: Left;  . TONSILLECTOMY    . TUBAL LIGATION  5/86   Social History   Social History Narrative   Does not work outside of home.    Has 2 sets of grandchildren--1 set lives in this area. 1 set lives at [place] [facility].   Approximately 2013-- Was told "PreDiabetic"---She lost 30 pounds. Very careful with diet now. Does not exercise.    Objective: Vital Signs: BP (!) 146/80 (BP Location: Left Arm, Patient Position: Sitting, Cuff Size: Normal)   Pulse 86   Resp 12   Ht 5' 4.17" (1.63 m)   Wt 123 lb 9.6 oz (56.1 kg)   BMI 21.10 kg/m    Physical Exam  Constitutional: She is oriented to person, place, and time. She appears well-developed and well-nourished.  HENT:  Head: Normocephalic and atraumatic.  Eyes: Conjunctivae and EOM are normal.  Neck: Normal range of motion.  Cardiovascular: Normal rate, regular rhythm, normal heart sounds and intact distal pulses.  Pulmonary/Chest: Effort normal and breath sounds normal.  Abdominal: Soft. Bowel sounds are normal.  Lymphadenopathy:    She has no cervical adenopathy.  Neurological: She is alert and oriented to person, place, and time.  Skin: Skin is warm and dry. Capillary refill takes less than 2 seconds.  Psychiatric: She has a normal mood and affect. Her behavior is normal.  Nursing note and vitals reviewed.    Musculoskeletal Exam: C-spine thoracic lumbar spine good range of motion.  Shoulder joints elbow joints wrist joints MCPs PIPs DIPs been good range of motion with no synovitis.  Hip joints knee joints ankles MTPs PIPs were in good range of motion with no synovitis.  CDAI Exam: No CDAI exam completed.   Investigation: Findings:  09/18/2017: Lipid panel within normal limits, hemoglobin A1c 5.2, TSH 1.0 10/09/2017: Iron 114, TIBC 290, 30% saturation, ANA 1: 1280 homogeneous, Ceruloplasmin 32  Component     Latest Ref Rng & Units 09/18/2017  10/09/2017  Cholesterol     <200 mg/dL 156   HDL Cholesterol     >50 mg/dL 61   Triglycerides     <150 mg/dL 76   LDL Cholesterol (Calc)     mg/dL (calc) 79   Total CHOL/HDL Ratio     <5.0 (calc) 2.6   Non-HDL Cholesterol (Calc)     <130 mg/dL (calc) 95   Hemoglobin A1C     <5.7 % of total Hgb 5.2   Mean Plasma Glucose     (calc) 103   eAG (mmol/L)     (calc) 5.7   Iron     45 - 160 mcg/dL  114  TIBC     250 - 450 mcg/dL (calc)  290  %SAT     16 - 45 % (calc)  39  ANA Pattern 1       HOMOGENEOUS (  A)  ANA Titer 1     titer  > OR = 1:1280 (A)  TSH     0.40 - 4.50 mIU/L 1.00   Ceruloplasmin     18 - 53 mg/dL  32  LKM1 Ab     <=20.0 U  <=20.0  Anti Nuclear Antibody(ANA)     NEGATIVE  POSITIVE (A)   Imaging: No results found.  Recent Labs: Lab Results  Component Value Date   WBC 8.5 08/31/2017   HGB 14.0 08/31/2017   PLT 176 08/31/2017   NA 143 09/18/2017   K 5.3 09/18/2017   CL 108 09/18/2017   CO2 26 09/18/2017   GLUCOSE 97 09/18/2017   BUN 10 09/18/2017   CREATININE 0.86 09/18/2017   BILITOT 1.0 09/18/2017   ALKPHOS 79 09/19/2016   AST 57 (H) 09/18/2017   ALT 61 (H) 09/18/2017   PROT 6.7 09/18/2017   ALBUMIN 4.3 09/19/2016   CALCIUM 10.0 09/18/2017   GFRAA 83 09/18/2017    Speciality Comments: No specialty comments available.  Procedures:  No procedures performed Allergies: Penicillins and Alphagan [brimonidine]   Assessment / Plan:     Visit Diagnoses: Positive ANA (antinuclear antibody) -patient complains of fatigue and sicca symptoms.  She had no synovitis on examination.  Her ANA was positive.  I will obtain AVISE labs today.  10/09/2017: ANA 1: 1280 homogeneous, iron 114, TIBC 290, 30% saturation, Ceruloplasmin 32 - Plan: Urinalysis, Routine w reflex microscopic  Elevated LFTs -patient reports having an ultrasound which showed mild hepatomegaly.  It raises The concern of possible autoimmune hepatitis.  I will repeat her LFTs today.  Plan:  COMPLETE METABOLIC PANEL WITH GFR  Other fatigue - Plan: Sedimentation rate, CK, Serum protein electrophoresis with reflex  Essential hypertension  History of hypothyroidism  Personal history of kidney stones  History of glaucoma   Orders: Orders Placed This Encounter  Procedures  . COMPLETE METABOLIC PANEL WITH GFR  . Urinalysis, Routine w reflex microscopic  . Sedimentation rate  . CK  . Serum protein electrophoresis with reflex   No orders of the defined types were placed in this encounter.   Face-to-face time spent with patient was 45 minutes. Greater than 50% of time was spent in counseling and coordination of care.  Follow-Up Instructions: Return for Positive ANA.   Bo Merino, MD  Note - This record has been created using Editor, commissioning.  Chart creation errors have been sought, but may not always  have been located. Such creation errors do not reflect on  the standard of medical care.

## 2017-11-22 ENCOUNTER — Other Ambulatory Visit: Payer: Self-pay | Admitting: General Surgery

## 2017-11-28 ENCOUNTER — Ambulatory Visit (INDEPENDENT_AMBULATORY_CARE_PROVIDER_SITE_OTHER): Payer: BLUE CROSS/BLUE SHIELD | Admitting: Rheumatology

## 2017-11-28 ENCOUNTER — Encounter: Payer: Self-pay | Admitting: Rheumatology

## 2017-11-28 VITALS — BP 146/80 | HR 86 | Resp 12 | Ht 64.17 in | Wt 123.6 lb

## 2017-11-28 DIAGNOSIS — Z87442 Personal history of urinary calculi: Secondary | ICD-10-CM

## 2017-11-28 DIAGNOSIS — R7989 Other specified abnormal findings of blood chemistry: Secondary | ICD-10-CM

## 2017-11-28 DIAGNOSIS — R5383 Other fatigue: Secondary | ICD-10-CM

## 2017-11-28 DIAGNOSIS — Z8639 Personal history of other endocrine, nutritional and metabolic disease: Secondary | ICD-10-CM

## 2017-11-28 DIAGNOSIS — R768 Other specified abnormal immunological findings in serum: Secondary | ICD-10-CM

## 2017-11-28 DIAGNOSIS — I10 Essential (primary) hypertension: Secondary | ICD-10-CM | POA: Diagnosis not present

## 2017-11-28 DIAGNOSIS — R945 Abnormal results of liver function studies: Secondary | ICD-10-CM | POA: Diagnosis not present

## 2017-11-28 DIAGNOSIS — Z8669 Personal history of other diseases of the nervous system and sense organs: Secondary | ICD-10-CM

## 2017-12-03 LAB — URINALYSIS, ROUTINE W REFLEX MICROSCOPIC
BILIRUBIN URINE: NEGATIVE
Glucose, UA: NEGATIVE
HGB URINE DIPSTICK: NEGATIVE
KETONES UR: NEGATIVE
Leukocytes, UA: NEGATIVE
Nitrite: NEGATIVE
PH: 7.5 (ref 5.0–8.0)
Protein, ur: NEGATIVE
SPECIFIC GRAVITY, URINE: 1.021 (ref 1.001–1.03)

## 2017-12-03 LAB — COMPLETE METABOLIC PANEL WITH GFR
AG RATIO: 1.7 (calc) (ref 1.0–2.5)
ALBUMIN MSPROF: 4.5 g/dL (ref 3.6–5.1)
ALKALINE PHOSPHATASE (APISO): 84 U/L (ref 33–130)
ALT: 42 U/L — ABNORMAL HIGH (ref 6–29)
AST: 42 U/L — ABNORMAL HIGH (ref 10–35)
BUN: 16 mg/dL (ref 7–25)
CO2: 31 mmol/L (ref 20–32)
Calcium: 9.9 mg/dL (ref 8.6–10.4)
Chloride: 106 mmol/L (ref 98–110)
Creat: 0.82 mg/dL (ref 0.50–0.99)
GFR, EST NON AFRICAN AMERICAN: 76 mL/min/{1.73_m2} (ref >=60)
GFR, Est African American: 88 mL/min/{1.73_m2} (ref >=60)
GLOBULIN: 2.6 g/dL (ref 1.9–3.7)
Glucose, Bld: 94 mg/dL (ref 65–99)
POTASSIUM: 4.9 mmol/L (ref 3.5–5.3)
SODIUM: 144 mmol/L (ref 135–146)
Total Bilirubin: 1.6 mg/dL — ABNORMAL HIGH (ref 0.2–1.2)
Total Protein: 7.1 g/dL (ref 6.1–8.1)

## 2017-12-03 LAB — PROTEIN ELECTROPHORESIS, SERUM, WITH REFLEX
ALBUMIN ELP: 4.4 g/dL (ref 3.8–4.8)
Alpha 1: 0.3 g/dL (ref 0.2–0.3)
Alpha 2: 0.8 g/dL (ref 0.5–0.9)
BETA 2: 0.5 g/dL (ref 0.2–0.5)
Beta Globulin: 0.5 g/dL (ref 0.4–0.6)
Gamma Globulin: 1 g/dL (ref 0.8–1.7)
Total Protein: 7.4 g/dL (ref 6.1–8.1)

## 2017-12-03 LAB — CK: Total CK: 44 U/L (ref 29–143)

## 2017-12-03 LAB — IFE INTERPRETATION: IMMUNOFIX ELECTR INT: NOT DETECTED

## 2017-12-03 LAB — SEDIMENTATION RATE: Sed Rate: 6 mm/h (ref 0–30)

## 2017-12-03 NOTE — Progress Notes (Signed)
LFts have improved but is still mildly elevated.

## 2017-12-24 ENCOUNTER — Other Ambulatory Visit: Payer: Self-pay | Admitting: Physician Assistant

## 2018-01-02 ENCOUNTER — Ambulatory Visit (INDEPENDENT_AMBULATORY_CARE_PROVIDER_SITE_OTHER): Payer: BLUE CROSS/BLUE SHIELD | Admitting: Family Medicine

## 2018-01-02 DIAGNOSIS — Z23 Encounter for immunization: Secondary | ICD-10-CM

## 2018-01-03 NOTE — Progress Notes (Signed)
Office Visit Note  Patient: Kathleen Mcclain             Date of Birth: 07-Nov-1950           MRN: [id number]             PCP: Rennis Golden Referring: Orlena Sheldon, PA-C Visit Date: 01/16/2018 Occupation: '@GUAROCC' @  Subjective:  Fatigue, dry eyes.   History of Present Illness: Kathleen Mcclain is a 67 y.o. female with history of fatigue and dry eyes and dry mouth.  She states she has had increased fatigue in the last 2 to 3 months.  She also continues to have dry eyes.  She has sometimes discomfort in her hands which she describes over her MCP joints.  She has occasional nasal ulcers.  He denies any raynaud's phenominon.  Activities of Daily Living:  Patient reports morning stiffness for 5 minutes.   Patient Reports nocturnal pain.  Difficulty dressing/grooming: Denies Difficulty climbing stairs: Denies Difficulty getting out of chair: Denies Difficulty using hands for taps, buttons, cutlery, and/or writing: Denies  Review of Systems  Constitutional: Positive for fatigue. Negative for night sweats, weight gain and weight loss.  HENT: Positive for mouth dryness. Negative for mouth sores, trouble swallowing, trouble swallowing and nose dryness.   Eyes: Positive for dryness. Negative for pain, redness and visual disturbance.  Respiratory: Negative for cough, shortness of breath and difficulty breathing.   Cardiovascular: Negative for chest pain, palpitations, hypertension, irregular heartbeat and swelling in legs/feet.  Gastrointestinal: Positive for constipation. Negative for blood in stool and diarrhea.  Endocrine: Negative for increased urination.  Genitourinary: Negative for vaginal dryness.  Musculoskeletal: Positive for arthralgias, joint pain and morning stiffness. Negative for joint swelling, myalgias, muscle weakness, muscle tenderness and myalgias.  Skin: Positive for hair loss. Negative for color change, rash, skin tightness, ulcers and sensitivity to sunlight.    Allergic/Immunologic: Negative for susceptible to infections.  Neurological: Negative for dizziness, memory loss, night sweats and weakness.  Hematological: Negative for swollen glands.  Psychiatric/Behavioral: Negative for depressed mood and sleep disturbance. The patient is not nervous/anxious.     PMFS History:  Patient Active Problem List   Diagnosis Date Noted  . Essential hypertension 03/21/2017  . Hypothyroidism 03/09/2015  . Hyperglycemia 03/09/2015  . Allergy   . Cancer ([place])   . Cataract   . Glaucoma   . Hypercholesteremia   . Personal history of kidney stones     Past Medical History:  Diagnosis Date  . Allergy   . Arthritis   . Cancer ([place])    pre cancer cells cervical  . Cataract   . Glaucoma   . History of kidney stones   . Hypercholesteremia   . Hypothyroid   . Hypothyroidism   . Personal history of kidney stones   . Pre-diabetes     Family History  Problem Relation Age of Onset  . Alzheimer's disease Mother   . Cancer Father        Pancreatic Cancer  . Hyperlipidemia Father   . Arthritis Maternal Grandmother   . Diabetes Maternal Grandfather   . Stroke Maternal Grandfather   . Cancer Paternal Grandmother   . Cancer Paternal Grandfather   . Thyroid disease Sister   . Celiac disease Sister   . Thyroid disease Brother   . Anxiety disorder Son   . Healthy Son   . Healthy Daughter   . Healthy Daughter    Past Surgical History:  Procedure  Laterality Date  . CERVICAL BIOPSY  12/07   pre cancerous cells  . COLONOSCOPY    . DACRORHINOCYSTOTOMY Left 08/31/2017   Procedure: DACROCYSTORHINOSTOMY (DCR);  Surgeon: Clista Bernhardt, MD;  Location: [place];  Service: Ophthalmology;  Laterality: Left;  . ETHMOIDECTOMY Left 08/31/2017   Procedure: ETHMOIDECTOMY, anterior;  Surgeon: Clista Bernhardt, MD;  Location: [place];  Service: Ophthalmology;  Laterality: Left;  . EYE SURGERY Left    tear duct surgery, x4  . LIPOMA EXCISION     on back   . TEAR  DUCT PROBING Left 02/20/2017   Procedure: nasolacrimal duct obstruct dacryocystorhinostomy left eye, anterior ephmoidectomy left eye, probe with stent left eye;  Surgeon: Clista Bernhardt, MD;  Location: [place];  Service: Ophthalmology;  Laterality: Left;  . TONSILLECTOMY    . TUBAL LIGATION  5/86   Social History   Social History Narrative   Does not work outside of home.    Has 2 sets of grandchildren--1 set lives in this area. 1 set lives at [place] [facility].   Approximately 2013-- Was told "PreDiabetic"---She lost 30 pounds. Very careful with diet now. Does not exercise.    Objective: Vital Signs: BP (!) 153/84 (BP Location: Left Arm, Patient Position: Sitting, Cuff Size: Normal)   Pulse 82   Ht '5\' 4"'  (1.626 m)   Wt 125 lb 3.2 oz (56.8 kg)   BMI 21.49 kg/m    Physical Exam  Constitutional: She is oriented to person, place, and time. She appears well-developed and well-nourished.  HENT:  Head: Normocephalic and atraumatic.  Eyes: Conjunctivae and EOM are normal.  Neck: Normal range of motion.  Cardiovascular: Normal rate, regular rhythm, normal heart sounds and intact distal pulses.  Pulmonary/Chest: Effort normal and breath sounds normal.  Abdominal: Soft. Bowel sounds are normal.  Lymphadenopathy:    She has no cervical adenopathy.  Neurological: She is alert and oriented to person, place, and time.  Skin: Skin is warm and dry. Capillary refill takes less than 2 seconds.  Psychiatric: She has a normal mood and affect. Her behavior is normal.  Nursing note and vitals reviewed.    Musculoskeletal Exam: C-spine thoracic lumbar spine good range of motion.  Shoulder joints elbow joints wrist joint MCPs PIPs DIPs were in good range of motion.  She had mild tenderness over right second MCP joint without any synovitis.  Hip joints knee joints ankles MTPs PIPs were in good range of motion with no synovitis.  CDAI Exam: CDAI Score: Not documented Patient Global Assessment: Not  documented; Provider Global Assessment: Not documented Swollen: Not documented; Tender: Not documented Joint Exam   Not documented   There is currently no information documented on the homunculus. Go to the Rheumatology activity and complete the homunculus joint exam.  Investigation: Findings:  November 29, 2017 AVISE index -2.6 ANA 1: 640 homogeneous, double-stranded DNA 649, CB CAP negative, anticardiolipin negative, beta-2 GP 1-, RF negative, anti-CCP negative, anti-carbamylated protein positive, antithyroglobulin antibody negative, antithyroid peroxidase antibody positive   Imaging: No results found.  Recent Labs: Lab Results  Component Value Date   WBC 8.5 08/31/2017   HGB 14.0 08/31/2017   PLT 176 08/31/2017   NA 144 11/28/2017   K 4.9 11/28/2017   CL 106 11/28/2017   CO2 31 11/28/2017   GLUCOSE 94 11/28/2017   BUN 16 11/28/2017   CREATININE 0.82 11/28/2017   BILITOT 1.6 (H) 11/28/2017   ALKPHOS 79 09/19/2016   AST 42 (H) 11/28/2017  ALT 42 (H) 11/28/2017   PROT 7.1 11/28/2017   PROT 7.4 11/28/2017   ALBUMIN 4.3 09/19/2016   CALCIUM 9.9 11/28/2017   GFRAA 88 11/28/2017  UA-, ESR 6, CK 44, IFE negative Speciality Comments: No specialty comments available.  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  History of fatigue, sicca symptoms and hair loss.  We had detailed discussion regarding positive antibodies and their association with possible fatigue and sicca symptoms.  I discussed the option of trying Plaquenil for the next 3 to 4 months and see if her symptoms improve.  Indications side effects contraindications were discussed at length.  She will talk to her ophthalmologist to get clearance before starting Plaquenil.  My plan is to start on Plaquenil 200 mg twice daily Monday to Friday.  We  will check labs in a month and then every 3 months to monitor for drug toxicity.  Patient was counseled on the purpose, proper use, and adverse effects of hydroxychloroquine including nausea/diarrhea, skin rash, headaches, and sun sensitivity.  Discussed importance of annual eye exams while on hydroxychloroquine to monitor to ocular toxicity and discussed importance of frequent laboratory monitoring.  Provided patient with eye exam form for baseline ophthalmologic exam.  Provided patient with educational materials on hydroxychloroquine and answered all questions.  Patient consented to hydroxychloroquine.  Will upload consent in the media tab.    Elevated LFTs-patient has gained some weight in the last 1 year.  I am uncertain if the elevation of LFTs are related to weight gain or autoimmune process.  Weight loss diet and exercise was discussed.  We will monitor LFTs for right now.  Essential hypertension-blood pressure is elevated today.  I have advised her to monitor blood pressure closely.  Hypercholesteremia  Hypothyroidism, unspecified type  Personal history of kidney stones   Orders: No orders of the defined types were placed in this encounter.  No orders of the defined types were placed in this encounter.   Face-to-face time spent with patient was 30 minutes. Greater than 50% of time was spent in counseling and coordination of care.  Follow-Up Instructions: Return in about 3 months (around 04/17/2018) for Autoimmune disease.   Bo Merino, MD  Note - This record has been created using Editor, commissioning.  Chart creation errors have been sought, but may not always  have been located. Such creation errors do not reflect on  the standard of medical care.

## 2018-01-12 ENCOUNTER — Other Ambulatory Visit: Payer: Self-pay | Admitting: Physician Assistant

## 2018-01-16 ENCOUNTER — Encounter: Payer: Self-pay | Admitting: Rheumatology

## 2018-01-16 ENCOUNTER — Ambulatory Visit (INDEPENDENT_AMBULATORY_CARE_PROVIDER_SITE_OTHER): Payer: BLUE CROSS/BLUE SHIELD | Admitting: Rheumatology

## 2018-01-16 VITALS — BP 153/84 | HR 82 | Ht 64.0 in | Wt 125.2 lb

## 2018-01-16 DIAGNOSIS — Z87442 Personal history of urinary calculi: Secondary | ICD-10-CM

## 2018-01-16 DIAGNOSIS — E7800 Pure hypercholesterolemia, unspecified: Secondary | ICD-10-CM | POA: Diagnosis not present

## 2018-01-16 DIAGNOSIS — M359 Systemic involvement of connective tissue, unspecified: Secondary | ICD-10-CM

## 2018-01-16 DIAGNOSIS — R7989 Other specified abnormal findings of blood chemistry: Secondary | ICD-10-CM

## 2018-01-16 DIAGNOSIS — I10 Essential (primary) hypertension: Secondary | ICD-10-CM | POA: Diagnosis not present

## 2018-01-16 DIAGNOSIS — D8989 Other specified disorders involving the immune mechanism, not elsewhere classified: Secondary | ICD-10-CM

## 2018-01-16 DIAGNOSIS — R945 Abnormal results of liver function studies: Secondary | ICD-10-CM

## 2018-01-16 DIAGNOSIS — E039 Hypothyroidism, unspecified: Secondary | ICD-10-CM

## 2018-01-16 NOTE — Patient Instructions (Signed)
Hydroxychloroquine tablets What is this medicine? HYDROXYCHLOROQUINE (hye drox ee KLOR oh kwin) is used to treat rheumatoid arthritis and systemic lupus erythematosus. It is also used to treat malaria. This medicine may be used for other purposes; ask your health care provider or pharmacist if you have questions. COMMON BRAND NAME(S): Plaquenil, Quineprox What should I tell my health care provider before I take this medicine? They need to know if you have any of these conditions: -diabetes -eye disease, vision problems -G6PD deficiency -history of blood diseases -history of irregular heartbeat -if you often drink alcohol -kidney disease -liver disease -porphyria -psoriasis -seizures -an unusual or allergic reaction to chloroquine, hydroxychloroquine, other medicines, foods, dyes, or preservatives -pregnant or trying to get pregnant -breast-feeding How should I use this medicine? Take this medicine by mouth with a glass of water. Follow the directions on the prescription label. Avoid taking antacids within 4 hours of taking this medicine. It is best to separate these medicines by at least 4 hours. Do not cut, crush or chew this medicine. You can take it with or without food. If it upsets your stomach, take it with food. Take your medicine at regular intervals. Do not take your medicine more often than directed. Take all of your medicine as directed even if you think you are better. Do not skip doses or stop your medicine early. Talk to your pediatrician regarding the use of this medicine in children. While this drug may be prescribed for selected conditions, precautions do apply. Overdosage: If you think you have taken too much of this medicine contact a poison control center or emergency room at once. NOTE: This medicine is only for you. Do not share this medicine with others. What if I miss a dose? If you miss a dose, take it as soon as you can. If it is almost time for your next dose,  take only that dose. Do not take double or extra doses. What may interact with this medicine? Do not take this medicine with any of the following medications: -cisapride -dofetilide -dronedarone -live virus vaccines -penicillamine -pimozide -thioridazine -ziprasidone This medicine may also interact with the following medications: -ampicillin -antacids -cimetidine -cyclosporine -digoxin -medicines for diabetes, like insulin, glipizide, glyburide -medicines for seizures like carbamazepine, phenobarbital, phenytoin -mefloquine -methotrexate -other medicines that prolong the QT interval (cause an abnormal heart rhythm) -praziquantel This list may not describe all possible interactions. Give your health care provider a list of all the medicines, herbs, non-prescription drugs, or dietary supplements you use. Also tell them if you smoke, drink alcohol, or use illegal drugs. Some items may interact with your medicine. What should I watch for while using this medicine? Tell your doctor or healthcare professional if your symptoms do not start to get better or if they get worse. Avoid taking antacids within 4 hours of taking this medicine. It is best to separate these medicines by at least 4 hours. Tell your doctor or health care professional right away if you have any change in your eyesight. Your vision and blood may be tested before and during use of this medicine. This medicine can make you more sensitive to the sun. Keep out of the sun. If you cannot avoid being in the sun, wear protective clothing and use sunscreen. Do not use sun lamps or tanning beds/booths. What side effects may I notice from receiving this medicine? Side effects that you should report to your doctor or health care professional as soon as possible: -allergic reactions like skin rash,   itching or hives, swelling of the face, lips, or tongue -changes in vision -decreased hearing or ringing of the ears -redness,  blistering, peeling or loosening of the skin, including inside the mouth -seizures -sensitivity to light -signs and symptoms of a dangerous change in heartbeat or heart rhythm like chest pain; dizziness; fast or irregular heartbeat; palpitations; feeling faint or lightheaded, falls; breathing problems -signs and symptoms of liver injury like dark yellow or brown urine; general ill feeling or flu-like symptoms; light-colored stools; loss of appetite; nausea; right upper belly pain; unusually weak or tired; yellowing of the eyes or skin -signs and symptoms of low blood sugar such as feeling anxious; confusion; dizziness; increased hunger; unusually weak or tired; sweating; shakiness; cold; irritable; headache; blurred vision; fast heartbeat; loss of consciousness -uncontrollable head, mouth, neck, arm, or leg movements Side effects that usually do not require medical attention (report to your doctor or health care professional if they continue or are bothersome): -anxious -diarrhea -dizziness -hair loss -headache -irritable -loss of appetite -nausea, vomiting -stomach pain This list may not describe all possible side effects. Call your doctor for medical advice about side effects. You may report side effects to FDA at 1-800-FDA-1088. Where should I keep my medicine? Keep out of the reach of children. In children, this medicine can cause overdose with small doses. Store at room temperature between 15 and 30 degrees C (59 and 86 degrees F). Protect from moisture and light. Throw away any unused medicine after the expiration date. NOTE: This sheet is a summary. It may not cover all possible information. If you have questions about this medicine, talk to your doctor, pharmacist, or health care provider.  2018 Elsevier/Gold Standard (2015-11-23 14:16:15) Standing Labs We placed an order today for your standing lab work.    Please come back and get your standing labs in 1 month after starting  Plaquenil then every 3 months  We have open lab Monday through Friday from 8:30-11:30 AM and 1:30-4:00 PM  at the office of Dr. Bo Merino.   You may experience shorter wait times on Monday and Friday afternoons. The office is located at [address], [place], [place], [place] [address] No appointment is necessary.   Labs are drawn by [facility].  You may receive a bill from [facility] for your lab work. If you have any questions regarding directions or hours of operation,  please call [phone].   Just as a reminder please drink plenty of water prior to coming for your lab work. Thanks!  Eye exam baseline and then yearly

## 2018-01-16 NOTE — Progress Notes (Signed)
Pharmacy Note  Patient was counseled on the purpose, proper use, and adverse effects of hydroxychloroquine including nausea/diarrhea, skin rash, headaches, and sun sensitivity.  Discussed importance of annual eye exams while on hydroxychloroquine to monitor to ocular toxicity and discussed importance of frequent laboratory monitoring.  Provided patient with eye exam form for baseline ophthalmologic exam.  Provided patient with educational materials on hydroxychloroquine and answered all questions.  Patient consented to hydroxychloroquine.  Will upload consent in the media tab.     She is concerned about starting the Plaquenil without consulting her ophthalmologist since she has glaucoma and has had several eye surgeries.  Patient is to follow-up with ophthalmologist and  if he is okay with her starting Plaquenil and will give our office a call after her appointment.  Will send prescription to pharmacy if patient wants to proceed with taking Plaquenil.  All questions encouraged and answered.  Mariella Saa, PharmD, [facility] Rheumatology Clinical Pharmacist  01/16/2018 4:21 PM

## 2018-03-14 DIAGNOSIS — H10413 Chronic giant papillary conjunctivitis, bilateral: Secondary | ICD-10-CM | POA: Diagnosis not present

## 2018-03-14 DIAGNOSIS — H04123 Dry eye syndrome of bilateral lacrimal glands: Secondary | ICD-10-CM | POA: Diagnosis not present

## 2018-03-14 DIAGNOSIS — H401132 Primary open-angle glaucoma, bilateral, moderate stage: Secondary | ICD-10-CM | POA: Diagnosis not present

## 2018-03-14 DIAGNOSIS — H16223 Keratoconjunctivitis sicca, not specified as Sjogren's, bilateral: Secondary | ICD-10-CM | POA: Diagnosis not present

## 2018-03-18 ENCOUNTER — Other Ambulatory Visit: Payer: Self-pay | Admitting: Family Medicine

## 2018-03-18 MED ORDER — LEVOTHYROXINE SODIUM 50 MCG PO TABS: 50.0000 ug | ORAL_TABLET | Freq: Every day | ORAL | 0 refills | Status: DC

## 2018-03-19 ENCOUNTER — Ambulatory Visit: Payer: BLUE CROSS/BLUE SHIELD | Admitting: Physician Assistant

## 2018-04-01 ENCOUNTER — Other Ambulatory Visit: Payer: Self-pay | Admitting: *Deleted

## 2018-04-01 ENCOUNTER — Encounter: Payer: Self-pay | Admitting: *Deleted

## 2018-04-01 MED ORDER — ATORVASTATIN CALCIUM 10 MG PO TABS: ORAL_TABLET | ORAL | 1 refills | Status: DC

## 2018-04-02 ENCOUNTER — Ambulatory Visit (INDEPENDENT_AMBULATORY_CARE_PROVIDER_SITE_OTHER): Payer: Medicare Other | Admitting: Family Medicine

## 2018-04-02 ENCOUNTER — Other Ambulatory Visit: Payer: Self-pay

## 2018-04-02 ENCOUNTER — Encounter: Payer: Self-pay | Admitting: Family Medicine

## 2018-04-02 VITALS — BP 138/74 | HR 72 | Temp 98.4°F | Resp 14 | Ht 64.0 in | Wt 128.0 lb

## 2018-04-02 DIAGNOSIS — R7989 Other specified abnormal findings of blood chemistry: Secondary | ICD-10-CM | POA: Insufficient documentation

## 2018-04-02 DIAGNOSIS — H409 Unspecified glaucoma: Secondary | ICD-10-CM

## 2018-04-02 DIAGNOSIS — I10 Essential (primary) hypertension: Secondary | ICD-10-CM | POA: Diagnosis not present

## 2018-04-02 DIAGNOSIS — M359 Systemic involvement of connective tissue, unspecified: Secondary | ICD-10-CM

## 2018-04-02 DIAGNOSIS — E7800 Pure hypercholesterolemia, unspecified: Secondary | ICD-10-CM | POA: Diagnosis not present

## 2018-04-02 DIAGNOSIS — E039 Hypothyroidism, unspecified: Secondary | ICD-10-CM

## 2018-04-02 DIAGNOSIS — Z23 Encounter for immunization: Secondary | ICD-10-CM

## 2018-04-02 DIAGNOSIS — R945 Abnormal results of liver function studies: Secondary | ICD-10-CM

## 2018-04-02 DIAGNOSIS — K760 Fatty (change of) liver, not elsewhere classified: Secondary | ICD-10-CM

## 2018-04-02 NOTE — Assessment & Plan Note (Signed)
No specific syndrome, reviewed rheumatology note

## 2018-04-02 NOTE — Progress Notes (Signed)
Subjective:    Patient ID: Kathleen Mcclain, female    DOB: Sep 13, 1950, 67 y.o.   MRN: [id number]  Patient presents for Follow-up (is fasting) Here to follow-up her medications.  She is also establishing care with myself as our physician assistant has retired.  She is followed by GYN - [facility]    Ophthalmologist Dr. Patel-treated for glaucoma, pressures have been elevated, if not improving at next visit may need surgical intervention  [facility] secondary to kidney stones, she is seen 2 years   Recently seen by rheumatology secondary to positive ANA after she had elevated liver function test resulting in an liver ultrasound that showed hepatic steatosis with also concern for underlying liver parenchymal disease.  After evaluation by rheumatology noting that she had sicca symptoms along with fatigue her diagnosis is autoimmune disease nonspecific she is currently on Plaquenil.  Her blood pressure was also a little elevated at 153/84  - Did not start plaquenil due to eye toxicity concerns, she has F/U in January    Hypocholesteremia-she is on her Lipitor without any adverse reactions, father had high cholesterol   Hypothyroidism-been on thyroid replacement for greater than 5 years, TSH was at goal 6 months ago , 75MCG M,W,F 3mcg all other days  No thyroid surgery  Many family members have thyroid disorder   Her last set of labs from our office was done 6 months ago at that time her A1c was normal at 5.2%   Review Of Systems:  GEN- denies fatigue, fever, weight loss,weakness, recent illness HEENT- denies eye drainage, change in vision, nasal discharge, CVS- denies chest pain, palpitations RESP- denies SOB, cough, wheeze ABD- denies N/V, change in stools, abd pain GU- denies dysuria, hematuria, dribbling, incontinence MSK- denies joint pain, muscle aches, injury Neuro- denies headache, dizziness, syncope, seizure activity       Objective:    BP 138/74    Pulse 72   Temp 98.4 F (36.9 C) (Oral)   Resp 14   Ht 5\' 4"  (1.626 m)   Wt 128 lb (58.1 kg)   SpO2 99%   BMI 21.97 kg/m  GEN- NAD, alert and oriented x3 HEENT- PERRL, EOMI, non injected sclera, pink conjunctiva, MMM, oropharynx clear Neck- Supple, no thyromegaly CVS- RRR, no murmur RESP-CTAB ABD-NABS,soft,NT,ND EXT- No edema Pulses- Radial, DP- 2+        Assessment & Plan:      Problem List Items Addressed This Visit      Unprioritized   Autoimmune disease ([place])    No specific syndrome, reviewed rheumatology note       Elevated liver function tests    Secondary to fatty liver Recheck LFT as they have been trending down to normal Has been on statin drug for many years Underlying liver disease ruled out      Essential hypertension - Primary    Well controlled no changes      Glaucoma   Hepatic steatosis   Relevant Orders   Lipid panel   Comprehensive metabolic panel   Hypercholesteremia   Relevant Orders   Lipid panel   Comprehensive metabolic panel   Hypothyroidism    Continue synthroid      Relevant Orders   TSH    Other Visit Diagnoses    Need for prophylactic vaccination against Streptococcus pneumoniae (pneumococcus)       Relevant Orders   Pneumococcal conjugate vaccine 13-valent IM (Completed)      Note: This dictation was prepared with  Dragon dictation along with smaller Company secretary. Any transcriptional errors that result from this process are unintentional.

## 2018-04-02 NOTE — Patient Instructions (Signed)
F/U 3-4 months for [employer]

## 2018-04-02 NOTE — Assessment & Plan Note (Signed)
Secondary to fatty liver Recheck LFT as they have been trending down to normal Has been on statin drug for many years Underlying liver disease ruled out

## 2018-04-02 NOTE — Assessment & Plan Note (Signed)
Well controlled no changes 

## 2018-04-02 NOTE — Assessment & Plan Note (Signed)
Continue synthroid.

## 2018-04-03 LAB — COMPREHENSIVE METABOLIC PANEL
AG Ratio: 1.6 (calc) (ref 1.0–2.5)
ALBUMIN MSPROF: 4.4 g/dL (ref 3.6–5.1)
ALT: 35 U/L — AB (ref 6–29)
AST: 39 U/L — ABNORMAL HIGH (ref 10–35)
Alkaline phosphatase (APISO): 83 U/L (ref 33–130)
BUN: 11 mg/dL (ref 7–25)
CO2: 29 mmol/L (ref 20–32)
Calcium: 9.9 mg/dL (ref 8.6–10.4)
Chloride: 107 mmol/L (ref 98–110)
Creat: 0.91 mg/dL (ref 0.50–0.99)
Globulin: 2.8 g/dL (calc) (ref 1.9–3.7)
Glucose, Bld: 89 mg/dL (ref 65–99)
Potassium: 5.1 mmol/L (ref 3.5–5.3)
Sodium: 145 mmol/L (ref 135–146)
Total Bilirubin: 1.4 mg/dL — ABNORMAL HIGH (ref 0.2–1.2)
Total Protein: 7.2 g/dL (ref 6.1–8.1)

## 2018-04-03 LAB — LIPID PANEL
Cholesterol: 179 mg/dL (ref <200)
HDL: 66 mg/dL (ref >50)
LDL Cholesterol (Calc): 93 mg/dL (calc)
Non-HDL Cholesterol (Calc): 113 mg/dL (calc) (ref <130)
Total CHOL/HDL Ratio: 2.7 (calc) (ref <5.0)
Triglycerides: 106 mg/dL (ref <150)

## 2018-04-03 LAB — TSH: TSH: 3.21 m[IU]/L (ref 0.40–4.50)

## 2018-04-17 NOTE — Progress Notes (Signed)
Office Visit Note  Patient: Kathleen Mcclain             Date of Birth: July 29, 1950           MRN: [id number]             PCP: Alycia Rossetti, MD Referring: Orlena Sheldon, PA-C Visit Date: 04/29/2018 Occupation: @GUAROCC @  Subjective:  Fatigue.    History of Present Illness: Kathleen Mcclain is a 67 y.o. female and sicca symptoms.  She is discussed use of Plaquenil with an ophthalmologist and was advised against it because of history of glaucoma.  She continues to have some sicca symptoms for which she is using over-the-counter products.  She has not had any significant hair loss since the last visit.  Not had any significant fatigue since the last visit.  She has some joint pain but no joint swelling.    Activities of Daily Living:  Patient reports morning stiffness for 0 minutes.   Patient Reports nocturnal pain.  Difficulty dressing/grooming: Denies Difficulty climbing stairs: Denies Difficulty getting out of chair: Denies Difficulty using hands for taps, buttons, cutlery, and/or writing: Denies  Review of Systems  Constitutional: Negative for fatigue.  HENT: Positive for mouth dryness. Negative for mouth sores, trouble swallowing and trouble swallowing.   Eyes: Positive for dryness. Negative for pain, redness and itching.  Respiratory: Negative for shortness of breath, wheezing and difficulty breathing.   Cardiovascular: Negative for chest pain, palpitations and swelling in legs/feet.  Gastrointestinal: Negative for abdominal pain, blood in stool, constipation, diarrhea, nausea and vomiting.  Endocrine: Negative for increased urination.  Genitourinary: Negative for painful urination, nocturia and pelvic pain.  Musculoskeletal: Negative for arthralgias, joint pain, joint swelling and morning stiffness.  Skin: Negative for rash and hair loss.  Allergic/Immunologic: Negative for susceptible to infections.  Neurological: Negative for dizziness, light-headedness, headaches,  memory loss and weakness.  Hematological: Negative for bruising/bleeding tendency.  Psychiatric/Behavioral: Negative for confusion. The patient is not nervous/anxious.     PMFS History:  Patient Active Problem List   Diagnosis Date Noted  . Hepatic steatosis 04/02/2018  . Elevated liver function tests 04/02/2018  . Autoimmune disease ([place]) 04/02/2018  . Essential hypertension 03/21/2017  . Hypothyroidism 03/09/2015  . Hyperglycemia 03/09/2015  . Allergy   . Cataract   . Glaucoma   . Hypercholesteremia   . Personal history of kidney stones     Past Medical History:  Diagnosis Date  . Allergy   . Arthritis   . 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 Cataract   . Glaucoma   . History of kidney stones   . Hypercholesteremia   . Hypothyroid   . Hypothyroidism   . Personal history of kidney stones   . Pre-diabetes     Family History  Problem Relation Age of Onset  . Alzheimer's disease Mother   . Cancer Father        Pancreatic Cancer  . Hyperlipidemia Father   . Arthritis Maternal Grandmother   . Diabetes Maternal Grandfather   . Stroke Maternal Grandfather   . Cancer Paternal Grandmother   . Cancer Paternal Grandfather   . Thyroid disease Sister   . Celiac disease Sister   . Thyroid disease Brother   . Anxiety disorder Son   . Healthy Son   . Healthy Daughter   . Healthy Daughter    Past Surgical History:  Procedure Laterality Date  . CERVICAL BIOPSY  12/07  pre cancerous cells  . COLONOSCOPY    . DACRORHINOCYSTOTOMY Left 08/31/2017   Procedure: DACROCYSTORHINOSTOMY (DCR);  Surgeon: Clista Bernhardt, MD;  Location: [facility];  Service: Ophthalmology;  Laterality: Left;  . ETHMOIDECTOMY Left 08/31/2017   Procedure: ETHMOIDECTOMY, anterior;  Surgeon: Clista Bernhardt, MD;  Location: [facility];  Service: Ophthalmology;  Laterality: Left;  . EYE SURGERY Left    tear duct surgery, x4  . LIPOMA EXCISION     on back   . TEAR DUCT PROBING Left 02/20/2017    Procedure: nasolacrimal duct obstruct dacryocystorhinostomy left eye, anterior ephmoidectomy left eye, probe with stent left eye;  Surgeon: Clista Bernhardt, MD;  Location: [facility];  Service: Ophthalmology;  Laterality: Left;  . TONSILLECTOMY    . TUBAL LIGATION  5/86   Social History   Social History Narrative   Does not work outside of home.    Has 2 sets of grandchildren--1 set lives in this area. 1 set lives at [place].   Approximately 2013-- Was told "PreDiabetic"---She lost 30 pounds. Very careful with diet now. Does not exercise.    Objective: Vital Signs: BP (!) 164/91 (BP Location: Left Arm, Patient Position: Sitting, Cuff Size: Normal)   Pulse 74   Resp 12   Ht 5\' 4"  (1.626 m)   Wt 131 lb 6.4 oz (59.6 kg)   BMI 22.55 kg/m    Physical Exam Vitals signs and nursing note reviewed.  Constitutional:      Appearance: She is well-developed.  HENT:     Head: Normocephalic and atraumatic.  Eyes:     Conjunctiva/sclera: Conjunctivae normal.  Neck:     Musculoskeletal: Normal range of motion.  Cardiovascular:     Rate and Rhythm: Normal rate and regular rhythm.     Heart sounds: Normal heart sounds.  Pulmonary:     Effort: Pulmonary effort is normal.     Breath sounds: Normal breath sounds.  Abdominal:     General: Bowel sounds are normal.     Palpations: Abdomen is soft.  Lymphadenopathy:     Cervical: No cervical adenopathy.  Skin:    General: Skin is warm and dry.     Capillary Refill: Capillary refill takes less than 2 seconds.  Neurological:     Mental Status: She is alert and oriented to person, place, and time.  Psychiatric:        Behavior: Behavior normal.      Musculoskeletal Exam: C-spine thoracic lumbar spine good range of motion.  Shoulder joints elbow joints wrist joint.  Good range of motion with no synovitis.  MCPs PIPs DIPs with good range of motion with no synovitis.  Hip joints knee joints ankles MTPs PIPs with good range of motion with no  synovitis.  CDAI Exam: CDAI Score: Not documented Patient Global Assessment: Not documented; Provider Global Assessment: Not documented Swollen: Not documented; Tender: Not documented Joint Exam   Not documented   There is currently no information documented on the homunculus. Go to the Rheumatology activity and complete the homunculus joint exam.  Investigation: No additional findings.  Imaging: No results found.  Recent Labs: Lab Results  Component Value Date   WBC 8.5 08/31/2017   HGB 14.0 08/31/2017   PLT 176 08/31/2017   NA 145 04/02/2018   K 5.1 04/02/2018   CL 107 04/02/2018   CO2 29 04/02/2018   GLUCOSE 89 04/02/2018   BUN 11 04/02/2018   CREATININE 0.91 04/02/2018   BILITOT 1.4 (H)  04/02/2018   ALKPHOS 79 09/19/2016   AST 39 (H) 04/02/2018   ALT 35 (H) 04/02/2018   PROT 7.2 04/02/2018   ALBUMIN 4.3 09/19/2016   CALCIUM 9.9 04/02/2018   GFRAA 88 11/28/2017    Speciality Comments: No specialty comments available.  Procedures:  No procedures performed Allergies: Penicillins and Alphagan [brimonidine]   Assessment / Plan:     Visit Diagnoses: Sicca syndrome, unspecified ([place]) - Positive ANA, positive double-stranded DNA, AVISE index -2.6.  History of fatigue, sicca symptoms and hair loss.  Patient states her sicca symptoms are tolerable with over-the-counter products.  She does not have any other symptoms now.  Advised her to contact me in case her symptoms get worse.  Otherwise she will come back in 1 year.  Elevated LFTs-she has mildly elevated LFTs most likely due to statin use.  Hypercholesteremia-she is on statins.  Essential hypertension  Personal history of kidney stones  Hypothyroidism, unspecified type   Orders: No orders of the defined types were placed in this encounter.  No orders of the defined types were placed in this encounter.     Follow-Up Instructions: No follow-ups on file.   Bo Merino, MD  Note - This record has  been created using Editor, commissioning.  Chart creation errors have been sought, but may not always  have been located. Such creation errors do not reflect on  the standard of medical care.

## 2018-04-21 ENCOUNTER — Other Ambulatory Visit: Payer: Self-pay | Admitting: Family Medicine

## 2018-04-21 ENCOUNTER — Telehealth: Payer: Self-pay | Admitting: Family Medicine

## 2018-04-21 MED ORDER — LEVOTHYROXINE SODIUM 75 MCG PO TABS: ORAL_TABLET | ORAL | 0 refills | Status: DC

## 2018-04-21 NOTE — Telephone Encounter (Signed)
Received a fax from [facility].  Pharmacy:[facility] [address]  Medication:levothyroxine 70 mcg tablet  Qty:90  GOT:LXBW 1 tablet by mouth daily only on Monday, Wednesday, and Friday  Physician:Olean Ree Dixon  Last filled: 01/13/2018  Patient's phone number:[phone]

## 2018-04-22 ENCOUNTER — Other Ambulatory Visit: Payer: Self-pay | Admitting: Family Medicine

## 2018-04-22 MED ORDER — LEVOTHYROXINE SODIUM 50 MCG PO TABS: 50.0000 ug | ORAL_TABLET | Freq: Every day | ORAL | 1 refills | Status: DC

## 2018-04-22 MED ORDER — LEVOTHYROXINE SODIUM 75 MCG PO TABS: ORAL_TABLET | ORAL | 0 refills | Status: DC

## 2018-04-22 NOTE — Addendum Note (Signed)
Addended by: Sheral Flow on: 04/22/2018 12:38 PM   Modules accepted: Orders

## 2018-04-22 NOTE — Telephone Encounter (Signed)
Prescription sent to pharmacy in prior message.

## 2018-04-29 ENCOUNTER — Encounter: Payer: Self-pay | Admitting: Rheumatology

## 2018-04-29 ENCOUNTER — Ambulatory Visit (INDEPENDENT_AMBULATORY_CARE_PROVIDER_SITE_OTHER): Payer: Medicare Other | Admitting: Rheumatology

## 2018-04-29 VITALS — BP 164/91 | HR 74 | Resp 12 | Ht 64.0 in | Wt 131.4 lb

## 2018-04-29 DIAGNOSIS — M3500 Sicca syndrome, unspecified: Secondary | ICD-10-CM

## 2018-04-29 DIAGNOSIS — Z87442 Personal history of urinary calculi: Secondary | ICD-10-CM | POA: Diagnosis not present

## 2018-04-29 DIAGNOSIS — R7989 Other specified abnormal findings of blood chemistry: Secondary | ICD-10-CM

## 2018-04-29 DIAGNOSIS — I10 Essential (primary) hypertension: Secondary | ICD-10-CM

## 2018-04-29 DIAGNOSIS — E039 Hypothyroidism, unspecified: Secondary | ICD-10-CM | POA: Diagnosis not present

## 2018-04-29 DIAGNOSIS — R945 Abnormal results of liver function studies: Secondary | ICD-10-CM | POA: Diagnosis not present

## 2018-04-29 DIAGNOSIS — E7800 Pure hypercholesterolemia, unspecified: Secondary | ICD-10-CM

## 2018-05-12 DIAGNOSIS — H1012 Acute atopic conjunctivitis, left eye: Secondary | ICD-10-CM | POA: Diagnosis not present

## 2018-05-26 DIAGNOSIS — H1013 Acute atopic conjunctivitis, bilateral: Secondary | ICD-10-CM | POA: Diagnosis not present

## 2018-06-17 DIAGNOSIS — H401132 Primary open-angle glaucoma, bilateral, moderate stage: Secondary | ICD-10-CM | POA: Diagnosis not present

## 2018-06-17 DIAGNOSIS — H16223 Keratoconjunctivitis sicca, not specified as Sjogren's, bilateral: Secondary | ICD-10-CM | POA: Diagnosis not present

## 2018-06-17 DIAGNOSIS — H04123 Dry eye syndrome of bilateral lacrimal glands: Secondary | ICD-10-CM | POA: Diagnosis not present

## 2018-06-17 DIAGNOSIS — H10413 Chronic giant papillary conjunctivitis, bilateral: Secondary | ICD-10-CM | POA: Diagnosis not present

## 2018-06-27 DIAGNOSIS — Z124 Encounter for screening for malignant neoplasm of cervix: Secondary | ICD-10-CM | POA: Diagnosis not present

## 2018-06-27 DIAGNOSIS — Z1231 Encounter for screening mammogram for malignant neoplasm of breast: Secondary | ICD-10-CM | POA: Diagnosis not present

## 2018-07-07 DIAGNOSIS — H401132 Primary open-angle glaucoma, bilateral, moderate stage: Secondary | ICD-10-CM | POA: Diagnosis not present

## 2018-09-11 DIAGNOSIS — H04123 Dry eye syndrome of bilateral lacrimal glands: Secondary | ICD-10-CM | POA: Diagnosis not present

## 2018-10-14 ENCOUNTER — Ambulatory Visit (INDEPENDENT_AMBULATORY_CARE_PROVIDER_SITE_OTHER): Payer: Medicare Other | Admitting: Family Medicine

## 2018-10-14 ENCOUNTER — Other Ambulatory Visit: Payer: Self-pay

## 2018-10-14 ENCOUNTER — Encounter: Payer: Self-pay | Admitting: Family Medicine

## 2018-10-14 VITALS — BP 134/70 | HR 76 | Temp 98.6°F | Resp 12 | Ht 64.0 in | Wt 127.0 lb

## 2018-10-14 DIAGNOSIS — E7800 Pure hypercholesterolemia, unspecified: Secondary | ICD-10-CM | POA: Diagnosis not present

## 2018-10-14 DIAGNOSIS — E039 Hypothyroidism, unspecified: Secondary | ICD-10-CM | POA: Diagnosis not present

## 2018-10-14 DIAGNOSIS — M359 Systemic involvement of connective tissue, unspecified: Secondary | ICD-10-CM | POA: Diagnosis not present

## 2018-10-14 DIAGNOSIS — K760 Fatty (change of) liver, not elsewhere classified: Secondary | ICD-10-CM

## 2018-10-14 DIAGNOSIS — I10 Essential (primary) hypertension: Secondary | ICD-10-CM

## 2018-10-14 MED ORDER — ATORVASTATIN CALCIUM 10 MG PO TABS: ORAL_TABLET | ORAL | 1 refills | Status: DC

## 2018-10-14 MED ORDER — LEVOTHYROXINE SODIUM 75 MCG PO TABS: ORAL_TABLET | ORAL | 0 refills | Status: DC

## 2018-10-14 MED ORDER — LEVOTHYROXINE SODIUM 50 MCG PO TABS: 50.0000 ug | ORAL_TABLET | Freq: Every day | ORAL | 1 refills | Status: DC

## 2018-10-14 NOTE — Progress Notes (Signed)
   Subjective:    Patient ID: Kathleen Mcclain, female    DOB: 03-06-1951, 68 y.o.   MRN: [id number]  Patient presents for Follow-up (is fasting)  Pt here to f/u chronic medical problems  Medications reviewed     Not on plaquenil as she does have any again symptoms of her autoimmune disorder with the exception of dry mouth and eyes from the sicca syndrome.  She uses biotin for the mouth and she uses eyedrops.    Hyperlipidemia- taking course prescribed no side effects with the medication.  Ha soreness inher neck a few  weeks ago.  Not sure she had enlarged lymph nodes or not.  No difficulty swallowing.  She does have hypothyroidism.          Review Of Systems:  GEN- denies fatigue, fever, weight loss,weakness, recent illness HEENT- denies eye drainage, change in vision, nasal discharge, CVS- denies chest pain, palpitations RESP- denies SOB, cough, wheeze ABD- denies N/V, change in stools, abd pain GU- denies dysuria, hematuria, dribbling, incontinence MSK- denies joint pain, muscle aches, injury Neuro- denies headache, dizziness, syncope, seizure activity       Objective:    BP 134/70   Pulse 76   Temp 98.6 F (37 C) (Oral)   Resp 12   Ht 5\' 4"  (1.626 m)   Wt 127 lb (57.6 kg)   SpO2 100%   BMI 21.80 kg/m  GEN- NAD, alert and oriented x3 HEENT- PERRL, EOMI, non injected sclera, pink conjunctiva, MMM, oropharynx clear Neck- Supple, no thyromegaly, no LAD CVS- RRR, no murmur RESP-CTAB ABD-NABS,soft,NT,ND EXT- No edema Pulses- Radial 2+        Assessment & Plan:      Problem List Items Addressed This Visit      Unprioritized   Autoimmune disease ([place])    She is being treated for the dry mouth syndrome component but she is not on Plaquenil.      Essential hypertension - Primary    Blood pressures well controlled      Relevant Orders   CBC with Differential/Platelet   Comprehensive metabolic panel   Hepatic steatosis   Hypercholesteremia   Relevant Orders   Lipid panel   Hypothyroidism    Her thyroid function studies.  Her neck exam was benign today.  Advised that she does get a swelling or nodule to call me and ultrasound will be done.      Relevant Orders   TSH   T4, free      Note: This dictation was prepared with Dragon dictation along with smaller phrase technology. Any transcriptional errors that result from this process are unintentional.

## 2018-10-14 NOTE — Assessment & Plan Note (Signed)
Blood pressures well controlled. 

## 2018-10-14 NOTE — Assessment & Plan Note (Signed)
Her thyroid function studies.  Her neck exam was benign today.  Advised that she does get a swelling or nodule to call me and ultrasound will be done.

## 2018-10-14 NOTE — Assessment & Plan Note (Signed)
She is being treated for the dry mouth syndrome component but she is not on Plaquenil.

## 2018-10-14 NOTE — Patient Instructions (Signed)
F/U 6 month Medicare physical  We will call with results

## 2018-10-15 LAB — CBC WITH DIFFERENTIAL/PLATELET
Absolute Monocytes: 513 cells/uL (ref 200–950)
Basophils Absolute: 40 cells/uL (ref 0–200)
Basophils Relative: 0.7 %
Eosinophils Absolute: 188 cells/uL (ref 15–500)
Eosinophils Relative: 3.3 %
HCT: 42.8 % (ref 35.0–45.0)
Hemoglobin: 14 g/dL (ref 11.7–15.5)
Lymphs Abs: 1391 cells/uL (ref 850–3900)
MCH: 29.2 pg (ref 27.0–33.0)
MCHC: 32.7 g/dL (ref 32.0–36.0)
MCV: 89.4 fL (ref 80.0–100.0)
MPV: 12.6 fL — ABNORMAL HIGH (ref 7.5–12.5)
Monocytes Relative: 9 %
Neutro Abs: 3568 cells/uL (ref 1500–7800)
Neutrophils Relative %: 62.6 %
Platelets: 184 10*3/uL (ref 140–400)
RBC: 4.79 10*6/uL (ref 3.80–5.10)
RDW: 12.2 % (ref 11.0–15.0)
Total Lymphocyte: 24.4 %
WBC: 5.7 10*3/uL (ref 3.8–10.8)

## 2018-10-15 LAB — COMPREHENSIVE METABOLIC PANEL
AG Ratio: 1.7 (calc) (ref 1.0–2.5)
ALT: 17 U/L (ref 6–29)
AST: 24 U/L (ref 10–35)
Albumin: 4.3 g/dL (ref 3.6–5.1)
Alkaline phosphatase (APISO): 73 U/L (ref 37–153)
BUN: 12 mg/dL (ref 7–25)
CO2: 28 mmol/L (ref 20–32)
Calcium: 10.1 mg/dL (ref 8.6–10.4)
Chloride: 107 mmol/L (ref 98–110)
Creat: 0.85 mg/dL (ref 0.50–0.99)
Globulin: 2.5 g/dL (calc) (ref 1.9–3.7)
Glucose, Bld: 100 mg/dL — ABNORMAL HIGH (ref 65–99)
Potassium: 5.2 mmol/L (ref 3.5–5.3)
Sodium: 143 mmol/L (ref 135–146)
Total Bilirubin: 1.9 mg/dL — ABNORMAL HIGH (ref 0.2–1.2)
Total Protein: 6.8 g/dL (ref 6.1–8.1)

## 2018-10-15 LAB — LIPID PANEL
Cholesterol: 155 mg/dL (ref <200)
HDL: 67 mg/dL (ref >=50)
LDL Cholesterol (Calc): 71 mg/dL (calc)
Non-HDL Cholesterol (Calc): 88 mg/dL (calc) (ref <130)
Total CHOL/HDL Ratio: 2.3 (calc) (ref <5.0)
Triglycerides: 83 mg/dL (ref <150)

## 2018-10-15 LAB — T4, FREE: Free T4: 1.4 ng/dL (ref 0.8–1.8)

## 2018-10-15 LAB — TSH: TSH: 1.8 mIU/L (ref 0.40–4.50)

## 2018-10-17 ENCOUNTER — Encounter: Payer: Self-pay | Admitting: *Deleted

## 2018-10-17 DIAGNOSIS — H401132 Primary open-angle glaucoma, bilateral, moderate stage: Secondary | ICD-10-CM | POA: Diagnosis not present

## 2018-10-20 DIAGNOSIS — H401132 Primary open-angle glaucoma, bilateral, moderate stage: Secondary | ICD-10-CM | POA: Diagnosis not present

## 2019-01-20 ENCOUNTER — Other Ambulatory Visit: Payer: Self-pay

## 2019-01-20 ENCOUNTER — Ambulatory Visit (INDEPENDENT_AMBULATORY_CARE_PROVIDER_SITE_OTHER): Payer: Medicare Other

## 2019-01-20 DIAGNOSIS — Z23 Encounter for immunization: Secondary | ICD-10-CM

## 2019-02-23 DIAGNOSIS — H401132 Primary open-angle glaucoma, bilateral, moderate stage: Secondary | ICD-10-CM | POA: Diagnosis not present

## 2019-02-24 DIAGNOSIS — H524 Presbyopia: Secondary | ICD-10-CM | POA: Diagnosis not present

## 2019-02-24 DIAGNOSIS — H04123 Dry eye syndrome of bilateral lacrimal glands: Secondary | ICD-10-CM | POA: Diagnosis not present

## 2019-02-24 DIAGNOSIS — H16223 Keratoconjunctivitis sicca, not specified as Sjogren's, bilateral: Secondary | ICD-10-CM | POA: Diagnosis not present

## 2019-02-24 DIAGNOSIS — H10413 Chronic giant papillary conjunctivitis, bilateral: Secondary | ICD-10-CM | POA: Diagnosis not present

## 2019-02-24 DIAGNOSIS — H5213 Myopia, bilateral: Secondary | ICD-10-CM | POA: Diagnosis not present

## 2019-02-24 DIAGNOSIS — H401132 Primary open-angle glaucoma, bilateral, moderate stage: Secondary | ICD-10-CM | POA: Diagnosis not present

## 2019-02-24 DIAGNOSIS — H52223 Regular astigmatism, bilateral: Secondary | ICD-10-CM | POA: Diagnosis not present

## 2019-02-28 IMAGING — US US ABDOMEN LIMITED
1 series · 14 of 25 positions shown · non-contrast
Comparison: None.

CLINICAL DATA: Elevated liver enzymes

EXAM:
ULTRASOUND ABDOMEN LIMITED RIGHT UPPER QUADRANT

[Series 1: us abdomen limited · 0.16mm/px · 14 of 59 slices shown]
[im 1/59]
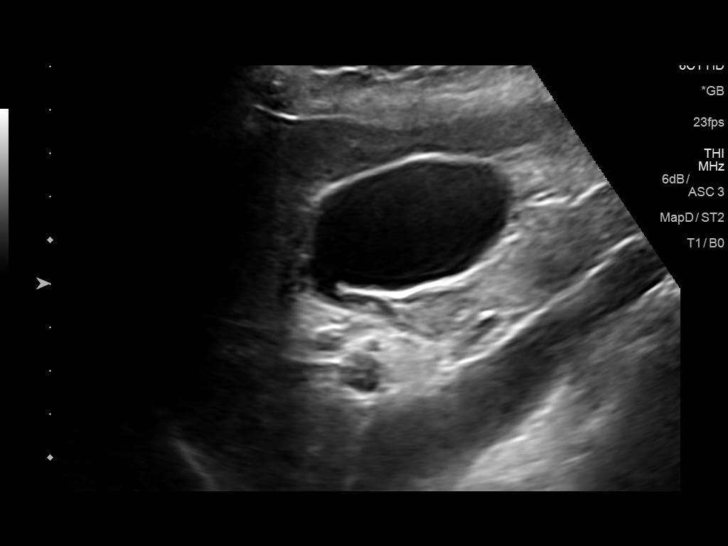
[im 5/59]
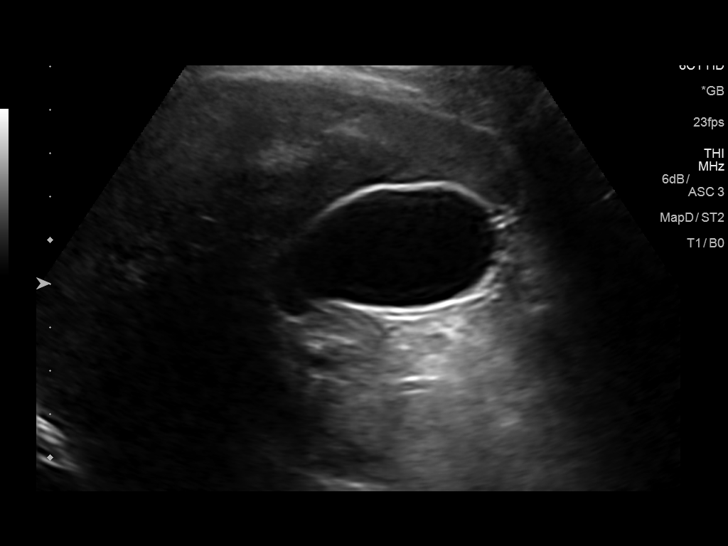
[im 10/59]
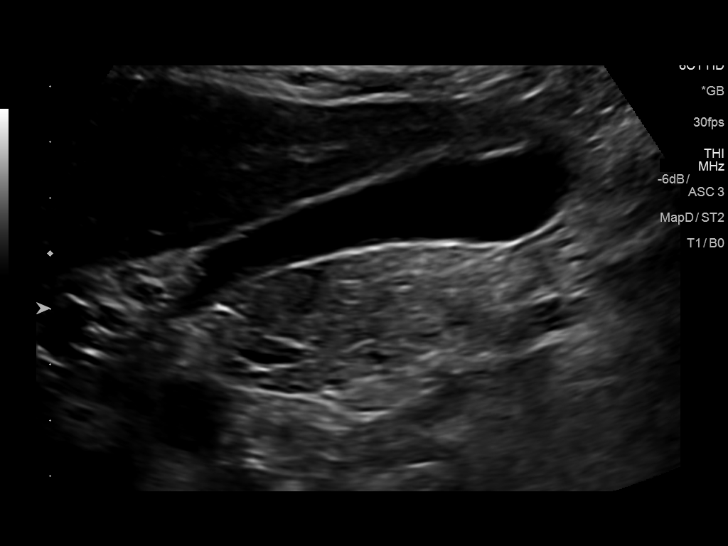
[im 15/59]
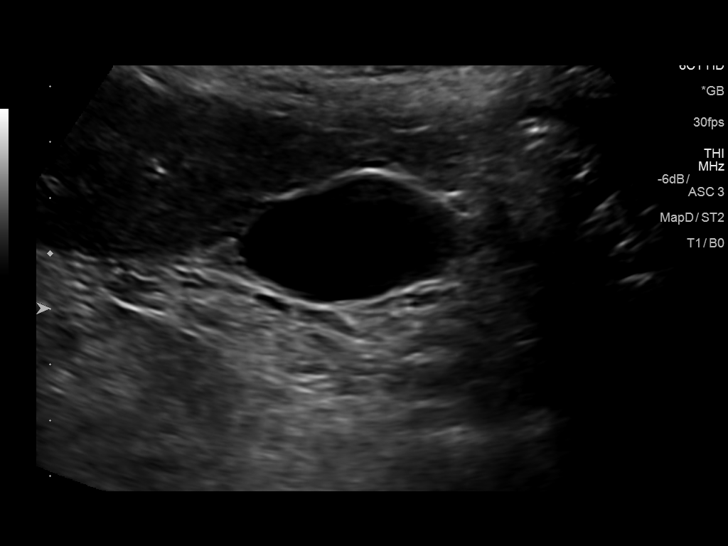
[im 20/59]
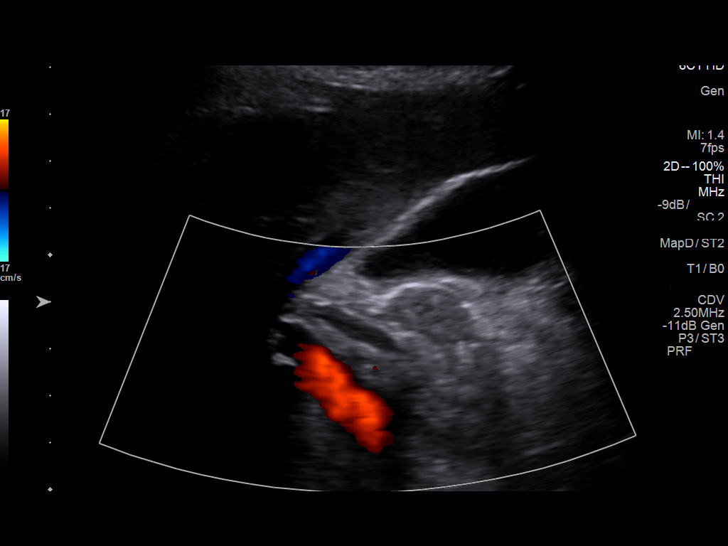
[im 22/59]
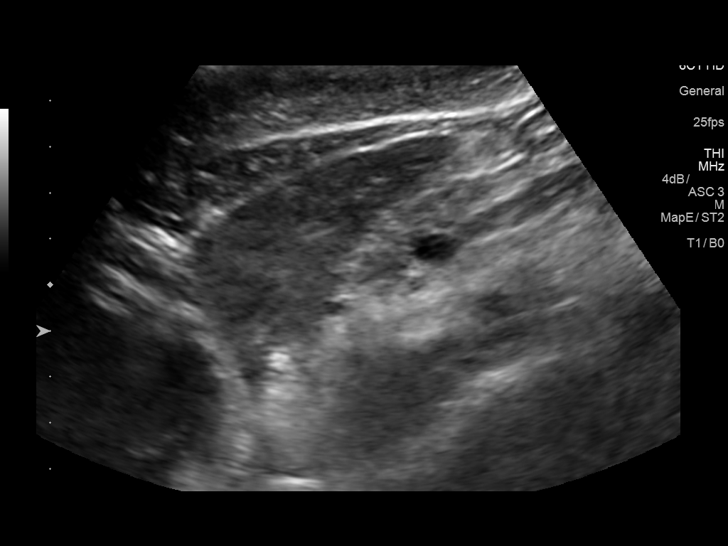
[im 27/59]
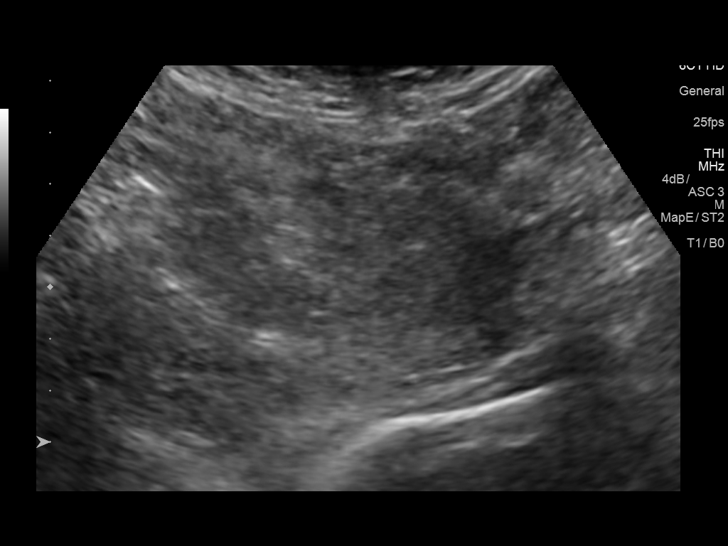
[im 32/59]
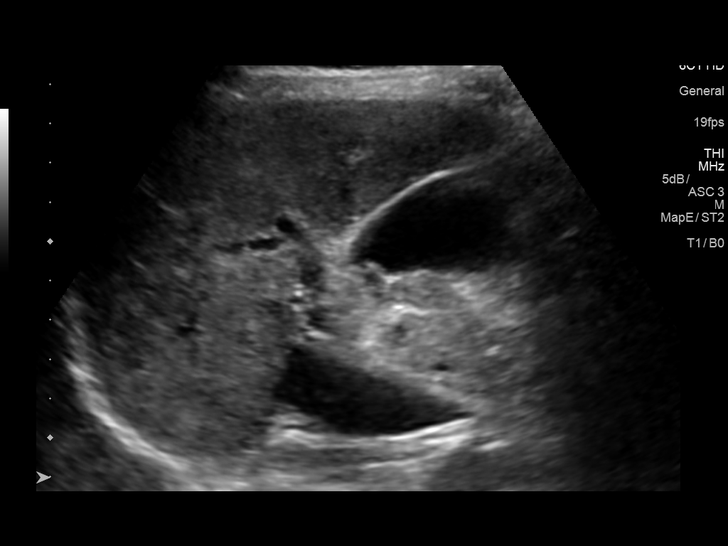
[im 37/59]
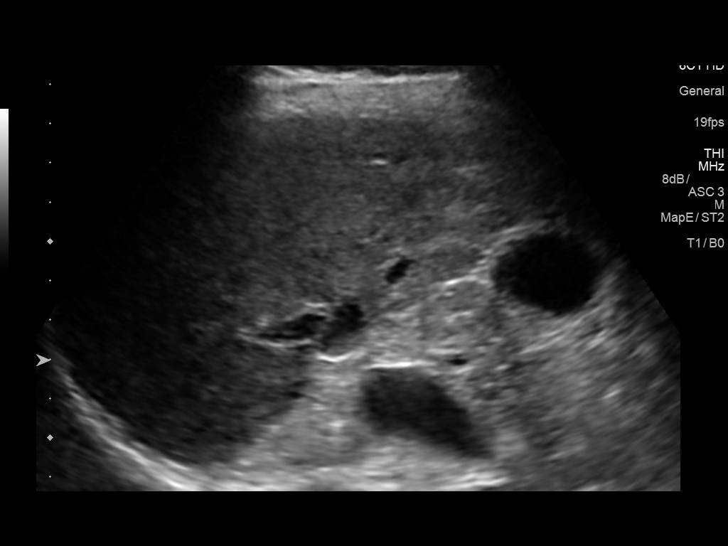
[im 39/59]
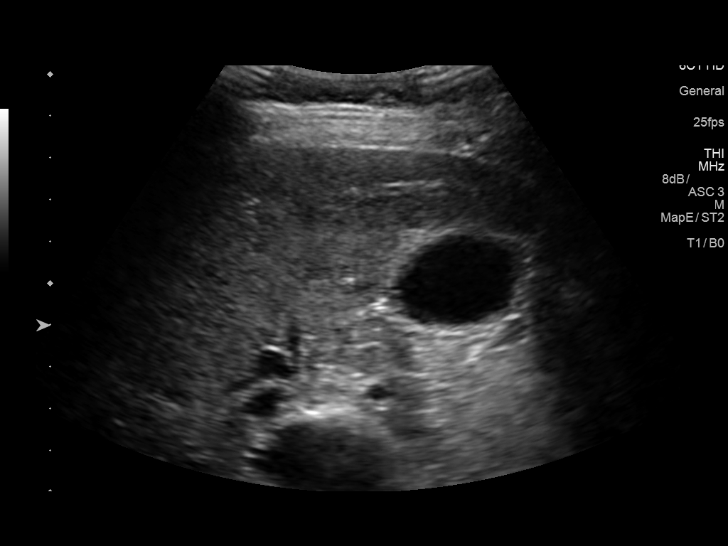
[im 44/59]
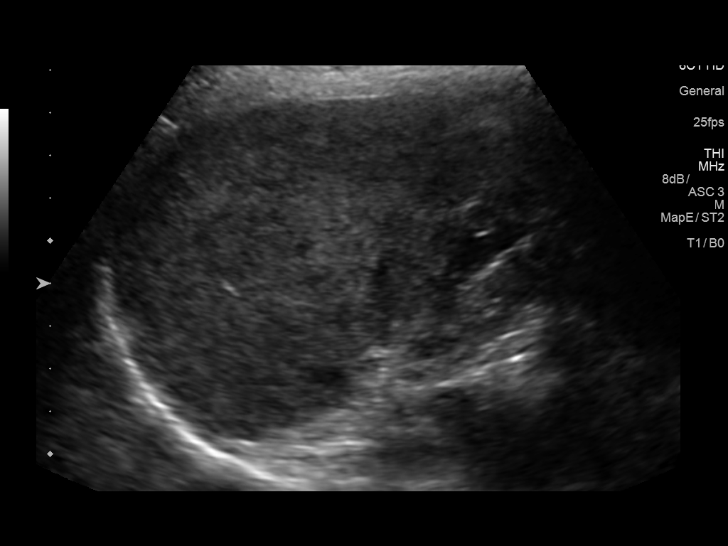
[im 49/59]
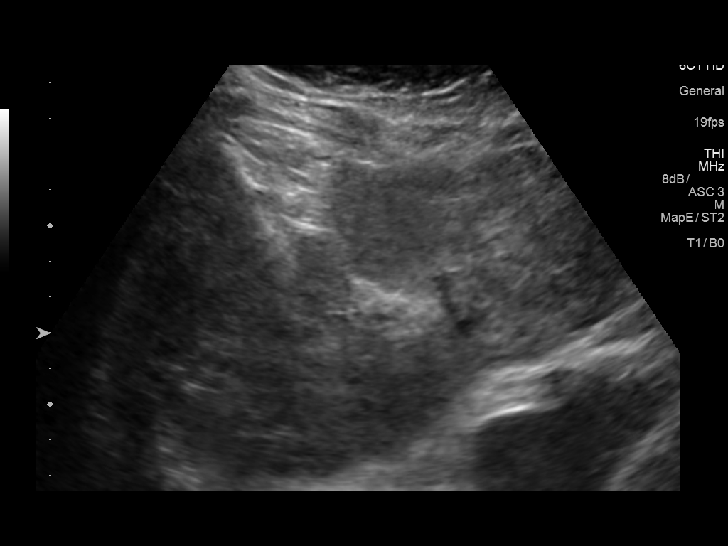
[im 54/59]
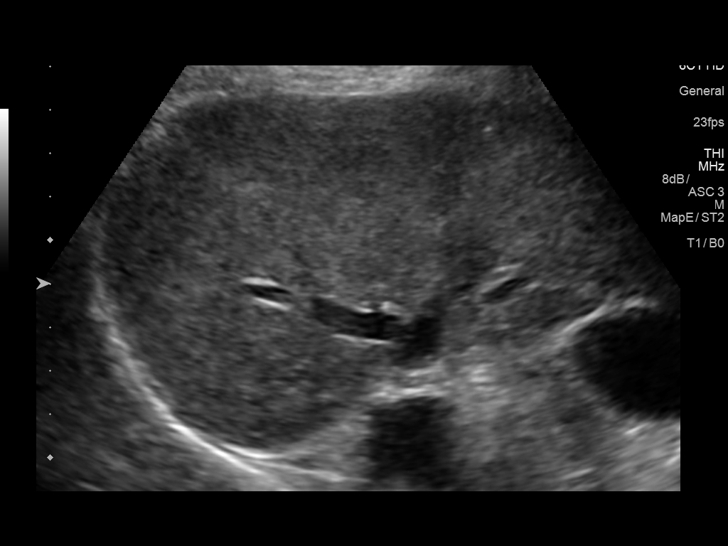
[im 59/59]
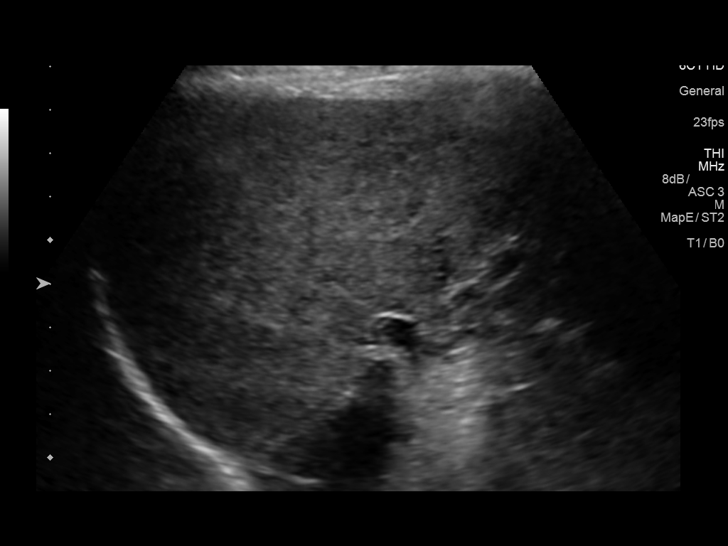

[14 of 25 positions shown; findings below may reference images not displayed]

FINDINGS: Gallbladder:

No gallstones or wall thickening visualized. There is no
pericholecystic fluid. No sonographic Murphy sign noted by
sonographer.

Common bile duct:

Diameter: 4 mm. No intrahepatic or extrahepatic biliary duct
dilatation.

Liver:

No focal lesion identified. Liver echogenicity is inhomogeneous and
overall somewhat increased.. Portal vein is patent on color Doppler
imaging with normal direction of blood flow towards the liver.
IMPRESSION: Liver echogenicity is inhomogeneous and overall somewhat increased.
This appearance is consistent with hepatic steatosis with potential
underlying parenchymal liver disease. While no focal liver lesions
are evident on this study, it must be cautioned that the sensitivity
of ultrasound for detection of focal liver lesions is diminished in
this circumstance.

Study otherwise unremarkable.

## 2019-03-05 DIAGNOSIS — N200 Calculus of kidney: Secondary | ICD-10-CM | POA: Diagnosis not present

## 2019-03-12 ENCOUNTER — Ambulatory Visit
Admission: RE | Admit: 2019-03-12 | Discharge: 2019-03-12 | Disposition: A | Discharge status: Home / Self Care | Payer: Medicare Other | Source: Ambulatory Visit | Attending: Urology | Admitting: Urology

## 2019-03-12 ENCOUNTER — Other Ambulatory Visit: Payer: Self-pay | Admitting: Urology

## 2019-03-12 DIAGNOSIS — N200 Calculus of kidney: Secondary | ICD-10-CM

## 2019-03-12 DIAGNOSIS — Z87442 Personal history of urinary calculi: Secondary | ICD-10-CM | POA: Diagnosis not present

## 2019-03-13 DIAGNOSIS — H2513 Age-related nuclear cataract, bilateral: Secondary | ICD-10-CM | POA: Diagnosis not present

## 2019-03-13 DIAGNOSIS — H401132 Primary open-angle glaucoma, bilateral, moderate stage: Secondary | ICD-10-CM | POA: Diagnosis not present

## 2019-03-13 DIAGNOSIS — Z79899 Other long term (current) drug therapy: Secondary | ICD-10-CM | POA: Diagnosis not present

## 2019-04-07 ENCOUNTER — Other Ambulatory Visit: Payer: Self-pay | Admitting: Family Medicine

## 2019-04-20 ENCOUNTER — Encounter: Payer: Medicare Other | Admitting: Family Medicine

## 2019-04-30 ENCOUNTER — Ambulatory Visit: Payer: Self-pay | Admitting: Rheumatology

## 2019-05-06 IMAGING — CT CT MAXILLOFACIAL W/O CM
3 series · 16 of 47 positions shown, 19 images · non-contrast
Comparison: None.

CLINICAL DATA: Acquired stenosis of left nasolacrimal duct.

EXAM:
CT MAXILLOFACIAL WITHOUT CONTRAST
TECHNIQUE: Multidetector CT imaging of the maxillofacial structures was
performed. Multiplanar CT image reconstructions were also generated.
A small metallic BB was placed on the right temple in order to
reliably differentiate right from left.

[Series 3: facial/ orbits 2.0 h30s · axial · 0.33mm/px · z∈[-158,-10]mm · 10 of 86 slices shown, 13 images]
[im 6/86  brain]
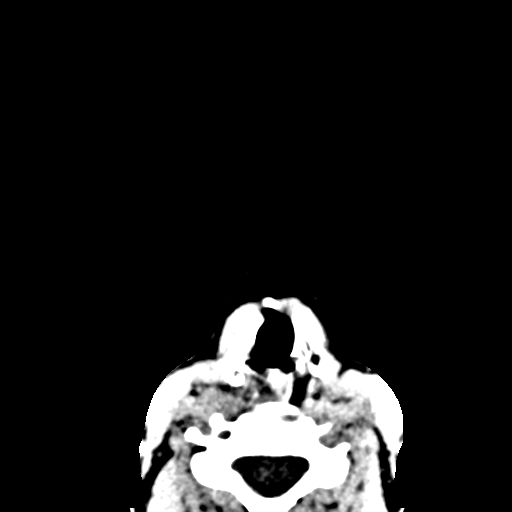
[im 6/86  bone]
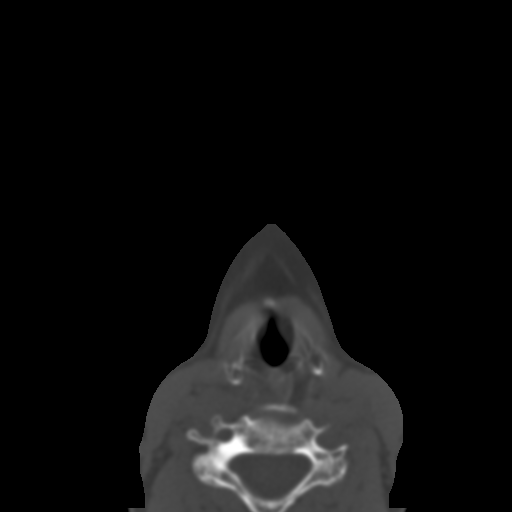
[im 15/86  bone]
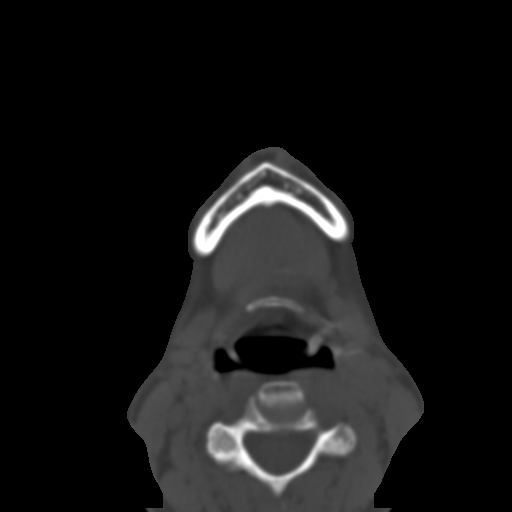
[im 24/86  bone]
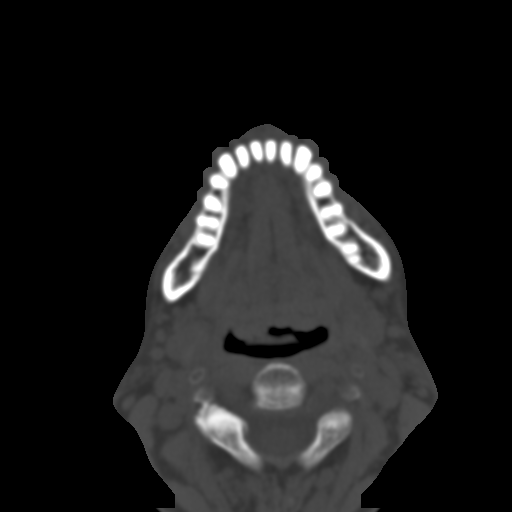
[im 30/86  bone]
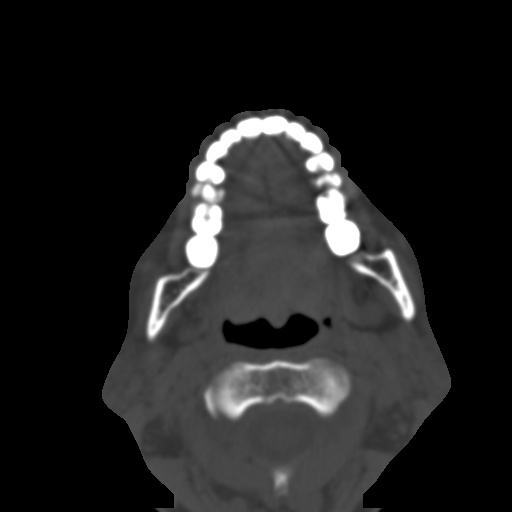
[im 39/86  brain]
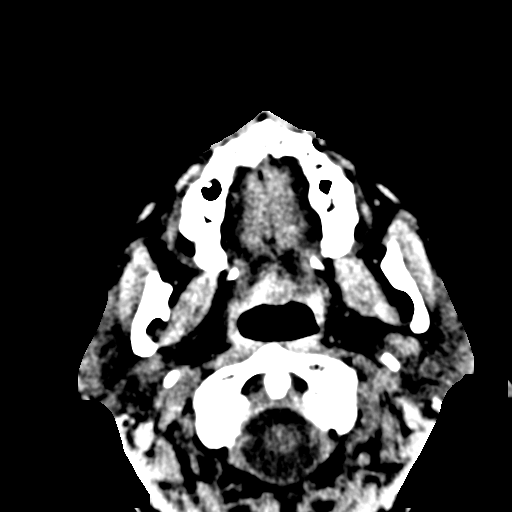
[im 39/86  bone]
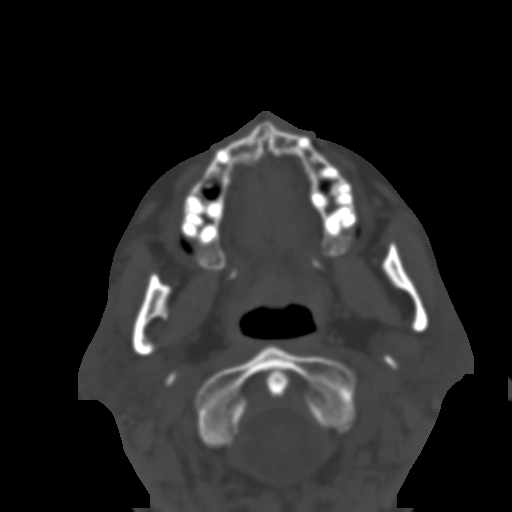
[im 47/86  bone]
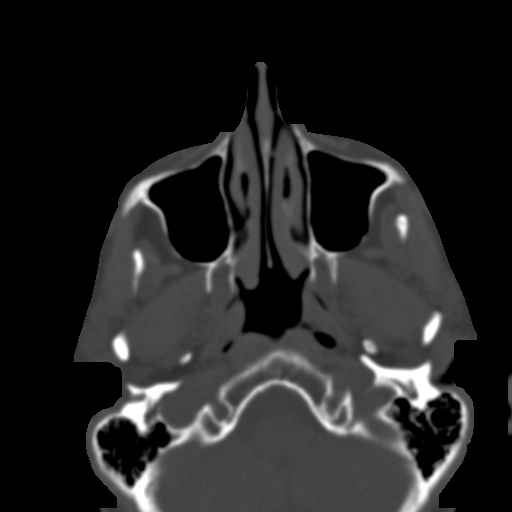
[im 56/86  bone]
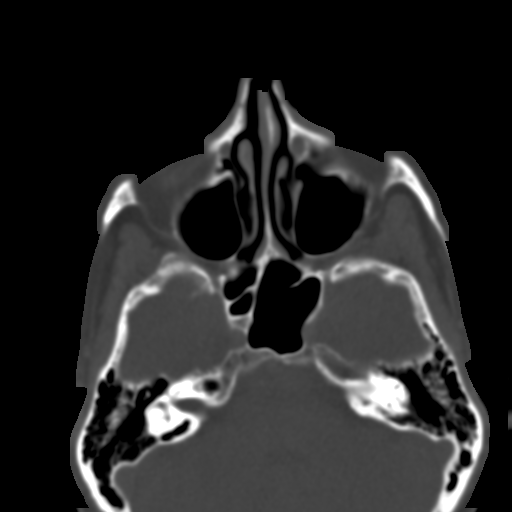
[im 65/86  bone]
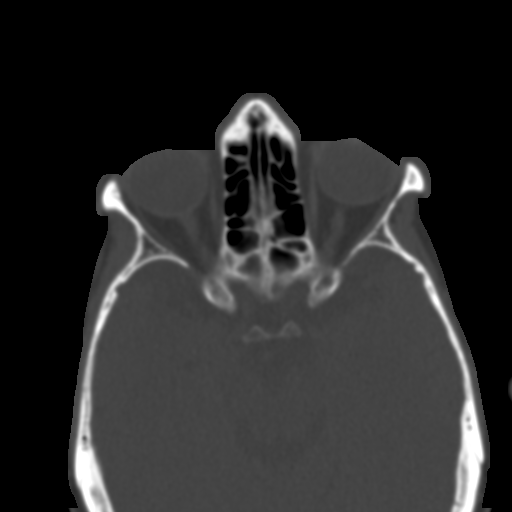
[im 71/86  brain]
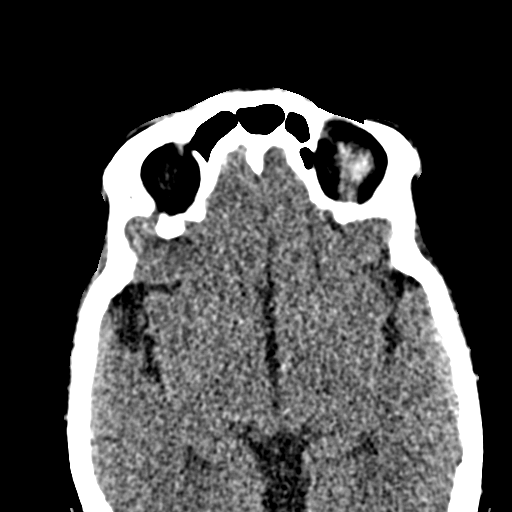
[im 71/86  bone]
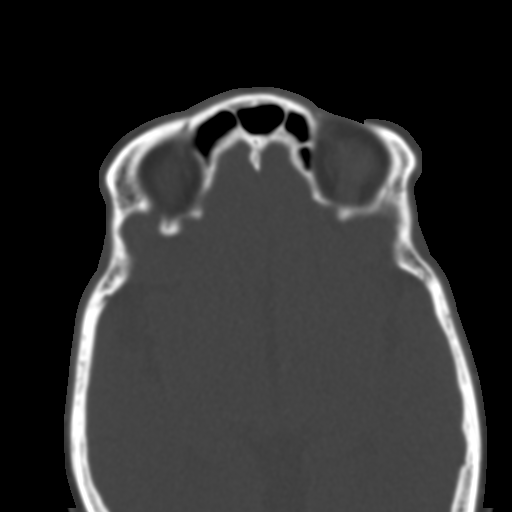
[im 80/86  bone]
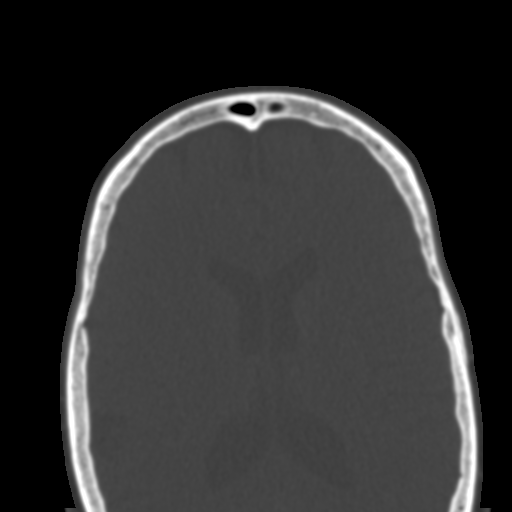

[Series 7: coronal soft tissue · coronal · 0.47mm/px · 3 of 104 slices shown]
[im 35/104  bone]
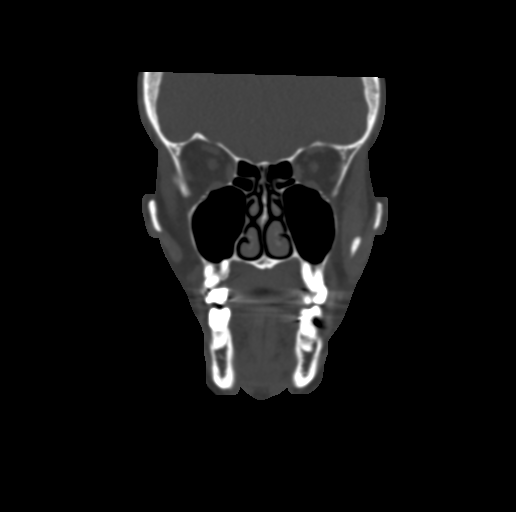
[im 46/104  bone]
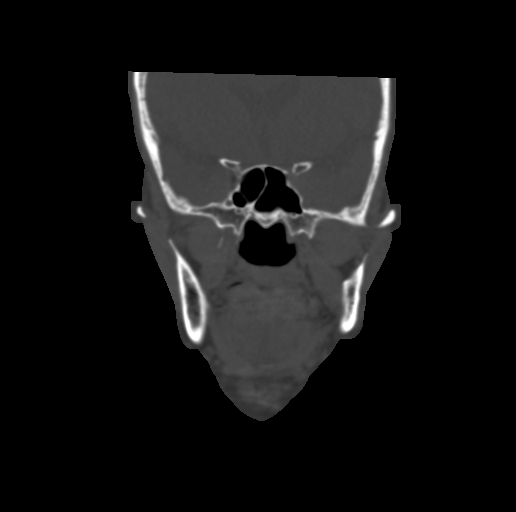
[im 58/104  bone]
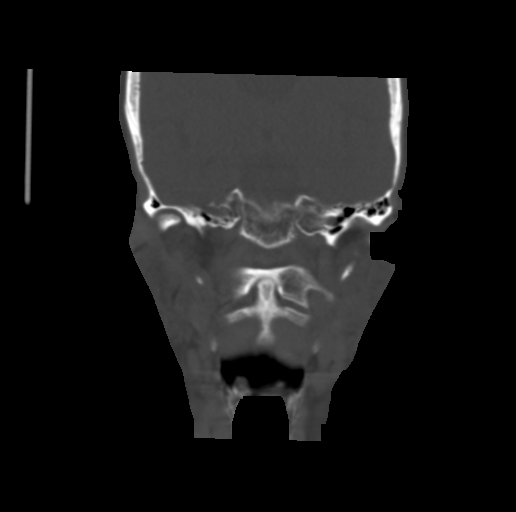

[Series 8: sagittal soft tissue · sagittal · 0.46mm/px · 3 of 76 slices shown]
[im 26/76  bone]
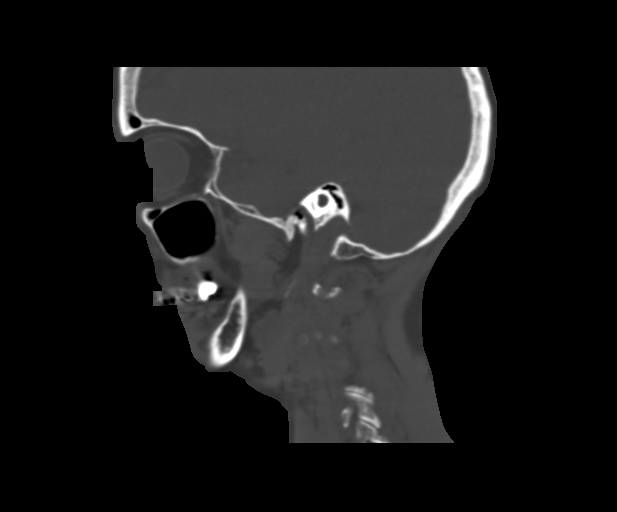
[im 38/76  bone]
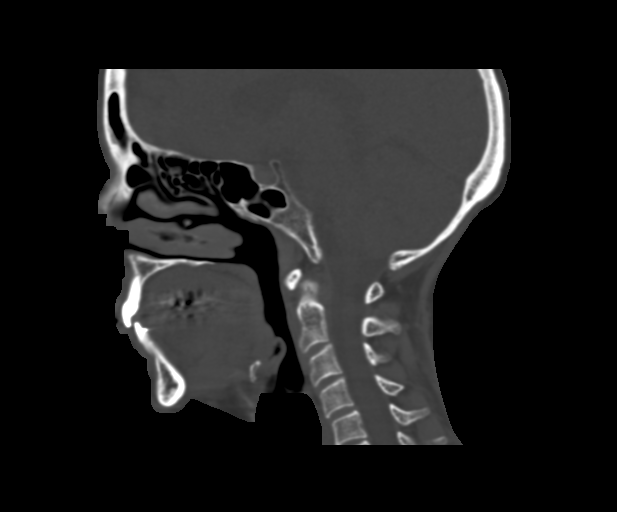
[im 51/76  bone]
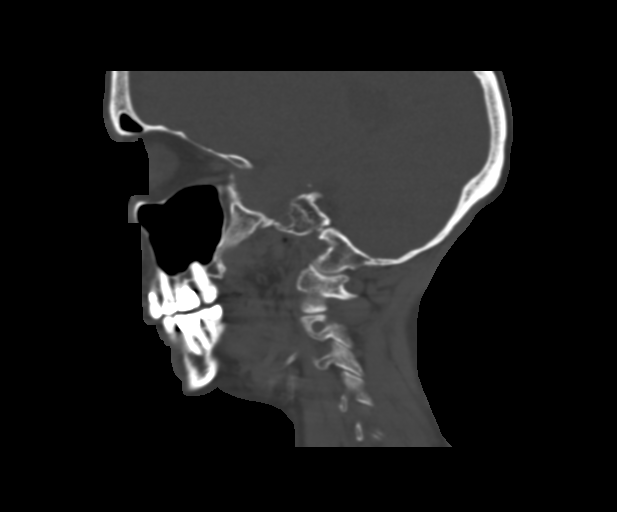

[16 of 47 positions shown; findings below may reference images not displayed]

FINDINGS: Osseous: No fracture or mandibular dislocation. No destructive
process.

Orbits: Negative. No traumatic or inflammatory finding.

Sinuses: Clear.

Soft tissues: There is noted enlargement of the left lacrimal sac
and nasolacrimal duct which appears to be fluid filled.

Limited intracranial: No significant or unexpected finding.
IMPRESSION: Fluid-filled enlarged left lacrimal sac is noted as well as dilated
fluid-filled left nasolacrimal duct, concerning for distal
obstruction. No other abnormality seen in maxillofacial region.

## 2019-05-11 ENCOUNTER — Other Ambulatory Visit: Payer: Medicare Other

## 2019-05-11 ENCOUNTER — Ambulatory Visit: Payer: Medicare Other | Attending: Internal Medicine

## 2019-05-11 DIAGNOSIS — Z20822 Contact with and (suspected) exposure to covid-19: Secondary | ICD-10-CM

## 2019-05-12 LAB — NOVEL CORONAVIRUS, NAA: SARS-CoV-2, NAA: DETECTED — AB

## 2019-05-13 ENCOUNTER — Telehealth: Payer: Self-pay | Admitting: Pulmonary Disease

## 2019-05-13 ENCOUNTER — Other Ambulatory Visit: Payer: Self-pay | Admitting: Pulmonary Disease

## 2019-05-13 DIAGNOSIS — M359 Systemic involvement of connective tissue, unspecified: Secondary | ICD-10-CM

## 2019-05-13 DIAGNOSIS — M3500 Sicca syndrome, unspecified: Secondary | ICD-10-CM

## 2019-05-13 DIAGNOSIS — U071 COVID-19: Secondary | ICD-10-CM

## 2019-05-13 NOTE — Telephone Encounter (Signed)
05/13/19  I connected by phone with Kathleen Mcclain on 05/13/2019 at 9:46 AM to discuss the potential use of an new treatment for mild to moderate COVID-19 viral infection in non-hospitalized patients.  This patient is a 69 y.o. female that meets the FDA criteria for Emergency Use Authorization of bamlanivimab or casirivimab\imdevimab.  Has a (+) direct SARS-CoV-2 viral test result  Has mild or moderate COVID-19   Is ? 69 years of age and weighs ? 40 kg  Is NOT hospitalized due to COVID-19  Is NOT requiring oxygen therapy or requiring an increase in baseline oxygen flow rate due to COVID-19  Is within 10 days of symptom onset  Has at least one of the high risk factor(s) for progression to severe COVID-19 and/or hospitalization as defined in EUA.  Specific high risk criteria : >/= 67 yo  Patient reports his symptoms started on 05/09/19.  Symptoms are sinus headache, fatigue.  Patient was tested on 05/11/2019 and was positive for SARS-CoV-2.  Patient's grandson was likely exposure as he also has SARS-CoV-2.  Patient's daughter who she currently lives with tested negative.  Patient is also reporting that she has an autoimmune disorder called Sjogren's syndrome.  I have spoken and communicated the following to the patient or parent/caregiver:  1. FDA has authorized the emergency use of bamlanivimab and casirivimab\imdevimab for the treatment of mild to moderate COVID-19 in adults and pediatric patients with positive results of direct SARS-CoV-2 viral testing who are 62 years of age and older weighing at least 40 kg, and who are at high risk for progressing to severe COVID-19 and/or hospitalization.  2. The significant known and potential risks and benefits of bamlanivimab and casirivimab\imdevimab, and the extent to which such potential risks and benefits are unknown.  3. Information on available alternative treatments and the risks and benefits of those alternatives, including clinical  trials.  4. Patients treated with bamlanivimab and casirivimab\imdevimab should continue to self-isolate and use infection control measures (e.g., wear mask, isolate, social distance, avoid sharing personal items, clean and disinfect "high touch" surfaces, and frequent handwashing) according to CDC guidelines.   5. The patient or parent/caregiver has the option to accept or refuse bamlanivimab or casirivimab\imdevimab .  After reviewing this information with the patient, The patient agreed to proceed with receiving the bamlanimivab infusion and will be provided a copy of the Fact sheet prior to receiving the infusion..  Patient has been scheduled for 05/15/2019 at 8:30 AM  Lauraine Rinne 05/13/2019 9:46 AM

## 2019-05-13 NOTE — Progress Notes (Signed)
05/13/19  I connected by phone with Kathleen Mcclain on 05/13/2019 at 9:46 AM to discuss the potential use of an new treatment for mild to moderate COVID-19 viral infection in non-hospitalized patients.  This patient is a 69 y.o. female that meets the FDA criteria for Emergency Use Authorization of bamlanivimab or casirivimab\imdevimab.  Has a (+) direct SARS-CoV-2 viral test result  Has mild or moderate COVID-19   Is ? 69 years of age and weighs ? 40 kg  Is NOT hospitalized due to COVID-19  Is NOT requiring oxygen therapy or requiring an increase in baseline oxygen flow rate due to COVID-19  Is within 10 days of symptom onset  Has at least one of the high risk factor(s) for progression to severe COVID-19 and/or hospitalization as defined in EUA.  Specific high risk criteria : >/= 67 yo  Patient reports his symptoms started on 05/09/19.  Symptoms are sinus headache, fatigue.  Patient was tested on 05/11/2019 and was positive for SARS-CoV-2.  Patient's grandson was likely exposure as he also has SARS-CoV-2.  Patient's daughter who she currently lives with tested negative.  Patient is also reporting that she has an autoimmune disorder called Sjogren's syndrome.  I have spoken and communicated the following to the patient or parent/caregiver:  1. FDA has authorized the emergency use of bamlanivimab and casirivimab\imdevimab for the treatment of mild to moderate COVID-19 in adults and pediatric patients with positive results of direct SARS-CoV-2 viral testing who are 82 years of age and older weighing at least 40 kg, and who are at high risk for progressing to severe COVID-19 and/or hospitalization.  2. The significant known and potential risks and benefits of bamlanivimab and casirivimab\imdevimab, and the extent to which such potential risks and benefits are unknown.  3. Information on available alternative treatments and the risks and benefits of those alternatives, including clinical  trials.  4. Patients treated with bamlanivimab and casirivimab\imdevimab should continue to self-isolate and use infection control measures (e.g., wear mask, isolate, social distance, avoid sharing personal items, clean and disinfect "high touch" surfaces, and frequent handwashing) according to CDC guidelines.   5. The patient or parent/caregiver has the option to accept or refuse bamlanivimab or casirivimab\imdevimab .  After reviewing this information with the patient, The patient agreed to proceed with receiving the bamlanimivab infusion and will be provided a copy of the Fact sheet prior to receiving the infusion..  Patient has been scheduled for 05/15/2019 at 8:30 AM  Lauraine Rinne 05/13/2019 9:46 AM

## 2019-05-15 ENCOUNTER — Ambulatory Visit (HOSPITAL_COMMUNITY)
Admission: RE | Admit: 2019-05-15 | Discharge: 2019-05-15 | Disposition: A | Discharge status: Home / Self Care | Payer: Medicare Other | Source: Ambulatory Visit | Attending: Pulmonary Disease | Admitting: Pulmonary Disease

## 2019-05-15 DIAGNOSIS — U071 COVID-19: Secondary | ICD-10-CM | POA: Diagnosis not present

## 2019-05-15 DIAGNOSIS — Z23 Encounter for immunization: Secondary | ICD-10-CM | POA: Diagnosis not present

## 2019-05-15 MED ORDER — DIPHENHYDRAMINE HCL 50 MG/ML IJ SOLN: 50.0000 mg | Freq: Once | INTRAMUSCULAR | Status: DC | PRN

## 2019-05-15 MED ORDER — EPINEPHRINE 0.3 MG/0.3ML IJ SOAJ: 0.3000 mg | Freq: Once | INTRAMUSCULAR | Status: DC | PRN

## 2019-05-15 MED ORDER — SODIUM CHLORIDE 0.9 % IV SOLN
700.0000 mg | Freq: Once | INTRAVENOUS | Status: AC
  Administered 2019-05-15: 09:00:00 700 mg via INTRAVENOUS
  Filled 2019-05-15: qty 20

## 2019-05-15 MED ORDER — METHYLPREDNISOLONE SODIUM SUCC 125 MG IJ SOLR: 125.0000 mg | Freq: Once | INTRAMUSCULAR | Status: DC | PRN

## 2019-05-15 MED ORDER — FAMOTIDINE IN NACL 20-0.9 MG/50ML-% IV SOLN: 20.0000 mg | Freq: Once | INTRAVENOUS | Status: DC | PRN

## 2019-05-15 MED ORDER — SODIUM CHLORIDE 0.9 % IV SOLN
INTRAVENOUS | Status: DC | PRN
  Administered 2019-05-15: 250 mL via INTRAVENOUS

## 2019-05-15 MED ORDER — ALBUTEROL SULFATE HFA 108 (90 BASE) MCG/ACT IN AERS: 2.0000 | INHALATION_SPRAY | Freq: Once | RESPIRATORY_TRACT | Status: DC | PRN

## 2019-05-15 NOTE — Discharge Instructions (Signed)

## 2019-05-15 NOTE — Progress Notes (Signed)
  Diagnosis: COVID-19  Physician: Dr. Joya Gaskins  Procedure: [facility] Med: bamlanivimab infusion - Provided patient with bamlanimivab fact sheet for patients, parents and caregivers prior to infusion.  Complications: No immediate complications noted.  Discharge: Discharged home   Tia Masker 05/15/2019

## 2019-06-24 NOTE — Progress Notes (Signed)
Office Visit Note  Patient: Kathleen Mcclain             Date of Birth: 10/27/1950           MRN: [id number]             PCP: Alycia Rossetti, MD Referring: Alycia Rossetti, MD Visit Date: 07/01/2019 Occupation: @GUAROCC @  Subjective:  Sicca symptoms   History of Present Illness: HIBAH GIACONA is a 69 y.o. female with history of sicca syndrome and positive ANA.  She presents today with ongoing sicca symptoms.  She uses refresh and eyedrop eyedrops as well as over-the-counter products for symptomatic relief.  She continues to see her dentist every 6 months and has not had any recent dental caries.  She denies any sores in her mouth or nose.  She has not had any recent rashes but states that she does experience scalp itchiness.  She denies any increased hair loss recently.  She has not had any symptoms of Raynaud's.  She denies any enlarged lymph nodes at this time.  She denies any chest pain or shortness of breath.  She has been experiencing increased pain in both hands.  She states she has the discomfort and joint swelling in the right second MCP joint.  She denies any other joint pain or joint swelling at this time   Activities of Daily Living:  Patient reports morning stiffness for 0 none.   Patient Denies nocturnal pain.  Difficulty dressing/grooming: Denies Difficulty climbing stairs: Denies Difficulty getting out of chair: Denies Difficulty using hands for taps, buttons, cutlery, and/or writing: Reports  Review of Systems  Constitutional: Negative for fatigue.  HENT: Positive for mouth dryness. Negative for mouth sores and nose dryness.   Eyes: Positive for dryness. Negative for pain and visual disturbance.  Respiratory: Negative for cough, hemoptysis, shortness of breath and difficulty breathing.   Cardiovascular: Negative for chest pain, palpitations, hypertension and swelling in legs/feet.  Gastrointestinal: Negative for blood in stool, constipation and diarrhea.    Endocrine: Negative for increased urination.  Genitourinary: Negative for difficulty urinating and painful urination.  Musculoskeletal: Positive for arthralgias and joint pain. Negative for joint swelling, myalgias, muscle weakness, morning stiffness, muscle tenderness and myalgias.  Skin: Negative for color change, pallor, rash, hair loss, nodules/bumps, skin tightness, ulcers and sensitivity to sunlight.  Allergic/Immunologic: Negative for susceptible to infections.  Neurological: Negative for dizziness, numbness, headaches and weakness.  Hematological: Negative for bruising/bleeding tendency and swollen glands.  Psychiatric/Behavioral: Negative for depressed mood and sleep disturbance. The patient is not nervous/anxious.     PMFS History:  Patient Active Problem List   Diagnosis Date Noted  . Hepatic steatosis 04/02/2018  . Elevated liver function tests 04/02/2018  . Autoimmune disease ([place]) 04/02/2018  . Essential hypertension 03/21/2017  . Hypothyroidism 03/09/2015  . Hyperglycemia 03/09/2015  . Allergy   . Cataract   . Glaucoma   . Hypercholesteremia   . Personal history of kidney stones     Past Medical History:  Diagnosis Date  . Allergy   . Arthritis   . 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 Cataract   . Glaucoma   . History of kidney stones   . Hypercholesteremia   . Hypothyroid   . Hypothyroidism   . Personal history of kidney stones   . Pre-diabetes     Family History  Problem Relation Age of Onset  . Alzheimer's disease Mother   . Cancer  Father        Pancreatic Cancer  . Hyperlipidemia Father   . Arthritis Maternal Grandmother   . Diabetes Maternal Grandfather   . Stroke Maternal Grandfather   . Cancer Paternal Grandmother   . Cancer Paternal Grandfather   . Thyroid disease Sister   . Celiac disease Sister   . Thyroid disease Brother   . Anxiety disorder Son   . Healthy Son   . Healthy Daughter   . Healthy Daughter    Past Surgical  History:  Procedure Laterality Date  . CERVICAL BIOPSY  12/07   pre cancerous cells  . COLONOSCOPY    . DACRORHINOCYSTOTOMY Left 08/31/2017   Procedure: DACROCYSTORHINOSTOMY (DCR);  Surgeon: Clista Bernhardt, MD;  Location: [facility];  Service: Ophthalmology;  Laterality: Left;  . ETHMOIDECTOMY Left 08/31/2017   Procedure: ETHMOIDECTOMY, anterior;  Surgeon: Clista Bernhardt, MD;  Location: [facility];  Service: Ophthalmology;  Laterality: Left;  . EYE SURGERY Left    tear duct surgery, x4  . LIPOMA EXCISION     on back   . TEAR DUCT PROBING Left 02/20/2017   Procedure: nasolacrimal duct obstruct dacryocystorhinostomy left eye, anterior ephmoidectomy left eye, probe with stent left eye;  Surgeon: Clista Bernhardt, MD;  Location: [facility];  Service: Ophthalmology;  Laterality: Left;  . TONSILLECTOMY    . TUBAL LIGATION  5/86   Social History   Social History Narrative   Does not work outside of home.    Has 2 sets of grandchildren--1 set lives in this area. 1 set lives at [place] [place].   Approximately 2013-- Was told "PreDiabetic"---She lost 30 pounds. Very careful with diet now. Does not exercise.   Immunization History  Administered Date(s) Administered  . Fluad Quad(high Dose 65+) 01/20/2019  . Influenza,inj,Quad PF,6+ Mos 03/09/2015, 03/05/2016, 01/30/2017, 01/02/2018  . Influenza-Unspecified 01/23/2012, 01/29/2013, 02/01/2014, 03/09/2015, 02/22/2016, 01/21/2017  . Pneumococcal Conjugate-13 04/02/2018  . Td 04/23/2005  . Tdap 01/29/2013  . Zoster 03/09/2015     Objective: Vital Signs: BP (!) 145/84 (BP Location: Right Arm, Patient Position: Sitting, Cuff Size: Normal)   Pulse 84   Resp 14   Ht 5\' 4"  (1.626 m)   Wt 129 lb 9.6 oz (58.8 kg)   BMI 22.25 kg/m    Physical Exam Vitals and nursing note reviewed.  Constitutional:      Appearance: She is well-developed.  HENT:     Head: Normocephalic and atraumatic.  Eyes:     Conjunctiva/sclera: Conjunctivae normal.  Pulmonary:      Effort: Pulmonary effort is normal.  Abdominal:     General: Bowel sounds are normal.     Palpations: Abdomen is soft.  Musculoskeletal:     Cervical back: Normal range of motion.  Lymphadenopathy:     Cervical: No cervical adenopathy.  Skin:    General: Skin is warm and dry.     Capillary Refill: Capillary refill takes less than 2 seconds.  Neurological:     Mental Status: She is alert and oriented to person, place, and time.  Psychiatric:        Behavior: Behavior normal.      Musculoskeletal Exam: C-spine, thoracic spine, lumbar spine good range of motion.  Shoulder joints, elbow joints, wrist joints, MCPs, PIPs and DIPs good range of motion no synovitis.  She has tenderness of the right second MCP joint.  She has mild IP and DIP thickening consistent with osteoarthritis of both hands.  Hip joints have good range  of motion with no discomfort.  Knee joints have good range of motion no with warmth or effusion.  Ankle joints have good range of motion no tenderness or synovitis.  No tenderness over trochanteric bursa bilaterally.  CDAI Exam: CDAI Score: -- Patient Global: --; Provider Global: -- Swollen: --; Tender: -- Joint Exam 07/01/2019   No joint exam has been documented for this visit   There is currently no information documented on the homunculus. Go to the Rheumatology activity and complete the homunculus joint exam.  Investigation: No additional findings.  Imaging: XR Hand 2 View Left  Result Date: 07/01/2019 Mild PIP and DIP narrowing was noted.  No MCP, intercarpal radiocarpal joint space narrowing was noted.  CMC narrowing was noted.  No erosive changes were noted. Impression: These findings are consistent with osteoarthritis of the hand.  XR Hand 2 View Right  Result Date: 07/01/2019 Mild PIP and DIP narrowing was noted.  No MCP, intercarpal radiocarpal joint space narrowing was noted.  CMC narrowing was noted.  No erosive changes were noted. Impression: These  findings are consistent with osteoarthritis of the hand.   Recent Labs: Lab Results  Component Value Date   WBC 5.7 10/14/2018   HGB 14.0 10/14/2018   PLT 184 10/14/2018   NA 143 10/14/2018   K 5.2 10/14/2018   CL 107 10/14/2018   CO2 28 10/14/2018   GLUCOSE 100 (H) 10/14/2018   BUN 12 10/14/2018   CREATININE 0.85 10/14/2018   BILITOT 1.9 (H) 10/14/2018   ALKPHOS 79 09/19/2016   AST 24 10/14/2018   ALT 17 10/14/2018   PROT 6.8 10/14/2018   ALBUMIN 4.3 09/19/2016   CALCIUM 10.1 10/14/2018   GFRAA 88 11/28/2017    Speciality Comments: PLQ eye exam: 03/13/2019 baseline WNL. Follow up in 1 year. (starting PLQ per Dr. Edilia Bo).  Procedures:  No procedures performed Allergies: Penicillins and Alphagan [brimonidine]   Assessment / Plan:     Visit Diagnoses: Sicca syndrome ([place]) - Positive ANA, positive double-stranded DNA, AVISE index -2.6: She continues to have chronic sicca symptoms.  She uses eyedrops and refresh eyedrops as well as over-the-counter products for mouth dryness.  She sees a dentist every 6 months and has not had any recent dental caries.  She was encouraged to continue to use eyedrops and over-the-counter products for symptomatic relief.  She will notify us if she develops any new or worsening symptoms.  She will follow-up in the office in 1 year.  We will be obtaining repeat AVISE labs for monitoring purposes.  Positive ANA (antinuclear antibody): ANA 1: 640 homogeneous, double-stranded DNA 649, positive anti carbamylated IgG and positive antithyroid peroxidase IgG, hx of fatigue, sicca symptoms and hair loss: AVISE index was -2.6 on 11/29/2017.  She has not developed any new or worsening symptoms consistent with autoimmune disease.  She is not taking any immunosuppressive agents at this time.  She continues to have chronic sicca symptoms but uses the eyedrops, refresh, and over-the-counter products for symptomatic relief.  She sees a dentist every 6 months and has not had  any recent dental caries.  She is not had any oral nasal ulcerations.  No symptoms of Raynaud's recently.  No digit ulcerations or signs of gangrene noted.  She has not had any enlarged lymph nodes or worsening fatigue recently.  She has not had any recent rashes.  No Maller rash was noted on exam.  She was advised to notify us if she develops any new or worsening symptoms.  She will be obtaining repeat AVISE labs for monitoring.  Positive double stranded DNA antibody test: dsDNA 649 on 11/29/17: We will repeat AVISE labs.  Pain in both hands - She has been experienced increased pain in both hands.  She has mild PIP and DIP thickening consistent with of osteoarthritis of both hands.  She has tenderness of the right second MCP joint but no synovitis was noted.  X-rays of both hands were obtained today which were consistent with osteoarthritis bilaterally.  We discussed the importance of joint protection and muscle strengthening.  She we discussed performing hand exercises on a regular basis.  Plan: XR Hand 2 View Right, XR Hand 2 View Left  Primary osteoarthritis of both hands: X-rays were obtained today which were consistent with osteoarthritis of both hands.  Joint protection and muscle strengthening were discussed  Elevated LFTs - History of mildly elevated LFTs most likely due to statin use.  LFTs were within normal limits on 10/14/2018.  Other medical conditions are listed as follows:  Hypercholesteremia  Essential hypertension  Hypothyroidism, unspecified type  Personal history of kidney stones: She avoids taking a calcium supplement due to her history of kidney stones.  Orders: Orders Placed This Encounter  Procedures  . XR Hand 2 View Right  . XR Hand 2 View Left   No orders of the defined types were placed in this encounter.   Face-to-face time spent with patient was 30 minutes. Greater than 50% of time was spent in counseling and coordination of care.  Follow-Up Instructions:  Return in about 1 year (around 06/30/2020) for Positive ANA, Sicca.   Ofilia Neas, PA-C   I examined and evaluated the patient with Hazel Sams PA.  Patient has been experiencing some tenderness over left second MCP joint.  We will obtain labs.  I also discussed obtaining an ultrasound to evaluate for synovitis.  Patient stated that she will contact me.  The plan of care was discussed as noted above.  Bo Merino, MD  Note - This record has been created using Editor, commissioning.  Chart creation errors have been sought, but may not always  have been located. Such creation errors do not reflect on  the standard of medical care.

## 2019-07-01 ENCOUNTER — Ambulatory Visit: Payer: Self-pay

## 2019-07-01 ENCOUNTER — Ambulatory Visit (INDEPENDENT_AMBULATORY_CARE_PROVIDER_SITE_OTHER): Payer: Medicare Other | Admitting: Rheumatology

## 2019-07-01 ENCOUNTER — Encounter: Payer: Self-pay | Admitting: Rheumatology

## 2019-07-01 ENCOUNTER — Other Ambulatory Visit: Payer: Self-pay

## 2019-07-01 VITALS — BP 145/84 | HR 84 | Resp 14 | Ht 64.0 in | Wt 129.6 lb

## 2019-07-01 DIAGNOSIS — R768 Other specified abnormal immunological findings in serum: Secondary | ICD-10-CM | POA: Diagnosis not present

## 2019-07-01 DIAGNOSIS — M19042 Primary osteoarthritis, left hand: Secondary | ICD-10-CM

## 2019-07-01 DIAGNOSIS — Z87442 Personal history of urinary calculi: Secondary | ICD-10-CM | POA: Diagnosis not present

## 2019-07-01 DIAGNOSIS — E039 Hypothyroidism, unspecified: Secondary | ICD-10-CM

## 2019-07-01 DIAGNOSIS — M79642 Pain in left hand: Secondary | ICD-10-CM

## 2019-07-01 DIAGNOSIS — I10 Essential (primary) hypertension: Secondary | ICD-10-CM | POA: Diagnosis not present

## 2019-07-01 DIAGNOSIS — E7800 Pure hypercholesterolemia, unspecified: Secondary | ICD-10-CM

## 2019-07-01 DIAGNOSIS — R7989 Other specified abnormal findings of blood chemistry: Secondary | ICD-10-CM

## 2019-07-01 DIAGNOSIS — M3500 Sicca syndrome, unspecified: Secondary | ICD-10-CM

## 2019-07-01 DIAGNOSIS — M79641 Pain in right hand: Secondary | ICD-10-CM

## 2019-07-01 DIAGNOSIS — M19041 Primary osteoarthritis, right hand: Secondary | ICD-10-CM

## 2019-07-02 ENCOUNTER — Encounter: Payer: Self-pay | Admitting: Rheumatology

## 2019-07-02 DIAGNOSIS — D8989 Other specified disorders involving the immune mechanism, not elsewhere classified: Secondary | ICD-10-CM | POA: Diagnosis not present

## 2019-07-02 DIAGNOSIS — M3500 Sicca syndrome, unspecified: Secondary | ICD-10-CM | POA: Diagnosis not present

## 2019-07-02 DIAGNOSIS — R768 Other specified abnormal immunological findings in serum: Secondary | ICD-10-CM | POA: Diagnosis not present

## 2019-07-03 ENCOUNTER — Other Ambulatory Visit: Payer: Self-pay | Admitting: Family Medicine

## 2019-07-15 ENCOUNTER — Ambulatory Visit (INDEPENDENT_AMBULATORY_CARE_PROVIDER_SITE_OTHER): Payer: Medicare Other | Admitting: Family Medicine

## 2019-07-15 ENCOUNTER — Other Ambulatory Visit: Payer: Self-pay

## 2019-07-15 ENCOUNTER — Encounter: Payer: Self-pay | Admitting: Family Medicine

## 2019-07-15 VITALS — BP 144/86 | HR 94 | Temp 98.7°F | Resp 16 | Ht 64.0 in | Wt 129.0 lb

## 2019-07-15 DIAGNOSIS — M359 Systemic involvement of connective tissue, unspecified: Secondary | ICD-10-CM | POA: Diagnosis not present

## 2019-07-15 DIAGNOSIS — E039 Hypothyroidism, unspecified: Secondary | ICD-10-CM | POA: Diagnosis not present

## 2019-07-15 DIAGNOSIS — I10 Essential (primary) hypertension: Secondary | ICD-10-CM | POA: Diagnosis not present

## 2019-07-15 DIAGNOSIS — E7800 Pure hypercholesterolemia, unspecified: Secondary | ICD-10-CM

## 2019-07-15 DIAGNOSIS — Z23 Encounter for immunization: Secondary | ICD-10-CM

## 2019-07-15 DIAGNOSIS — Z0001 Encounter for general adult medical examination with abnormal findings: Secondary | ICD-10-CM | POA: Diagnosis not present

## 2019-07-15 DIAGNOSIS — Z0000 Encounter for general adult medical examination without abnormal findings: Secondary | ICD-10-CM

## 2019-07-15 MED ORDER — LISINOPRIL 5 MG PO TABS: 5.0000 mg | ORAL_TABLET | Freq: Every day | ORAL | 3 refills | Status: DC

## 2019-07-15 NOTE — Progress Notes (Signed)
Subjective:   Patient presents for Medicare Annual/Subsequent preventive examination.     She is followed by [facility] - scheduled for mammogram in April    Ophthalmologist Dr. Edilia Bo treated for glaucoma,   [facility] to kidney stones, no recent stones or events   Recently seen by rheumatology - had xrays done of hands, lupus labs sent off, awaiting results before any medication additions   Hypocholesteremia-she is on her Lipitor without any adverse reactions, father had high cholesterol   Hypothyroidism-been on thyroid replacement for greater than 5 years, TSH was at goal 6 months ago , 75MCG M,W,F 25mcg all other days  No thyroid surgery  Many family members have thyroid disorder   Taking Vitamin D/ MVI/ Zinc   Review Past Medical/Family/Social: Per EMR    Risk Factors  Current exercise habits: No exercise program but she is active Dietary issues discussed: She admits she has been eating more sweets  Cardiac risk factors: Hyperlipidemia history of prediabetes hypertension  Depression Screen  (Note: if answer to either of the following is "Yes", a more complete depression screening is indicated)  Over the past two weeks, have you felt down, depressed or hopeless? No Over the past two weeks, have you felt little interest or pleasure in doing things? No Have you lost interest or pleasure in daily life? No Do you often feel hopeless? No Do you cry easily over simple problems? No   Activities of Daily Living  In your present state of health, do you have any difficulty performing the following activities?:  Driving? No  Managing money? No  Feeding yourself? No  Getting from bed to chair? No  Climbing a flight of stairs? No  Preparing food and eating?: No  Bathing or showering? No  Getting dressed: No  Getting to the toilet? No  Using the toilet:No  Moving around from place to place: No  In the past year have you fallen or had a  near fall?:No  Are you sexually active? No  Do you have more than one partner? No   Hearing Difficulties: No  Do you often ask people to speak up or repeat themselves? No  Do you experience ringing or noises in your ears? No Do you have difficulty understanding soft or whispered voices? No  Do you feel that you have a problem with memory? No Do you often misplace items? No  Do you feel safe at home? Yes  Cognitive Testing  Alert? Yes Normal Appearance?Yes  Oriented to person? Yes Place? Yes  Time? Yes  Recall of three objects? Yes  Can perform simple calculations? Yes  Displays appropriate judgment?Yes  Can read the correct time from a watch face?Yes   List the Names of Other Physician/Practitioners you currently use:  Rheumatology- Dr. Bronson Curb Dr. Edilia Bo [facility]      Screening Tests / Date Colonoscopy      UTD               Zostavax  UTD  Mammogram  Due Bone Density- Due  Influenza Vaccine  UTD Tetanus/tdap  UTD Pneumonia - due for PNA 23  ROS: GEN- denies fatigue, fever, weight loss,weakness, recent illness HEENT- denies eye drainage, change in vision, nasal discharge, CVS- denies chest pain, palpitations RESP- denies SOB, cough, wheeze ABD- denies N/V, change in stools, abd pain GU- denies dysuria, hematuria, dribbling, incontinence MSK- denies joint pain, muscle aches, injury Neuro- denies headache, dizziness, syncope, seizure activity  Physical:  vitals reviewed  GEN- NAD, alert and oriented x3 HEENT- PERRL, EOMI, non injected sclera, pink conjunctiva, MMM, oropharynx clear , TM clear no effusion Neck- Supple, no thryomegaly, no carotid bruit  CVS- RRR, no murmur RESP-CTAB ABD-NABS,soft,NT,ND  Psych- normal affect and mood  EXT- No edema Pulses- Radial, DP- 2+    Assessment:    Annual wellness medicare exam   Plan:    During the course of the visit the patient was educated and counseled about appropriate screening and  preventive services including:    Fall/CAGE/depression screen negative  Patient scheduled see her GYN in a few weeks.  Advised her to call and see if they can add bone density to her mammogram appointment  Immunizations  she will be given Pneumovax 23 in the office.  We then discussed getting Covid vaccination.  She does want a get the Shingrix vaccine.  Recommend she get Covid vaccination next as she does not have the schedule and still wants to talk to her family.  She can then wait until after she has been completely vaccinated before she gets the Shingrix and she already has some protection from Zostavax.  Hypertension blood pressure continues to rise.  We will start lisinopril 5 mg once a day.  She is going to check her blood pressure at home will send [place] readings in 2 to 3 weeks.  Hyperlipidemia continue statin drug.  We will recheck lipid panel.  Hypothyroidism continue current supplementation  I will obtain last note from [facility] and labs from her rheumatologist  Continue f/u with specialist   Handout given on advanced directives    Diet review for nutrition referral? Yes ____ Not Indicated __x__  Patient Instructions (the written plan) was given to the patient.  Medicare Attestation  I have personally reviewed:  The patient's medical and social history  Their use of alcohol, tobacco or illicit drugs  Their current medications and supplements  The patient's functional ability including ADLs,fall risks, home safety risks, cognitive, and hearing and visual impairment  Diet and physical activities  Evidence for depression or mood disorders  The patient's weight, height, BMI, and visual acuity have been recorded in the chart. I have made referrals, counseling, and provided education to the patient based on review of the above and I have provided the patient with a written personalized care plan for preventive services.

## 2019-07-15 NOTE — Assessment & Plan Note (Signed)
Lisinopril 5mg  once a day

## 2019-07-15 NOTE — Patient Instructions (Addendum)
COVID Vaccination Information As of right now, we will not be giving COVID-19 vaccines here in our office. It is too many storage and administrating regulations that our office is not equipped to provide at this time.    You can go online at [email or web address]   That website will give you information on all counties.    If not here are the numbers you can call. [place] - [place] or [place] [phone] opt.2 [facility] Department [phone]   You can also find information on [email or web address] or call the state's COVID-19 information phone number at 211.   You can also find information in regards to local pharmacies at: [email or web address]  Schedule Bone Density with your GYN   Pneumovax 23 given today   Lisinopril 5mg  once a day - check blood pressure at home, send me readings in 2-3 weeks   F/U 6 months

## 2019-07-16 LAB — CBC WITH DIFFERENTIAL/PLATELET
Absolute Monocytes: 532 cells/uL (ref 200–950)
Basophils Absolute: 49 cells/uL (ref 0–200)
Basophils Relative: 0.7 %
Eosinophils Absolute: 224 cells/uL (ref 15–500)
Eosinophils Relative: 3.2 %
HCT: 45.5 % — ABNORMAL HIGH (ref 35.0–45.0)
Hemoglobin: 14.6 g/dL (ref 11.7–15.5)
Lymphs Abs: 1400 cells/uL (ref 850–3900)
MCH: 29.2 pg (ref 27.0–33.0)
MCHC: 32.1 g/dL (ref 32.0–36.0)
MCV: 91 fL (ref 80.0–100.0)
MPV: 12.1 fL (ref 7.5–12.5)
Monocytes Relative: 7.6 %
Neutro Abs: 4795 cells/uL (ref 1500–7800)
Neutrophils Relative %: 68.5 %
Platelets: 206 10*3/uL (ref 140–400)
RBC: 5 10*6/uL (ref 3.80–5.10)
RDW: 12.6 % (ref 11.0–15.0)
Total Lymphocyte: 20 %
WBC: 7 10*3/uL (ref 3.8–10.8)

## 2019-07-16 LAB — COMPREHENSIVE METABOLIC PANEL
AG Ratio: 1.8 (calc) (ref 1.0–2.5)
ALT: 19 U/L (ref 6–29)
AST: 23 U/L (ref 10–35)
Albumin: 4.6 g/dL (ref 3.6–5.1)
Alkaline phosphatase (APISO): 81 U/L (ref 37–153)
BUN: 12 mg/dL (ref 7–25)
CO2: 28 mmol/L (ref 20–32)
Calcium: 9.7 mg/dL (ref 8.6–10.4)
Chloride: 107 mmol/L (ref 98–110)
Creat: 0.9 mg/dL (ref 0.50–0.99)
Globulin: 2.6 g/dL (calc) (ref 1.9–3.7)
Glucose, Bld: 102 mg/dL — ABNORMAL HIGH (ref 65–99)
Potassium: 5 mmol/L (ref 3.5–5.3)
Sodium: 143 mmol/L (ref 135–146)
Total Bilirubin: 1.3 mg/dL — ABNORMAL HIGH (ref 0.2–1.2)
Total Protein: 7.2 g/dL (ref 6.1–8.1)

## 2019-07-16 LAB — T3, FREE: T3, Free: 3 pg/mL (ref 2.3–4.2)

## 2019-07-16 LAB — LIPID PANEL
Cholesterol: 172 mg/dL (ref <200)
HDL: 66 mg/dL (ref >=50)
LDL Cholesterol (Calc): 88 mg/dL (calc)
Non-HDL Cholesterol (Calc): 106 mg/dL (calc) (ref <130)
Total CHOL/HDL Ratio: 2.6 (calc) (ref <5.0)
Triglycerides: 89 mg/dL (ref <150)

## 2019-07-16 LAB — T4, FREE: Free T4: 1.2 ng/dL (ref 0.8–1.8)

## 2019-07-16 LAB — TSH: TSH: 4.08 mIU/L (ref 0.40–4.50)

## 2019-07-27 DIAGNOSIS — Z1231 Encounter for screening mammogram for malignant neoplasm of breast: Secondary | ICD-10-CM | POA: Diagnosis not present

## 2019-08-03 DIAGNOSIS — H401132 Primary open-angle glaucoma, bilateral, moderate stage: Secondary | ICD-10-CM | POA: Diagnosis not present

## 2019-08-27 NOTE — Progress Notes (Signed)
Office Visit Note  Patient: Kathleen Mcclain             Date of Birth: 1950/08/25           MRN: [place]:[id number]             PCP: Alycia Rossetti, MD Referring: Alycia Rossetti, MD Visit Date: 09/03/2019 Occupation: @GUAROCC @  Subjective:  Dry mouth and dry eyes.   History of Present Illness: Kathleen Mcclain is a 69 y.o. female with history of positive ANA and double-stranded DNA.  She states she continues to have dry mouth and dry eyes.  She recently had lacrimal duct plugging due to dry eyes.  She is also noticed some swelling in her left ankle for the last 1 month.  She does not have any discomfort in her left ankle.  She notices some discomfort in her right second knuckle.  She has noticed improvement in the hair loss.  Activities of Daily Living:  Patient reports morning stiffness for 0 minutes.   Patient Denies nocturnal pain.  Difficulty dressing/grooming: Denies Difficulty climbing stairs: Denies Difficulty getting out of chair: Denies Difficulty using hands for taps, buttons, cutlery, and/or writing: Reports  Review of Systems  Constitutional: Positive for fatigue. Negative for night sweats, weight gain and weight loss.  HENT: Positive for mouth dryness and nose dryness. Negative for mouth sores, trouble swallowing and trouble swallowing.   Eyes: Positive for pain and dryness. Negative for redness, itching and visual disturbance.  Respiratory: Negative for cough, shortness of breath and difficulty breathing.   Cardiovascular: Negative for chest pain, palpitations, hypertension, irregular heartbeat and swelling in legs/feet.  Gastrointestinal: Negative for blood in stool, constipation and diarrhea.  Endocrine: Negative for increased urination.  Genitourinary: Negative for difficulty urinating, painful urination and vaginal dryness.  Musculoskeletal: Positive for joint swelling. Negative for arthralgias, joint pain, myalgias, muscle weakness, morning stiffness, muscle  tenderness and myalgias.  Skin: Positive for hair loss. Negative for color change, rash, redness, skin tightness, ulcers and sensitivity to sunlight.  Allergic/Immunologic: Negative for susceptible to infections.  Neurological: Negative for dizziness, numbness, headaches, memory loss, night sweats and weakness.  Hematological: Negative for bruising/bleeding tendency and swollen glands.  Psychiatric/Behavioral: Negative for depressed mood, confusion and sleep disturbance. The patient is not nervous/anxious.     PMFS History:  Patient Active Problem List   Diagnosis Date Noted  . Hepatic steatosis 04/02/2018  . Elevated liver function tests 04/02/2018  . Autoimmune disease ([place]) 04/02/2018  . Essential hypertension 03/21/2017  . Hypothyroidism 03/09/2015  . Hyperglycemia 03/09/2015  . Allergy   . Cataract   . Glaucoma   . Hypercholesteremia   . Personal history of kidney stones     Past Medical History:  Diagnosis Date  . Allergy   . Arthritis   . 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 Cataract   . Glaucoma   . History of kidney stones   . Hypercholesteremia   . Hypothyroid   . Hypothyroidism   . Personal history of kidney stones   . Pre-diabetes     Family History  Problem Relation Age of Onset  . Alzheimer's disease Mother   . Cancer Father        Pancreatic Cancer  . Hyperlipidemia Father   . Arthritis Maternal Grandmother   . Diabetes Maternal Grandfather   . Stroke Maternal Grandfather   . Cancer Paternal Grandmother   . Cancer Paternal Grandfather   . Thyroid  disease Sister   . Celiac disease Sister   . Thyroid disease Brother   . Anxiety disorder Son   . Healthy Son   . Healthy Daughter   . Healthy Daughter    Past Surgical History:  Procedure Laterality Date  . CERVICAL BIOPSY  12/07   pre cancerous cells  . COLONOSCOPY    . DACRORHINOCYSTOTOMY Left 08/31/2017   Procedure: DACROCYSTORHINOSTOMY (DCR);  Surgeon: Clista Bernhardt, MD;   Location: [facility];  Service: Ophthalmology;  Laterality: Left;  . ETHMOIDECTOMY Left 08/31/2017   Procedure: ETHMOIDECTOMY, anterior;  Surgeon: Clista Bernhardt, MD;  Location: [facility];  Service: Ophthalmology;  Laterality: Left;  . EYE SURGERY Left    tear duct surgery, x4  . LIPOMA EXCISION     on back   . TEAR DUCT PROBING Left 02/20/2017   Procedure: nasolacrimal duct obstruct dacryocystorhinostomy left eye, anterior ephmoidectomy left eye, probe with stent left eye;  Surgeon: Clista Bernhardt, MD;  Location: [facility];  Service: Ophthalmology;  Laterality: Left;  . TONSILLECTOMY    . TUBAL LIGATION  5/86   Social History   Social History Narrative   Does not work outside of home.    Has 2 sets of grandchildren--1 set lives in this area. 1 set lives at [place].   Approximately 2013-- Was told "PreDiabetic"---She lost 30 pounds. Very careful with diet now. Does not exercise.   Immunization History  Administered Date(s) Administered  . Fluad Quad(high Dose 65+) 01/20/2019  . Influenza Inj Mdck Quad With Preservative 01/21/2018  . Influenza,inj,Quad PF,6+ Mos 03/09/2015, 03/05/2016, 01/30/2017, 01/02/2018  . Influenza-Unspecified 01/23/2012, 01/29/2013, 02/01/2014, 03/09/2015, 02/22/2016, 01/21/2017  . Pneumococcal Conjugate-13 04/02/2018  . Pneumococcal Polysaccharide-23 07/15/2019  . Td 04/23/2005  . Tdap 01/29/2013  . Zoster 03/09/2015     Objective: Vital Signs: BP (!) 150/78 (BP Location: Left Arm, Patient Position: Sitting, Cuff Size: Normal)   Pulse 71   Resp 12   Ht 5\' 4"  (1.626 m)   Wt 129 lb (58.5 kg)   BMI 22.14 kg/m    Physical Exam Vitals and nursing note reviewed.  Constitutional:      Appearance: She is well-developed.  HENT:     Head: Normocephalic and atraumatic.  Eyes:     Conjunctiva/sclera: Conjunctivae normal.  Cardiovascular:     Rate and Rhythm: Normal rate and regular rhythm.     Heart sounds: Normal heart sounds.  Pulmonary:     Effort:  Pulmonary effort is normal.     Breath sounds: Normal breath sounds.  Abdominal:     General: Bowel sounds are normal.     Palpations: Abdomen is soft.  Musculoskeletal:     Cervical back: Normal range of motion.  Lymphadenopathy:     Cervical: No cervical adenopathy.  Skin:    General: Skin is warm and dry.     Capillary Refill: Capillary refill takes less than 2 seconds.  Neurological:     Mental Status: She is alert and oriented to person, place, and time.  Psychiatric:        Behavior: Behavior normal.      Musculoskeletal Exam: C-spine thoracic and lumbar spine with good range of motion.  Shoulder joints, elbow joints, wrist joints, MCPs PIPs and DIPs with good range of motion with no synovitis.  Hip joints, knee joints, ankles, MTPs and DIPs with good range of motion with no synovitis.  CDAI Exam: CDAI Score: -- Patient Global: --; Provider Global: -- Swollen: --;  Tender: -- Joint Exam 09/03/2019   No joint exam has been documented for this visit   There is currently no information documented on the homunculus. Go to the Rheumatology activity and complete the homunculus joint exam.  Investigation: No additional findings.  Imaging: No results found.  Recent Labs: Lab Results  Component Value Date   WBC 7.0 07/15/2019   HGB 14.6 07/15/2019   PLT 206 07/15/2019   NA 143 07/15/2019   K 5.0 07/15/2019   CL 107 07/15/2019   CO2 28 07/15/2019   GLUCOSE 102 (H) 07/15/2019   BUN 12 07/15/2019   CREATININE 0.90 07/15/2019   BILITOT 1.3 (H) 07/15/2019   ALKPHOS 79 09/19/2016   AST 23 07/15/2019   ALT 19 07/15/2019   PROT 7.2 07/15/2019   ALBUMIN 4.3 09/19/2016   CALCIUM 9.7 07/15/2019   GFRAA 88 11/28/2017    Speciality Comments: PLQ eye exam: 03/13/2019 baseline WNL. Follow up in 1 year. (starting PLQ per Dr. Edilia Bo).   July 02, 2019 ANA 1: 640 speckled, dsDNA greater than 1000, (rest of the ENA negative) anticentromere positive, CB CAP negative, beta-2  negative, anticardiolipin negative, antiphosphatidylserine negative, antihistone positive, RF negative, anti-CCP negative, anticar P-, anti-TPO positive, antithyroglobulin positive  Procedures:  No procedures performed Allergies: Penicillins and Alphagan [brimonidine]   Assessment / Plan:     Visit Diagnoses: Sjogren's syndrome with keratoconjunctivitis sicca (HCC)-she continues to have severe sicca symptoms.  She recently had lacrimal duct plugging.  She has positive ANA and positive double-stranded DNA but does not have any features of systemic lupus.  She also has history of fatigue.  The hair loss has improved.  She gives history of some joint pain in her right second MCP joint and left ankle joint but no synovitis was noted on examination today.  We are detailed discussion regarding starting Plaquenil.  She was in agreement and wants to proceed with it.  Indications, side effects and contraindications were discussed at length.  Handout was given and consent was taken.  We will send prescription for Plaquenil 200 mg p.o. twice daily Monday to Friday.  She already had a baseline eye examination.  She will get eye examination on yearly basis.  She is been advised to get labs in a month and then every 3 months.  If stable then will change to every 3 months.  Patient was counseled on the purpose, proper use, and adverse effects of hydroxychloroquine including nausea/diarrhea, skin rash, headaches, and sun sensitivity.  Discussed importance of annual eye exams while on hydroxychloroquine to monitor to ocular toxicity and discussed importance of frequent laboratory monitoring.  Provided patient with eye exam form for baseline ophthalmologic exam.  Provided patient with educational materials on hydroxychloroquine and answered all questions.  Patient consented to hydroxychloroquine.  Will upload consent in the media tab.      Positive ANA (antinuclear antibody) - ANA 1: 640H dsDNA >1000, and positive  antithyroid peroxidase IgG, hx of fatigue, sicca sx, hair loss  Positive double stranded DNA antibody test-patient has no clinical features of lupus.  High risk medication use-she will be starting Plaquenil 200 mg p.o. twice daily Monday to Friday.  Other fatigue-she continues to have fatigue.  Primary osteoarthritis of both hands-she has arthritis in her both hands.  Other medical problems are listed as follows:  Hypercholesteremia  Essential hypertension-blood pressure is elevated.  She has been advised to monitor blood pressure closely.  History of hypothyroidism  History of glaucoma-she is followed by ophthalmologist.  Orders: No orders of the defined types were placed in this encounter.  Meds ordered this encounter  Medications  . hydroxychloroquine (PLAQUENIL) 200 MG tablet    Sig: Take 1 tablet by mouth twice daily, Monday through Friday. None on Saturday or Sunday.    Dispense:  40 tablet    Refill:  2    Face-to-face time spent with patient was 30 minutes. Greater than 50% of time was spent in counseling and coordination of care.  Follow-Up Instructions: Return in about 6 weeks (around 10/15/2019) for Sjogren's.   Bo Merino, MD  Note - This record has been created using Editor, commissioning.  Chart creation errors have been sought, but may not always  have been located. Such creation errors do not reflect on  the standard of medical care.

## 2019-09-03 ENCOUNTER — Ambulatory Visit (INDEPENDENT_AMBULATORY_CARE_PROVIDER_SITE_OTHER): Payer: Medicare Other | Admitting: Rheumatology

## 2019-09-03 ENCOUNTER — Other Ambulatory Visit: Payer: Self-pay

## 2019-09-03 ENCOUNTER — Encounter: Payer: Self-pay | Admitting: Rheumatology

## 2019-09-03 VITALS — BP 150/78 | HR 71 | Resp 12 | Ht 64.0 in | Wt 129.0 lb

## 2019-09-03 DIAGNOSIS — M3501 Sicca syndrome with keratoconjunctivitis: Secondary | ICD-10-CM

## 2019-09-03 DIAGNOSIS — R5383 Other fatigue: Secondary | ICD-10-CM | POA: Diagnosis not present

## 2019-09-03 DIAGNOSIS — Z8639 Personal history of other endocrine, nutritional and metabolic disease: Secondary | ICD-10-CM

## 2019-09-03 DIAGNOSIS — I10 Essential (primary) hypertension: Secondary | ICD-10-CM | POA: Diagnosis not present

## 2019-09-03 DIAGNOSIS — R768 Other specified abnormal immunological findings in serum: Secondary | ICD-10-CM

## 2019-09-03 DIAGNOSIS — Z79899 Other long term (current) drug therapy: Secondary | ICD-10-CM

## 2019-09-03 DIAGNOSIS — M19041 Primary osteoarthritis, right hand: Secondary | ICD-10-CM | POA: Diagnosis not present

## 2019-09-03 DIAGNOSIS — E039 Hypothyroidism, unspecified: Secondary | ICD-10-CM

## 2019-09-03 DIAGNOSIS — Z8669 Personal history of other diseases of the nervous system and sense organs: Secondary | ICD-10-CM | POA: Diagnosis not present

## 2019-09-03 DIAGNOSIS — E7800 Pure hypercholesterolemia, unspecified: Secondary | ICD-10-CM | POA: Diagnosis not present

## 2019-09-03 DIAGNOSIS — R7689 Other specified abnormal immunological findings in serum: Secondary | ICD-10-CM

## 2019-09-03 DIAGNOSIS — M3500 Sicca syndrome, unspecified: Secondary | ICD-10-CM

## 2019-09-03 DIAGNOSIS — M19042 Primary osteoarthritis, left hand: Secondary | ICD-10-CM | POA: Diagnosis not present

## 2019-09-03 MED ORDER — HYDROXYCHLOROQUINE SULFATE 200 MG PO TABS: ORAL_TABLET | ORAL | 2 refills | Status: DC

## 2019-09-03 NOTE — Patient Instructions (Signed)
Standing Labs We placed an order today for your standing lab work.    Please come back and get your standing labs in 1 month, 3 months and then every 5 months  We have open lab daily Monday through Thursday from 8:30-12:30 PM and 1:30-4:30 PM and Friday from 8:30-12:30 PM and 1:30-4:00 PM at the office of Dr. Bo Merino.   You may experience shorter wait times on Monday and Friday afternoons. The office is located at [address] [place], [place], [place] [address] No appointment is necessary.   Labs are drawn by [facility].  You may receive a bill from [facility] for your lab work.  If you wish to have your labs drawn at another location, please call the office 24 hours in advance to send orders.  If you have any questions regarding directions or hours of operation,  please call [phone].   Just as a reminder please drink plenty of water prior to coming for your lab work. Thanks!  Hydroxychloroquine tablets What is this medicine? HYDROXYCHLOROQUINE (hye drox ee KLOR oh kwin) is used to treat rheumatoid arthritis and systemic lupus erythematosus. It is also used to treat malaria. This medicine may be used for other purposes; ask your health care provider or pharmacist if you have questions. COMMON BRAND NAME(S): Plaquenil, Quineprox What should I tell my health care provider before I take this medicine? They need to know if you have any of these conditions:  diabetes  eye disease, vision problems  G6PD deficiency  heart disease  history of irregular heartbeat  if you often drink alcohol  kidney disease  liver disease  porphyria  psoriasis  an unusual or allergic reaction to chloroquine, hydroxychloroquine, other medicines, foods, dyes, or preservatives  pregnant or trying to get pregnant  breast-feeding How should I use this medicine? Take this medicine by mouth with a glass of water. Follow the directions on the prescription label. Do not cut, crush or  chew this medicine. Swallow the tablets whole. Take this medicine with food. Avoid taking antacids within 4 hours of taking this medicine. It is best to separate these medicines by at least 4 hours. Take your medicine at regular intervals. Do not take it more often than directed. Take all of your medicine as directed even if you think you are better. Do not skip doses or stop your medicine early. Talk to your pediatrician regarding the use of this medicine in children. While this drug may be prescribed for selected conditions, precautions do apply. Overdosage: If you think you have taken too much of this medicine contact a poison control center or emergency room at once. NOTE: This medicine is only for you. Do not share this medicine with others. What if I miss a dose? If you miss a dose, take it as soon as you can. If it is almost time for your next dose, take only that dose. Do not take double or extra doses. What may interact with this medicine? Do not take this medicine with any of the following medications:  cisapride  dronedarone  pimozide  thioridazine This medicine may also interact with the following medications:  ampicillin  antacids  cimetidine  cyclosporine  digoxin  kaolin  medicines for diabetes, like insulin, glipizide, glyburide  medicines for seizures like carbamazepine, phenobarbital, phenytoin  mefloquine  methotrexate  other medicines that prolong the QT interval (cause an abnormal heart rhythm)  praziquantel This list may not describe all possible interactions. Give your health care provider a  list of all the medicines, herbs, non-prescription drugs, or dietary supplements you use. Also tell them if you smoke, drink alcohol, or use illegal drugs. Some items may interact with your medicine. What should I watch for while using this medicine? Visit your health care professional for regular checks on your progress. Tell your health care professional if your  symptoms do not start to get better or if they get worse. You may need blood work done while you are taking this medicine. If you take other medicines that can affect heart rhythm, you may need more testing. Talk to your health care professional if you have questions. Your vision may be tested before and during use of this medicine. Tell your health care professional right away if you have any change in your eyesight. What side effects may I notice from receiving this medicine? Side effects that you should report to your doctor or health care professional as soon as possible:  allergic reactions like skin rash, itching or hives, swelling of the face, lips, or tongue  changes in vision  decreased hearing or ringing of the ears  muscle weakness  redness, blistering, peeling or loosening of the skin, including inside the mouth  sensitivity to light  signs and symptoms of a dangerous change in heartbeat or heart rhythm like chest pain; dizziness; fast or irregular heartbeat; palpitations; feeling faint or lightheaded, falls; breathing problems  signs and symptoms of liver injury like dark yellow or brown urine; general ill feeling or flu-like symptoms; light-colored stools; loss of appetite; nausea; right upper belly pain; unusually weak or tired; yellowing of the eyes or skin  signs and symptoms of low blood sugar such as feeling anxious; confusion; dizziness; increased hunger; unusually weak or tired; sweating; shakiness; cold; irritable; headache; blurred vision; fast heartbeat; loss of consciousness  suicidal thoughts  uncontrollable head, mouth, neck, arm, or leg movements Side effects that usually do not require medical attention (report to your doctor or health care professional if they continue or are bothersome):  diarrhea  dizziness  hair loss  headache  irritable  loss of appetite  nausea, vomiting  stomach pain This list may not describe all possible side effects.  Call your doctor for medical advice about side effects. You may report side effects to FDA at 1-800-FDA-1088. Where should I keep my medicine? Keep out of the reach of children. Store at room temperature between 15 and 30 degrees C (59 and 86 degrees F). Protect from moisture and light. Throw away any unused medicine after the expiration date. NOTE: This sheet is a summary. It may not cover all possible information. If you have questions about this medicine, talk to your doctor, pharmacist, or health care provider.  2020 Elsevier/Gold Standard (2018-08-18 12:56:32)

## 2019-09-08 DIAGNOSIS — Z23 Encounter for immunization: Secondary | ICD-10-CM | POA: Diagnosis not present

## 2019-09-29 ENCOUNTER — Other Ambulatory Visit: Payer: Self-pay | Admitting: Family Medicine

## 2019-09-29 DIAGNOSIS — Z23 Encounter for immunization: Secondary | ICD-10-CM | POA: Diagnosis not present

## 2019-09-30 DIAGNOSIS — H401132 Primary open-angle glaucoma, bilateral, moderate stage: Secondary | ICD-10-CM | POA: Diagnosis not present

## 2019-10-01 ENCOUNTER — Other Ambulatory Visit: Payer: Self-pay | Admitting: Family Medicine

## 2019-10-01 NOTE — Progress Notes (Signed)
Office Visit Note  Patient: Kathleen Mcclain             Date of Birth: 1951-04-15           MRN: [id number]             PCP: Alycia Rossetti, MD Referring: Alycia Rossetti, MD Visit Date: 10/15/2019 Occupation: @GUAROCC @  Subjective:  Medication management.   History of Present Illness: Kathleen Mcclain is a 69 y.o. female diagnosed with Sjogren's syndrome at last visit on 09/03/2019.  She was started on Plaquenil 200 mg twice daily Monday through Friday only. Denies missing any doses or obtaining from the pharmacy.  She reports that she developed diarrhea/stomach after 3 weeks of starting Plaquenil.  She states that she had another recurrence this Tuesday.  Reports that it happens after her evening dose. Reports that hand and ankle discomfort have improved.  Reports no change in dry mouth and dry eye.   Activities of Daily Living:  Patient reports morning stiffness for 0  minutes.   Patient Denies nocturnal pain.  Difficulty dressing/grooming: Denies Difficulty climbing stairs: Denies Difficulty getting out of chair: Denies Difficulty using hands for taps, buttons, cutlery, and/or writing: Reports  Review of Systems  Constitutional: Negative for fatigue, night sweats, weight gain and weight loss.  HENT: Positive for mouth dryness and nose dryness. Negative for mouth sores, trouble swallowing and trouble swallowing.   Eyes: Positive for dryness. Negative for pain, redness, itching and visual disturbance.  Respiratory: Negative for cough, shortness of breath and difficulty breathing.   Cardiovascular: Negative for chest pain, palpitations, hypertension, irregular heartbeat and swelling in legs/feet.  Gastrointestinal: Positive for diarrhea. Negative for blood in stool, constipation, nausea and vomiting.  Endocrine: Negative for increased urination.  Genitourinary: Negative for difficulty urinating, painful urination and vaginal dryness.  Musculoskeletal: Negative for  arthralgias, joint pain, joint swelling, myalgias, muscle weakness, morning stiffness, muscle tenderness and myalgias.  Skin: Negative for color change, rash, hair loss, redness, skin tightness, ulcers and sensitivity to sunlight.  Allergic/Immunologic: Negative for susceptible to infections.  Neurological: Negative for dizziness, numbness, headaches, memory loss, night sweats and weakness.  Hematological: Negative for bruising/bleeding tendency and swollen glands.  Psychiatric/Behavioral: Negative for depressed mood, confusion and sleep disturbance. The patient is not nervous/anxious.     PMFS History:  Patient Active Problem List   Diagnosis Date Noted  . Hepatic steatosis 04/02/2018  . Elevated liver function tests 04/02/2018  . Autoimmune disease ([place]) 04/02/2018  . Essential hypertension 03/21/2017  . Hypothyroidism 03/09/2015  . Hyperglycemia 03/09/2015  . Allergy   . Cataract   . Glaucoma   . Hypercholesteremia   . Personal history of kidney stones     Past Medical History:  Diagnosis Date  . Allergy   . Arthritis   . Cancer ([place])    pre cancer cells cervical  . Cataract   . Glaucoma   . History of kidney stones   . Hypercholesteremia   . Hypothyroid   . Hypothyroidism   . Personal history of kidney stones   . Pre-diabetes     Family History  Problem Relation Age of Onset  . Alzheimer's disease Mother   . Cancer Father        Pancreatic Cancer  . Hyperlipidemia Father   . Arthritis Maternal Grandmother   . Diabetes Maternal Grandfather   . Stroke Maternal Grandfather   . Cancer Paternal Grandmother   . Cancer Paternal Grandfather   .  Thyroid disease Sister   . Celiac disease Sister   . Thyroid disease Brother   . Anxiety disorder Son   . Healthy Son   . Healthy Daughter   . Healthy Daughter    Past Surgical History:  Procedure Laterality Date  . CERVICAL BIOPSY  12/07   pre cancerous cells  . COLONOSCOPY    . DACRORHINOCYSTOTOMY Left 08/31/2017    Procedure: DACROCYSTORHINOSTOMY (DCR);  Surgeon: Clista Bernhardt, MD;  Location: [place];  Service: Ophthalmology;  Laterality: Left;  . ETHMOIDECTOMY Left 08/31/2017   Procedure: ETHMOIDECTOMY, anterior;  Surgeon: Clista Bernhardt, MD;  Location: [place] [facility];  Service: Ophthalmology;  Laterality: Left;  . EYE SURGERY Left    tear duct surgery, x4  . LIPOMA EXCISION     on back   . TEAR DUCT PROBING Left 02/20/2017   Procedure: nasolacrimal duct obstruct dacryocystorhinostomy left eye, anterior ephmoidectomy left eye, probe with stent left eye;  Surgeon: Clista Bernhardt, MD;  Location: [place];  Service: Ophthalmology;  Laterality: Left;  . TONSILLECTOMY    . TUBAL LIGATION  5/86   Social History   Social History Narrative   Does not work outside of home.    Has 2 sets of grandchildren--1 set lives in this area. 1 set lives at [place] [facility].   Approximately 2013-- Was told "PreDiabetic"---She lost 30 pounds. Very careful with diet now. Does not exercise.   Immunization History  Administered Date(s) Administered  . Fluad Quad(high Dose 65+) 01/20/2019  . Influenza Inj Mdck Quad With Preservative 01/21/2018  . Influenza,inj,Quad PF,6+ Mos 03/09/2015, 03/05/2016, 01/30/2017, 01/02/2018  . Influenza-Unspecified 01/23/2012, 01/29/2013, 02/01/2014, 03/09/2015, 02/22/2016, 01/21/2017  . PFIZER SARS-COV-2 Vaccination 09/08/2019, 09/29/2019  . Pneumococcal Conjugate-13 04/02/2018  . Pneumococcal Polysaccharide-23 07/15/2019  . Td 04/23/2005  . Tdap 01/29/2013  . Zoster 03/09/2015     Objective: Vital Signs: BP (!) 155/82 (BP Location: Left Arm, Patient Position: Sitting, Cuff Size: Normal)   Pulse 78   Resp 14   Ht 5\' 4"  (1.626 m)   Wt 128 lb (58.1 kg)   BMI 21.97 kg/m    Physical Exam Vitals and nursing note reviewed.  Constitutional:      Appearance: She is well-developed.  HENT:     Head: Normocephalic and atraumatic.  Eyes:     Conjunctiva/sclera: Conjunctivae normal.    Cardiovascular:     Rate and Rhythm: Normal rate and regular rhythm.     Heart sounds: Normal heart sounds.  Pulmonary:     Effort: Pulmonary effort is normal.     Breath sounds: Normal breath sounds.  Abdominal:     General: Bowel sounds are normal.     Palpations: Abdomen is soft.  Musculoskeletal:     Cervical back: Normal range of motion.  Lymphadenopathy:     Cervical: No cervical adenopathy.  Skin:    General: Skin is warm and dry.     Capillary Refill: Capillary refill takes less than 2 seconds.  Neurological:     Mental Status: She is alert and oriented to person, place, and time.  Psychiatric:        Behavior: Behavior normal.      Musculoskeletal Exam: C-spine, thoracic and lumbar spine were in good range of motion.  Shoulder joints, elbow joints, wrist joints with good range of motion.  She has DIP and PIP thickening with no synovitis.  Hip joints, knee joints, ankles and MTPs and PIPs with good range of motion with no synovitis.  CDAI Exam: CDAI Score: -- Patient Global: --; Provider Global: -- Swollen: --; Tender: -- Joint Exam 10/15/2019   No joint exam has been documented for this visit   There is currently no information documented on the homunculus. Go to the Rheumatology activity and complete the homunculus joint exam.  Investigation: No additional findings.  Imaging: No results found.  Recent Labs: Lab Results  Component Value Date   WBC 6.4 10/09/2019   HGB 13.8 10/09/2019   PLT 212 10/09/2019   NA 143 10/09/2019   K 4.9 10/09/2019   CL 107 10/09/2019   CO2 31 10/09/2019   GLUCOSE 107 (H) 10/09/2019   BUN 13 10/09/2019   CREATININE 0.92 10/09/2019   BILITOT 1.1 10/09/2019   ALKPHOS 79 09/19/2016   AST 18 10/09/2019   ALT 14 10/09/2019   PROT 6.7 10/09/2019   ALBUMIN 4.3 09/19/2016   CALCIUM 10.2 10/09/2019   GFRAA 75 10/09/2019  October 09, 2019 CBC and CMP normal.  Speciality Comments: PLQ eye exam: 03/13/2019 baseline WNL. Follow up  in 1 year. (starting PLQ per Dr. Edilia Bo).  Procedures:  No procedures performed Allergies: Penicillins and Alphagan [brimonidine]   Assessment / Plan:     Visit Diagnoses: Sjogren's syndrome with keratoconjunctivitis sicca (HCC)-she continues to have sicca symptoms.  Over-the-counter products were discussed.  Positive ANA (antinuclear antibody) - ANA 1: 640H dsDNA >1000, and positive antithyroid peroxidase IgG, hx of fatigue, sicca sx, hair loss.  She was placed on Plaquenil at the last visit.  She has been tolerating it.  She had some intermittent diarrhea.  Management of diarrhea was also discussed.  Positive double stranded DNA antibody test-she is significantly high titer.  High risk medication use - Plaquenil 200 mg p.o. twice daily Monday to Friday.PLQ eye exam: 03/13/2019 - Plan: CBC with Differential/Platelet, COMPLETE METABOLIC PANEL WITH GFR in 3 months and then every 5 months.  Her labs in June were within normal limits.  Other fatigue-improved.  Primary osteoarthritis of both hands-joint protection muscle strengthening was discussed.  Other medical problems are listed as follows:  History of glaucoma  Essential hypertension  Hypercholesteremia  History of hypothyroidism  Orders: Orders Placed This Encounter  Procedures  . CBC with Differential/Platelet  . COMPLETE METABOLIC PANEL WITH GFR   No orders of the defined types were placed in this encounter.   .  Follow-Up Instructions: Return in about 3 months (around 01/15/2020) for Sjogren's.   Bo Merino, MD  Note - This record has been created using Editor, commissioning.  Chart creation errors have been sought, but may not always  have been located. Such creation errors do not reflect on  the standard of medical care.

## 2019-10-09 ENCOUNTER — Other Ambulatory Visit: Payer: Self-pay | Admitting: *Deleted

## 2019-10-09 DIAGNOSIS — Z79899 Other long term (current) drug therapy: Secondary | ICD-10-CM

## 2019-10-10 LAB — CBC WITH DIFFERENTIAL/PLATELET
Absolute Monocytes: 538 cells/uL (ref 200–950)
Basophils Absolute: 58 cells/uL (ref 0–200)
Basophils Relative: 0.9 %
Eosinophils Absolute: 230 cells/uL (ref 15–500)
Eosinophils Relative: 3.6 %
HCT: 43 % (ref 35.0–45.0)
Hemoglobin: 13.8 g/dL (ref 11.7–15.5)
Lymphs Abs: 1453 cells/uL (ref 850–3900)
MCH: 29.3 pg (ref 27.0–33.0)
MCHC: 32.1 g/dL (ref 32.0–36.0)
MCV: 91.3 fL (ref 80.0–100.0)
MPV: 12.2 fL (ref 7.5–12.5)
Monocytes Relative: 8.4 %
Neutro Abs: 4122 cells/uL (ref 1500–7800)
Neutrophils Relative %: 64.4 %
Platelets: 212 10*3/uL (ref 140–400)
RBC: 4.71 10*6/uL (ref 3.80–5.10)
RDW: 12.2 % (ref 11.0–15.0)
Total Lymphocyte: 22.7 %
WBC: 6.4 10*3/uL (ref 3.8–10.8)

## 2019-10-10 LAB — COMPLETE METABOLIC PANEL WITH GFR
AG Ratio: 1.7 (calc) (ref 1.0–2.5)
ALT: 14 U/L (ref 6–29)
AST: 18 U/L (ref 10–35)
Albumin: 4.2 g/dL (ref 3.6–5.1)
Alkaline phosphatase (APISO): 66 U/L (ref 37–153)
BUN: 13 mg/dL (ref 7–25)
CO2: 31 mmol/L (ref 20–32)
Calcium: 10.2 mg/dL (ref 8.6–10.4)
Chloride: 107 mmol/L (ref 98–110)
Creat: 0.92 mg/dL (ref 0.50–0.99)
GFR, Est African American: 75 mL/min/{1.73_m2} (ref >=60)
GFR, Est Non African American: 65 mL/min/{1.73_m2} (ref >=60)
Globulin: 2.5 g/dL (calc) (ref 1.9–3.7)
Glucose, Bld: 107 mg/dL — ABNORMAL HIGH (ref 65–99)
Potassium: 4.9 mmol/L (ref 3.5–5.3)
Sodium: 143 mmol/L (ref 135–146)
Total Bilirubin: 1.1 mg/dL (ref 0.2–1.2)
Total Protein: 6.7 g/dL (ref 6.1–8.1)

## 2019-10-12 NOTE — Progress Notes (Signed)
CBC, CMP are normal

## 2019-10-15 ENCOUNTER — Ambulatory Visit (INDEPENDENT_AMBULATORY_CARE_PROVIDER_SITE_OTHER): Payer: Medicare Other | Admitting: Rheumatology

## 2019-10-15 ENCOUNTER — Encounter: Payer: Self-pay | Admitting: Rheumatology

## 2019-10-15 ENCOUNTER — Other Ambulatory Visit: Payer: Self-pay

## 2019-10-15 VITALS — BP 155/82 | HR 78 | Resp 14 | Ht 64.0 in | Wt 128.0 lb

## 2019-10-15 DIAGNOSIS — R768 Other specified abnormal immunological findings in serum: Secondary | ICD-10-CM | POA: Diagnosis not present

## 2019-10-15 DIAGNOSIS — E7800 Pure hypercholesterolemia, unspecified: Secondary | ICD-10-CM

## 2019-10-15 DIAGNOSIS — M3501 Sicca syndrome with keratoconjunctivitis: Secondary | ICD-10-CM | POA: Diagnosis not present

## 2019-10-15 DIAGNOSIS — Z8669 Personal history of other diseases of the nervous system and sense organs: Secondary | ICD-10-CM

## 2019-10-15 DIAGNOSIS — M19042 Primary osteoarthritis, left hand: Secondary | ICD-10-CM

## 2019-10-15 DIAGNOSIS — Z79899 Other long term (current) drug therapy: Secondary | ICD-10-CM | POA: Diagnosis not present

## 2019-10-15 DIAGNOSIS — I10 Essential (primary) hypertension: Secondary | ICD-10-CM

## 2019-10-15 DIAGNOSIS — M19041 Primary osteoarthritis, right hand: Secondary | ICD-10-CM

## 2019-10-15 DIAGNOSIS — Z8639 Personal history of other endocrine, nutritional and metabolic disease: Secondary | ICD-10-CM

## 2019-10-15 DIAGNOSIS — R5383 Other fatigue: Secondary | ICD-10-CM

## 2019-10-15 NOTE — Patient Instructions (Signed)
Standing Labs We placed an order today for your standing lab work.   Please have your standing labs drawn in September  If possible, please have your labs drawn 2 weeks prior to your appointment so that the provider can discuss your results at your appointment.  We have open lab daily Monday through Thursday from 8:30-12:30 PM and 1:30-4:30 PM and Friday from 8:30-12:30 PM and 1:30-4:00 PM at the office of Dr. Bo Merino, [facility].   You may experience shorter wait times on Monday and Friday afternoons. The office is located at [address], [place], [place], [place] [address] No appointment is necessary.   Labs are drawn by [facility].  You may receive a bill from [facility] for your lab work.  If you wish to have your labs drawn at another location, please call the office 24 hours in advance to send orders.  If you have any questions regarding directions or hours of operation,  please call [phone].   As a reminder, please drink plenty of water prior to coming for your lab work. Thanks!

## 2019-10-31 ENCOUNTER — Other Ambulatory Visit: Payer: Self-pay | Admitting: Family Medicine

## 2019-11-09 DIAGNOSIS — H401132 Primary open-angle glaucoma, bilateral, moderate stage: Secondary | ICD-10-CM | POA: Diagnosis not present

## 2019-12-01 ENCOUNTER — Other Ambulatory Visit: Payer: Self-pay | Admitting: Rheumatology

## 2019-12-01 NOTE — Telephone Encounter (Signed)
Last Visit: 10/15/2019 Next Visit: 01/18/2020 Labs: 10/09/2019 CBC, CMP are normal. PLQ eye exam: 03/13/2019 baseline WNL  Current Dose per office note 10/09/2019: Plaquenil 200 mg p.o. twice daily Monday to Friday.  Okay to refill per Dr. Estanislado Pandy

## 2020-01-04 NOTE — Progress Notes (Deleted)
Office Visit Note  Patient: Kathleen Mcclain             Date of Birth: 02-02-1951           MRN: [id number]             PCP: Alycia Rossetti, MD Referring: Alycia Rossetti, MD Visit Date: 01/18/2020 Occupation: @GUAROCC @  Subjective:  No chief complaint on file.   History of Present Illness: Kathleen Mcclain is a 69 y.o. female ***   Activities of Daily Living:  Patient reports morning stiffness for *** {minute/hour:19697}.   Patient {ACTIONS;DENIES/REPORTS:21021675::"Denies"} nocturnal pain.  Difficulty dressing/grooming: {ACTIONS;DENIES/REPORTS:21021675::"Denies"} Difficulty climbing stairs: {ACTIONS;DENIES/REPORTS:21021675::"Denies"} Difficulty getting out of chair: {ACTIONS;DENIES/REPORTS:21021675::"Denies"} Difficulty using hands for taps, buttons, cutlery, and/or writing: {ACTIONS;DENIES/REPORTS:21021675::"Denies"}  No Rheumatology ROS completed.   PMFS History:  Patient Active Problem List   Diagnosis Date Noted  . Hepatic steatosis 04/02/2018  . Elevated liver function tests 04/02/2018  . Autoimmune disease ([place]) 04/02/2018  . Essential hypertension 03/21/2017  . Hypothyroidism 03/09/2015  . Hyperglycemia 03/09/2015  . Allergy   . Cataract   . Glaucoma   . Hypercholesteremia   . Personal history of kidney stones     Past Medical History:  Diagnosis Date  . Allergy   . Arthritis   . Cancer ([place])    pre cancer cells cervical  . Cataract   . Glaucoma   . History of kidney stones   . Hypercholesteremia   . Hypothyroid   . Hypothyroidism   . Personal history of kidney stones   . Pre-diabetes     Family History  Problem Relation Age of Onset  . Alzheimer's disease Mother   . Cancer Father        Pancreatic Cancer  . Hyperlipidemia Father   . Arthritis Maternal Grandmother   . Diabetes Maternal Grandfather   . Stroke Maternal Grandfather   . Cancer Paternal Grandmother   . Cancer Paternal Grandfather   . Thyroid disease Sister   . Celiac  disease Sister   . Thyroid disease Brother   . Anxiety disorder Son   . Healthy Son   . Healthy Daughter   . Healthy Daughter    Past Surgical History:  Procedure Laterality Date  . CERVICAL BIOPSY  12/07   pre cancerous cells  . COLONOSCOPY    . DACRORHINOCYSTOTOMY Left 08/31/2017   Procedure: DACROCYSTORHINOSTOMY (DCR);  Surgeon: Clista Bernhardt, MD;  Location: [facility];  Service: Ophthalmology;  Laterality: Left;  . ETHMOIDECTOMY Left 08/31/2017   Procedure: ETHMOIDECTOMY, anterior;  Surgeon: Clista Bernhardt, MD;  Location: [place];  Service: Ophthalmology;  Laterality: Left;  . EYE SURGERY Left    tear duct surgery, x4  . LIPOMA EXCISION     on back   . TEAR DUCT PROBING Left 02/20/2017   Procedure: nasolacrimal duct obstruct dacryocystorhinostomy left eye, anterior ephmoidectomy left eye, probe with stent left eye;  Surgeon: Clista Bernhardt, MD;  Location: [facility] [place];  Service: Ophthalmology;  Laterality: Left;  . TONSILLECTOMY    . TUBAL LIGATION  5/86   Social History   Social History Narrative   Does not work outside of home.    Has 2 sets of grandchildren--1 set lives in this area. 1 set lives at [employer].   Approximately 2013-- Was told "PreDiabetic"---She lost 30 pounds. Very careful with diet now. Does not exercise.   Immunization History  Administered Date(s) Administered  . Fluad Quad(high Dose 65+) 01/20/2019  .  Influenza Inj Mdck Quad With Preservative 01/21/2018  . Influenza,inj,Quad PF,6+ Mos 03/09/2015, 03/05/2016, 01/30/2017, 01/02/2018  . Influenza-Unspecified 01/23/2012, 01/29/2013, 02/01/2014, 03/09/2015, 02/22/2016, 01/21/2017  . PFIZER SARS-COV-2 Vaccination 09/08/2019, 09/29/2019  . Pneumococcal Conjugate-13 04/02/2018  . Pneumococcal Polysaccharide-23 07/15/2019  . Td 04/23/2005  . Tdap 01/29/2013  . Zoster 03/09/2015     Objective: Vital Signs: There were no vitals taken for this visit.   Physical Exam   Musculoskeletal Exam: ***  CDAI  Exam: CDAI Score: -- Patient Global: --; Provider Global: -- Swollen: --; Tender: -- Joint Exam 01/18/2020   No joint exam has been documented for this visit   There is currently no information documented on the homunculus. Go to the Rheumatology activity and complete the homunculus joint exam.  Investigation: No additional findings.  Imaging: No results found.  Recent Labs: Lab Results  Component Value Date   WBC 6.4 10/09/2019   HGB 13.8 10/09/2019   PLT 212 10/09/2019   NA 143 10/09/2019   K 4.9 10/09/2019   CL 107 10/09/2019   CO2 31 10/09/2019   GLUCOSE 107 (H) 10/09/2019   BUN 13 10/09/2019   CREATININE 0.92 10/09/2019   BILITOT 1.1 10/09/2019   ALKPHOS 79 09/19/2016   AST 18 10/09/2019   ALT 14 10/09/2019   PROT 6.7 10/09/2019   ALBUMIN 4.3 09/19/2016   CALCIUM 10.2 10/09/2019   GFRAA 75 10/09/2019    Speciality Comments: PLQ eye exam: 03/13/2019 baseline WNL. Follow up in 1 year. (starting PLQ per Dr. Edilia Bo).  Procedures:  No procedures performed Allergies: Penicillins and Alphagan [brimonidine]   Assessment / Plan:     Visit Diagnoses: No diagnosis found.  Orders: No orders of the defined types were placed in this encounter.  No orders of the defined types were placed in this encounter.   Face-to-face time spent with patient was *** minutes. Greater than 50% of time was spent in counseling and coordination of care.  Follow-Up Instructions: No follow-ups on file.   Earnestine Mealing, CMA  Note - This record has been created using Editor, commissioning.  Chart creation errors have been sought, but may not always  have been located. Such creation errors do not reflect on  the standard of medical care.

## 2020-01-11 ENCOUNTER — Other Ambulatory Visit: Payer: Self-pay | Admitting: *Deleted

## 2020-01-11 DIAGNOSIS — Z79899 Other long term (current) drug therapy: Secondary | ICD-10-CM | POA: Diagnosis not present

## 2020-01-12 LAB — COMPLETE METABOLIC PANEL WITH GFR
AG Ratio: 1.8 (calc) (ref 1.0–2.5)
ALT: 16 U/L (ref 6–29)
AST: 21 U/L (ref 10–35)
Albumin: 4.4 g/dL (ref 3.6–5.1)
Alkaline phosphatase (APISO): 61 U/L (ref 37–153)
BUN: 13 mg/dL (ref 7–25)
CO2: 28 mmol/L (ref 20–32)
Calcium: 10 mg/dL (ref 8.6–10.4)
Chloride: 106 mmol/L (ref 98–110)
Creat: 0.9 mg/dL (ref 0.50–0.99)
GFR, Est African American: 77 mL/min/{1.73_m2} (ref >=60)
GFR, Est Non African American: 67 mL/min/{1.73_m2} (ref >=60)
Globulin: 2.5 g/dL (calc) (ref 1.9–3.7)
Glucose, Bld: 92 mg/dL (ref 65–99)
Potassium: 4.3 mmol/L (ref 3.5–5.3)
Sodium: 143 mmol/L (ref 135–146)
Total Bilirubin: 1.4 mg/dL — ABNORMAL HIGH (ref 0.2–1.2)
Total Protein: 6.9 g/dL (ref 6.1–8.1)

## 2020-01-12 LAB — CBC WITH DIFFERENTIAL/PLATELET
Absolute Monocytes: 657 cells/uL (ref 200–950)
Basophils Absolute: 58 cells/uL (ref 0–200)
Basophils Relative: 0.8 %
Eosinophils Absolute: 219 cells/uL (ref 15–500)
Eosinophils Relative: 3 %
HCT: 43.1 % (ref 35.0–45.0)
Hemoglobin: 14.2 g/dL (ref 11.7–15.5)
Lymphs Abs: 1518 cells/uL (ref 850–3900)
MCH: 29.4 pg (ref 27.0–33.0)
MCHC: 32.9 g/dL (ref 32.0–36.0)
MCV: 89.2 fL (ref 80.0–100.0)
MPV: 12.2 fL (ref 7.5–12.5)
Monocytes Relative: 9 %
Neutro Abs: 4847 cells/uL (ref 1500–7800)
Neutrophils Relative %: 66.4 %
Platelets: 208 10*3/uL (ref 140–400)
RBC: 4.83 10*6/uL (ref 3.80–5.10)
RDW: 12.5 % (ref 11.0–15.0)
Total Lymphocyte: 20.8 %
WBC: 7.3 10*3/uL (ref 3.8–10.8)

## 2020-01-12 NOTE — Progress Notes (Signed)
Labs are stable.

## 2020-01-14 NOTE — Progress Notes (Signed)
Office Visit Note  Patient: Kathleen Mcclain             Date of Birth: [birth year]           MRN: [id number]             PCP: Alycia Rossetti, MD Referring: Alycia Rossetti, MD Visit Date: 01/18/2020 Occupation: '@GUAROCC' @  Subjective:  Sicca symptoms   History of Present Illness: Kathleen Mcclain is a 69 y.o. female with history of Sjogren's and osteoarthritis.  Patient is taking Plaquenil 200 mg 1 tablet by mouth twice daily Monday through Friday.  She has been tolerating Plaquenil without any side effects.  She has not missed any doses recently.  She continues to have chronic sicca symptoms.  She has been using refresh eyedrops and has 3 eye specialist that she sees on a regular basis for management of Sjogren's as well as glaucoma.  She denies any recent dental caries.  She sees her dentist every 4 months.  She denies any oral or nasal ulcerations.  She denies any recent rashes.  She denies any enlarged lymph nodes.  She denies any joint pain, joint swelling, or morning stiffness.  She denies any myalgias or muscle tenderness.  She has not had any symptoms of Raynaud's recently.  She denies any chest pain, shortness of breath, or palpitations.   Activities of Daily Living:  Patient reports morning stiffness for 0 minutes.   Patient Denies nocturnal pain.  Difficulty dressing/grooming: Denies Difficulty climbing stairs: Denies Difficulty getting out of chair: Denies Difficulty using hands for taps, buttons, cutlery, and/or writing: Reports  Review of Systems  Constitutional: Negative for fatigue.  HENT: Positive for mouth dryness and nose dryness. Negative for mouth sores.   Eyes: Positive for itching and dryness. Negative for pain and visual disturbance.  Respiratory: Negative for cough, shortness of breath and difficulty breathing.   Cardiovascular: Negative for chest pain, palpitations and swelling in legs/feet.  Gastrointestinal: Negative for abdominal pain, blood in  stool, constipation and diarrhea.  Endocrine: Negative for increased urination.  Genitourinary: Negative for difficulty urinating and painful urination.  Musculoskeletal: Positive for arthralgias and joint pain. Negative for joint swelling, myalgias, muscle weakness, morning stiffness, muscle tenderness and myalgias.  Skin: Negative for color change, rash and redness.  Neurological: Negative for dizziness, headaches, memory loss and weakness.  Hematological: Negative for swollen glands.  Psychiatric/Behavioral: Positive for sleep disturbance. Negative for confusion.    PMFS History:  Patient Active Problem List   Diagnosis Date Noted  . Hepatic steatosis 04/02/2018  . Elevated liver function tests 04/02/2018  . Autoimmune disease ([place]) 04/02/2018  . Essential hypertension 03/21/2017  . Hypothyroidism 03/09/2015  . Hyperglycemia 03/09/2015  . Allergy   . Cataract   . Glaucoma   . Hypercholesteremia   . Personal history of kidney stones     Past Medical History:  Diagnosis Date  . Allergy   . Arthritis   . 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 Cataract   . Glaucoma   . History of kidney stones   . Hypercholesteremia   . Hypothyroid   . Hypothyroidism   . Personal history of kidney stones   . Pre-diabetes     Family History  Problem Relation Age of Onset  . Alzheimer's disease Mother   . Cancer Father        Pancreatic Cancer  . Hyperlipidemia Father   . Arthritis Maternal Grandmother   .  Diabetes Maternal Grandfather   . Stroke Maternal Grandfather   . Cancer Paternal Grandmother   . Cancer Paternal Grandfather   . Thyroid disease Sister   . Celiac disease Sister   . Thyroid disease Brother   . Anxiety disorder Son   . Healthy Son   . Healthy Daughter   . Healthy Daughter    Past Surgical History:  Procedure Laterality Date  . CERVICAL BIOPSY  12/07   pre cancerous cells  . COLONOSCOPY    . DACRORHINOCYSTOTOMY Left 08/31/2017   Procedure:  DACROCYSTORHINOSTOMY (DCR);  Surgeon: Clista Bernhardt, MD;  Location: [facility];  Service: Ophthalmology;  Laterality: Left;  . ETHMOIDECTOMY Left 08/31/2017   Procedure: ETHMOIDECTOMY, anterior;  Surgeon: Clista Bernhardt, MD;  Location: [facility];  Service: Ophthalmology;  Laterality: Left;  . EYE SURGERY Left    tear duct surgery, x4  . LIPOMA EXCISION     on back   . TEAR DUCT PROBING Left 02/20/2017   Procedure: nasolacrimal duct obstruct dacryocystorhinostomy left eye, anterior ephmoidectomy left eye, probe with stent left eye;  Surgeon: Clista Bernhardt, MD;  Location: [facility];  Service: Ophthalmology;  Laterality: Left;  . TONSILLECTOMY    . TUBAL LIGATION  5/86   Social History   Social History Narrative   Does not work outside of home.    Has 2 sets of grandchildren--1 set lives in this area. 1 set lives at [place].   Approximately 2013-- Was told "PreDiabetic"---She lost 30 pounds. Very careful with diet now. Does not exercise.   Immunization History  Administered Date(s) Administered  . Fluad Quad(high Dose 65+) 01/20/2019  . Influenza Inj Mdck Quad With Preservative 01/21/2018  . Influenza,inj,Quad PF,6+ Mos 03/09/2015, 03/05/2016, 01/30/2017, 01/02/2018  . Influenza-Unspecified 01/23/2012, 01/29/2013, 02/01/2014, 03/09/2015, 02/22/2016, 01/21/2017  . PFIZER SARS-COV-2 Vaccination 09/08/2019, 09/29/2019  . Pneumococcal Conjugate-13 04/02/2018  . Pneumococcal Polysaccharide-23 07/15/2019  . Td 04/23/2005  . Tdap 01/29/2013  . Zoster 03/09/2015     Objective: Vital Signs: BP (!) 147/86 (BP Location: Left Arm, Patient Position: Sitting, Cuff Size: Small)   Pulse 77   Ht '5\' 4"'  (1.626 m)   Wt 127 lb 3.7 oz (57.7 kg)   BMI 21.84 kg/m    Physical Exam Vitals and nursing note reviewed.  Constitutional:      Appearance: She is well-developed.  HENT:     Head: Normocephalic and atraumatic.  Eyes:     Conjunctiva/sclera: Conjunctivae normal.  Pulmonary:     Effort:  Pulmonary effort is normal.  Abdominal:     Palpations: Abdomen is soft.  Musculoskeletal:     Cervical back: Normal range of motion.  Skin:    General: Skin is warm and dry.     Capillary Refill: Capillary refill takes less than 2 seconds.  Neurological:     Mental Status: She is alert and oriented to person, place, and time.  Psychiatric:        Behavior: Behavior normal.      Musculoskeletal Exam: C-spine, thoracic spine, and lumbar spine good ROM.  Shoulder joints, elbow joints, wrist joints, MCPs, PIPs good ROM with no synovitis.  Complete fist formation bilaterally. PIP and DIP thickening consistent with osteoarthritis of both hands. Hip joints, knee joints, ankle joints, MTPs, PIPs, and DIPs good ROM with no synovitis.  No warmth or effusion of knee joints.  No tenderness or swelling of ankle joints.  No tenderness of MTP joints.    CDAI Exam: CDAI Score: --  Patient Global: --; Provider Global: -- Swollen: --; Tender: -- Joint Exam 01/18/2020   No joint exam has been documented for this visit   There is currently no information documented on the homunculus. Go to the Rheumatology activity and complete the homunculus joint exam.  Investigation: No additional findings.  Imaging: No results found.  Recent Labs: Lab Results  Component Value Date   WBC 7.3 01/11/2020   HGB 14.2 01/11/2020   PLT 208 01/11/2020   NA 143 01/11/2020   K 4.3 01/11/2020   CL 106 01/11/2020   CO2 28 01/11/2020   GLUCOSE 92 01/11/2020   BUN 13 01/11/2020   CREATININE 0.90 01/11/2020   BILITOT 1.4 (H) 01/11/2020   ALKPHOS 79 09/19/2016   AST 21 01/11/2020   ALT 16 01/11/2020   PROT 6.9 01/11/2020   ALBUMIN 4.3 09/19/2016   CALCIUM 10.0 01/11/2020   GFRAA 77 01/11/2020    Speciality Comments: PLQ eye exam: 03/13/2019 baseline WNL. Follow up in 1 year. (starting PLQ per Dr. Edilia Bo).  Procedures:  No procedures performed Allergies: Penicillins and Alphagan [brimonidine]   Assessment  / Plan:     Visit Diagnoses: Sjogren's syndrome with keratoconjunctivitis sicca ([place]): +ANA and sicca symptoms: She has chronic sicca symptoms, which are overall tolerable with OTC products and PLQ use.  She has been using refresh drops daily, and she has 3 eye specialists who she sees on a regular basis for glaucoma and eye dryness. She also sees her dentist every 4 months and uses products recommended by dental hygienist.  She has not had any recent dental caries.  She has not noticed any cervical lymphadenopathy, shortness of breath, or signs of inflammatory arthritis.  No synovitis or joint tenderness was noted on exam. She is clinically doing well on Plaquenil 200 mg 1 tablet by mouth twice daily Monday through Friday. She is tolerating PLQ without any side effects and has not missed any doses recently. She was advised to notify us if she develops any new or worsening symptoms.  We will check CBC, CMP, UA, SPEP, RF, ANA, ESR, dsDNA, and complements at her follow up visit. Future orders were placed today. She will follow up in the office in 5 months.   Positive ANA (antinuclear antibody) - ANA 1: 640H, dsDNA >1000, and positive antithyroid peroxidase IgG, hx of fatigue, sicca sx, hair loss: She has no other clinical features of autoimmune disease at this time.  Her sicca symptoms have been tolerable.  She has not developed any other new or worsening symptoms.  She will continue taking PLQ as prescribed.   High risk medication use - Plaquenil 200 mg 1 tablet by mouth twice daily Monday to Friday.  PLQ eye exam: 03/13/2019.    Positive double stranded DNA antibody test: AVISE dsDNA >1000.  She is no clinical features of systemic lupus at this time.   Primary osteoarthritis of both hands: She has PIP and DIP thickening consistent with osteoarthritis of both hands.  No synovitis or joint tenderness was noted.  She is able to make a complete fist bilaterally.  Joint protection and muscle strengthening were  discussed.   Other fatigue: She is not experiencing any fatigue at this time.   History of glaucoma: She sees her three eye specialists on a regular basis. She uses timolol drops 2 times daily.     Other medical conditions are listed as follows:   Essential hypertension  Hypercholesteremia  History of hypothyroidism  Orders: Orders Placed This Encounter  Procedures  . Anti-DNA antibody, double-stranded  . C3 and C4  . Sedimentation rate  . Urinalysis, Routine w reflex microscopic  . ANA  . COMPLETE METABOLIC PANEL WITH GFR  . CBC with Differential/Platelet  . Serum protein electrophoresis with reflex  . Rheumatoid factor   No orders of the defined types were placed in this encounter.    Follow-Up Instructions: Return in about 5 months (around 06/19/2020) for Sjogren's syndrome, Osteoarthritis.   Ofilia Neas, PA-C  Note - This record has been created using Dragon software.  Chart creation errors have been sought, but may not always  have been located. Such creation errors do not reflect on  the standard of medical care.

## 2020-01-15 ENCOUNTER — Ambulatory Visit: Payer: Medicare Other | Admitting: Family Medicine

## 2020-01-18 ENCOUNTER — Ambulatory Visit: Payer: Medicare Other | Admitting: Physician Assistant

## 2020-01-18 ENCOUNTER — Other Ambulatory Visit: Payer: Self-pay

## 2020-01-18 ENCOUNTER — Ambulatory Visit (INDEPENDENT_AMBULATORY_CARE_PROVIDER_SITE_OTHER): Payer: Medicare Other | Admitting: Physician Assistant

## 2020-01-18 ENCOUNTER — Encounter: Payer: Self-pay | Admitting: Physician Assistant

## 2020-01-18 VITALS — BP 147/86 | HR 77 | Ht 64.0 in | Wt 127.2 lb

## 2020-01-18 DIAGNOSIS — Z8669 Personal history of other diseases of the nervous system and sense organs: Secondary | ICD-10-CM | POA: Diagnosis not present

## 2020-01-18 DIAGNOSIS — R768 Other specified abnormal immunological findings in serum: Secondary | ICD-10-CM

## 2020-01-18 DIAGNOSIS — Z8639 Personal history of other endocrine, nutritional and metabolic disease: Secondary | ICD-10-CM | POA: Diagnosis not present

## 2020-01-18 DIAGNOSIS — R5383 Other fatigue: Secondary | ICD-10-CM

## 2020-01-18 DIAGNOSIS — M19041 Primary osteoarthritis, right hand: Secondary | ICD-10-CM

## 2020-01-18 DIAGNOSIS — Z79899 Other long term (current) drug therapy: Secondary | ICD-10-CM | POA: Diagnosis not present

## 2020-01-18 DIAGNOSIS — M3501 Sicca syndrome with keratoconjunctivitis: Secondary | ICD-10-CM | POA: Diagnosis not present

## 2020-01-18 DIAGNOSIS — M19042 Primary osteoarthritis, left hand: Secondary | ICD-10-CM

## 2020-01-18 DIAGNOSIS — E7800 Pure hypercholesterolemia, unspecified: Secondary | ICD-10-CM | POA: Diagnosis not present

## 2020-01-18 DIAGNOSIS — I10 Essential (primary) hypertension: Secondary | ICD-10-CM

## 2020-01-18 NOTE — Patient Instructions (Addendum)
COVID-19 vaccine recommendations:   COVID-19 vaccine is recommended for everyone (unless you are allergic to a vaccine component), even if you are on a medication that suppresses your immune system.   If you are on Methotrexate, Cellcept (mycophenolate), Rinvoq, Morrie Sheldon, and Olumiant- hold the medication for 1 week after each vaccine. Hold Methotrexate for 2 weeks after the single dose COVID-19 vaccine.   If you are on Orencia subcutaneous injection - hold medication one week prior to and one week after the first COVID-19 vaccine dose (only).   If you are on Orencia IV infusions- time vaccination administration so that the first COVID-19 vaccination will occur four weeks after the infusion and postpone the subsequent infusion by one week.   If you are on Cyclophosphamide or Rituxan infusions please contact your doctor prior to receiving the COVID-19 vaccine.   Do not take Tylenol or any anti-inflammatory medications (NSAIDs) 24 hours prior to the COVID-19 vaccination.   There is no direct evidence about the efficacy of the COVID-19 vaccine in individuals who are on medications that suppress the immune system.   Even if you are fully vaccinated, and you are on any medications that suppress your immune system, please continue to wear a mask, maintain at least six feet social distance and practice hand hygiene.   If you develop a COVID-19 infection, please contact your PCP or our office to determine if you need antibody infusion.  The booster vaccine is now available for immunocompromised patients. It is advised that if you had Pfizer vaccine you should get Coca[employer]a booster.  If you had a Moderna vaccine then you should get a Moderna booster. Johnson and Wynetta Emery does not have a booster vaccine at this time.  Please see the following web sites for updated information.    https://[email or web address].org/[email or web address]/0/Files/COVID-19-Vaccination-Patient-Resources.pdf  [email or web address]://[email or web address]/About-Us/[email or web address]-Releases/ID/1159  Standing Labs We placed an order today for your standing lab work.   Please have your standing labs drawn in 5 months   If possible, please have your labs drawn 2 weeks prior to your appointment so that the provider can discuss your results at your appointment.  We have open lab daily Monday through Thursday from 8:30-12:30 PM and 1:30-4:30 PM and Friday from 8:30-12:30 PM and 1:30-4:00 PM at the office of Dr. Bo Merino, [facility].   Please be advised, patients with office appointments requiring lab work will take precedents over walk-in lab work.  If possible, please come for your lab work on Monday and Friday afternoons, as you may experience shorter wait times. The office is located at [address], [place], [place], [place] [address] No appointment is necessary.   Labs are drawn by [facility]. Please bring your co-pay at the time of your lab draw.  You may receive a bill from [facility] for your lab work.  If you wish to have your labs drawn at another location, please call the office 24 hours in advance to send orders.  If you have any questions regarding directions or hours of operation,  please call [phone].   As a reminder, please drink plenty of water prior to coming for your lab work. Thanks!

## 2020-01-19 ENCOUNTER — Ambulatory Visit (INDEPENDENT_AMBULATORY_CARE_PROVIDER_SITE_OTHER): Payer: Medicare Other | Admitting: Family Medicine

## 2020-01-19 ENCOUNTER — Encounter: Payer: Self-pay | Admitting: Family Medicine

## 2020-01-19 ENCOUNTER — Other Ambulatory Visit: Payer: Self-pay

## 2020-01-19 VITALS — BP 130/90 | HR 74 | Temp 98.5°F | Ht 64.0 in | Wt 127.0 lb

## 2020-01-19 DIAGNOSIS — I10 Essential (primary) hypertension: Secondary | ICD-10-CM | POA: Diagnosis not present

## 2020-01-19 DIAGNOSIS — N951 Menopausal and female climacteric states: Secondary | ICD-10-CM

## 2020-01-19 DIAGNOSIS — E039 Hypothyroidism, unspecified: Secondary | ICD-10-CM

## 2020-01-19 DIAGNOSIS — K760 Fatty (change of) liver, not elsewhere classified: Secondary | ICD-10-CM | POA: Diagnosis not present

## 2020-01-19 DIAGNOSIS — Z23 Encounter for immunization: Secondary | ICD-10-CM

## 2020-01-19 DIAGNOSIS — E7800 Pure hypercholesterolemia, unspecified: Secondary | ICD-10-CM | POA: Diagnosis not present

## 2020-01-19 NOTE — Patient Instructions (Signed)
Flu shot given We will call with lab results Schedule your bone density  F/U 6 MONTHS

## 2020-01-19 NOTE — Assessment & Plan Note (Signed)
CMET performed by her rhuematologist yesterday Check lipds

## 2020-01-19 NOTE — Assessment & Plan Note (Signed)
Pt  Fasting, Bp typically well controlled Diastolic was mildly elevated today

## 2020-01-19 NOTE — Assessment & Plan Note (Signed)
Check TFT

## 2020-01-19 NOTE — Progress Notes (Signed)
   Subjective:    Patient ID: Kathleen Mcclain, female    DOB: 1950/05/14, 69 y.o.   MRN: [id number]  Patient presents for Follow-up (6 months)   Pt here to f/u chronic medical problems   Hypothyroidism- she is on thyrid replacement, taking 41mcg M.W.F and 50mg   T/TH/SAT, SUN  Hyperliidemia- taking lipitor, lasat LDL in March 88  HTN- taking lisinopril 5mg  once a day   COVID-19 vaccine UTD  She is continued on plaquenil for her Sjrogens ., she saw Dr. Dr. Bronson Curb   she is followed by [facility] and doing well   No new concerns today      Review Of Systems:  GEN- denies fatigue, fever, weight loss,weakness, recent illness HEENT- denies eye drainage, change in vision, nasal discharge, CVS- denies chest pain, palpitations RESP- denies SOB, cough, wheeze ABD- denies N/V, change in stools, abd pain GU- denies dysuria, hematuria, dribbling, incontinence MSK- denies joint pain, muscle aches, injury Neuro- denies headache, dizziness, syncope, seizure activity       Objective:    BP 130/90   Pulse 74   Temp 98.5 F (36.9 C) (Oral)   Ht 5\' 4"  (1.626 m)   Wt 127 lb (57.6 kg)   SpO2 99%   BMI 21.80 kg/m  GEN- NAD, alert and oriented x3 HEENT- PERRL, EOMI, non injected sclera, pink conjunctiva, MMM, oropharynx clear Neck- Supple, no thyromegaly CVS- RRR, no murmur RESP-CTAB ABD-NABS,soft,NT,ND EXT- No edema Pulses- Radial, DP- 2+        Assessment & Plan:      Problem List Items Addressed This Visit      Unprioritized   Essential hypertension - Primary    Pt  Fasting, Bp typically well controlled Diastolic was mildly elevated today       Relevant Orders   Lipid panel   Hepatic steatosis    CMET performed by her rhuematologist yesterday Check lipds      Relevant Orders   Lipid panel   Hypercholesteremia   Relevant Orders   Lipid panel   Hypothyroidism    Check TFT       Relevant Orders   TSH   T3, free    Other Visit  Diagnoses    Post menopausal syndrome       Due for Bone density    Relevant Orders   DG Bone Density   Need for immunization against influenza       Relevant Orders   Flu Vaccine QUAD High Dose(Fluad) (Completed)      Note: This dictation was prepared with Dragon dictation along with smaller phrase technology. Any transcriptional errors that result from this process are unintentional.

## 2020-01-20 LAB — TSH: TSH: 3.28 mIU/L (ref 0.40–4.50)

## 2020-01-20 LAB — LIPID PANEL
Cholesterol: 171 mg/dL (ref <200)
HDL: 69 mg/dL (ref >=50)
LDL Cholesterol (Calc): 85 mg/dL (calc)
Non-HDL Cholesterol (Calc): 102 mg/dL (calc) (ref <130)
Total CHOL/HDL Ratio: 2.5 (calc) (ref <5.0)
Triglycerides: 81 mg/dL (ref <150)

## 2020-01-20 LAB — T3, FREE: T3, Free: 3.2 pg/mL (ref 2.3–4.2)

## 2020-01-21 ENCOUNTER — Encounter: Payer: Self-pay | Admitting: *Deleted

## 2020-02-20 ENCOUNTER — Other Ambulatory Visit: Payer: Self-pay | Admitting: Rheumatology

## 2020-02-22 NOTE — Telephone Encounter (Signed)
Last Visit: 01/18/2020 Next Visit: 06/20/2020 Labs: 01/11/2020 stable Eye exam: 03/13/2019 baseline WNL  Current Dose per office note 01/18/2020:Plaquenil 200 mg 1 tablet by mouth twice daily Monday to Friday IE:[id number] syndrome with keratoconjunctivitis sicca   Okay to refill per Dr. Estanislado Pandy

## 2020-03-08 DIAGNOSIS — H10413 Chronic giant papillary conjunctivitis, bilateral: Secondary | ICD-10-CM | POA: Diagnosis not present

## 2020-03-08 DIAGNOSIS — H16223 Keratoconjunctivitis sicca, not specified as Sjogren's, bilateral: Secondary | ICD-10-CM | POA: Diagnosis not present

## 2020-03-08 DIAGNOSIS — H04123 Dry eye syndrome of bilateral lacrimal glands: Secondary | ICD-10-CM | POA: Diagnosis not present

## 2020-03-08 DIAGNOSIS — H401132 Primary open-angle glaucoma, bilateral, moderate stage: Secondary | ICD-10-CM | POA: Diagnosis not present

## 2020-03-08 DIAGNOSIS — H524 Presbyopia: Secondary | ICD-10-CM | POA: Diagnosis not present

## 2020-03-08 DIAGNOSIS — H52223 Regular astigmatism, bilateral: Secondary | ICD-10-CM | POA: Diagnosis not present

## 2020-03-08 DIAGNOSIS — H5213 Myopia, bilateral: Secondary | ICD-10-CM | POA: Diagnosis not present

## 2020-03-25 ENCOUNTER — Other Ambulatory Visit: Payer: Self-pay | Admitting: Family Medicine

## 2020-03-28 DIAGNOSIS — H401132 Primary open-angle glaucoma, bilateral, moderate stage: Secondary | ICD-10-CM | POA: Diagnosis not present

## 2020-03-29 DIAGNOSIS — H401132 Primary open-angle glaucoma, bilateral, moderate stage: Secondary | ICD-10-CM | POA: Diagnosis not present

## 2020-03-29 DIAGNOSIS — Z79899 Other long term (current) drug therapy: Secondary | ICD-10-CM | POA: Diagnosis not present

## 2020-03-29 DIAGNOSIS — M3500 Sicca syndrome, unspecified: Secondary | ICD-10-CM | POA: Diagnosis not present

## 2020-03-29 DIAGNOSIS — H2513 Age-related nuclear cataract, bilateral: Secondary | ICD-10-CM | POA: Diagnosis not present

## 2020-04-27 ENCOUNTER — Other Ambulatory Visit: Payer: Self-pay | Admitting: Family Medicine

## 2020-05-13 ENCOUNTER — Other Ambulatory Visit: Payer: Self-pay | Admitting: Family Medicine

## 2020-05-13 DIAGNOSIS — E2839 Other primary ovarian failure: Secondary | ICD-10-CM

## 2020-05-16 ENCOUNTER — Other Ambulatory Visit: Payer: Self-pay | Admitting: Rheumatology

## 2020-05-16 MED ORDER — HYDROXYCHLOROQUINE SULFATE 200 MG PO TABS: ORAL_TABLET | ORAL | 2 refills | Status: DC

## 2020-05-16 NOTE — Addendum Note (Signed)
Addended by: Carole Binning on: 05/16/2020 01:36 PM   Modules accepted: Orders

## 2020-05-16 NOTE — Telephone Encounter (Signed)
Last Visit: 01/18/2020 Next Visit: 2/282022 Labs: 01/11/2020, Labs are stable Eye exam: 03/29/2020 WNL  Current Dose per office note 01/18/2020,  Plaquenil 200 mg 1 tablet by mouth twice daily Monday through Friday UU:[id number] syndrome with keratoconjunctivitis sicca   Okay to refill Plaquenil?

## 2020-05-16 NOTE — Addendum Note (Signed)
Addended by: Carole Binning on: 05/16/2020 02:51 PM   Modules accepted: Orders

## 2020-05-18 ENCOUNTER — Other Ambulatory Visit: Payer: Medicare Other

## 2020-06-06 NOTE — Progress Notes (Signed)
Office Visit Note  Patient: Kathleen Mcclain             Date of Birth: [birth year]           MRN: [id number]             PCP: Alycia Rossetti, MD Referring: Alycia Rossetti, MD Visit Date: 06/20/2020 Occupation: @GUAROCC @  Subjective:  Other (Occasional right hand pain )   History of Present Illness: Kathleen Mcclain is a 70 y.o. female with history of Sjogren's syndrome.  She states she continues to have sicca symptoms and she has been using over-the-counter products which are helping her.  She has occasional discomfort in her hands and feet but she has not noticed any joint swelling.  She denies any history of Raynaud's phenomenon, rash, photosensitivity.  She denies any lymphadenopathy.  She has been under a lot of stress due to her family situation.  She states she does not have time to exercise and do routine activities.  Her blood pressure has been elevated and also she has lost some hair.  Activities of Daily Living:  Patient reports morning stiffness for 0 minutes.   Patient Denies nocturnal pain.  Difficulty dressing/grooming: Denies Difficulty climbing stairs: Denies Difficulty getting out of chair: Denies Difficulty using hands for taps, buttons, cutlery, and/or writing: Reports  Review of Systems  Constitutional: Positive for fatigue.  HENT: Positive for mouth dryness and nose dryness. Negative for mouth sores.   Eyes: Positive for dryness. Negative for pain and itching.  Respiratory: Negative for shortness of breath and difficulty breathing.   Cardiovascular: Positive for palpitations. Negative for chest pain.  Gastrointestinal: Negative for blood in stool, constipation and diarrhea.  Endocrine: Negative for increased urination.  Genitourinary: Negative for difficulty urinating.  Musculoskeletal: Negative for arthralgias, joint pain, joint swelling, myalgias, morning stiffness, muscle tenderness and myalgias.  Skin: Positive for hair loss. Negative for color  change, rash and redness.  Allergic/Immunologic: Negative for susceptible to infections.  Neurological: Negative for dizziness, numbness, headaches, memory loss and weakness.  Hematological: Positive for bruising/bleeding tendency.  Psychiatric/Behavioral: Negative for depressed mood and confusion. The patient is nervous/anxious.     PMFS History:  Patient Active Problem List   Diagnosis Date Noted  . Sjogren's syndrome with keratoconjunctivitis sicca ([place]) 06/20/2020  . Hepatic steatosis 04/02/2018  . Elevated liver function tests 04/02/2018  . Autoimmune disease ([place]) 04/02/2018  . Essential hypertension 03/21/2017  . Hypothyroidism 03/09/2015  . Hyperglycemia 03/09/2015  . Allergy   . Cataract   . Glaucoma   . Hypercholesteremia   . Personal history of kidney stones     Past Medical History:  Diagnosis Date  . Allergy   . Arthritis   . 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 Cataract   . Glaucoma   . History of kidney stones   . Hypercholesteremia   . Hypothyroid   . Hypothyroidism   . Personal history of kidney stones   . Pre-diabetes     Family History  Problem Relation Age of Onset  . Alzheimer's disease Mother   . Cancer Father        Pancreatic Cancer  . Hyperlipidemia Father   . Arthritis Maternal Grandmother   . Diabetes Maternal Grandfather   . Stroke Maternal Grandfather   . Cancer Paternal Grandmother   . Cancer Paternal Grandfather   . Thyroid disease Sister   . Celiac disease Sister   . Thyroid disease Brother   .  Anxiety disorder Son   . Healthy Son   . Healthy Daughter   . Healthy Daughter    Past Surgical History:  Procedure Laterality Date  . CERVICAL BIOPSY  12/07   pre cancerous cells  . COLONOSCOPY    . DACRORHINOCYSTOTOMY Left 08/31/2017   Procedure: DACROCYSTORHINOSTOMY (DCR);  Surgeon: Clista Bernhardt, MD;  Location: [facility];  Service: Ophthalmology;  Laterality: Left;  . ETHMOIDECTOMY Left 08/31/2017   Procedure:  ETHMOIDECTOMY, anterior;  Surgeon: Clista Bernhardt, MD;  Location: [facility];  Service: Ophthalmology;  Laterality: Left;  . EYE SURGERY Left    tear duct surgery, x4  . LIPOMA EXCISION     on back   . TEAR DUCT PROBING Left 02/20/2017   Procedure: nasolacrimal duct obstruct dacryocystorhinostomy left eye, anterior ephmoidectomy left eye, probe with stent left eye;  Surgeon: Clista Bernhardt, MD;  Location: [facility];  Service: Ophthalmology;  Laterality: Left;  . TONSILLECTOMY    . TUBAL LIGATION  5/86   Social History   Social History Narrative   Does not work outside of home.    Has 2 sets of grandchildren--1 set lives in this area. 1 set lives at [place] [facility].   Approximately 2013-- Was told "PreDiabetic"---She lost 30 pounds. Very careful with diet now. Does not exercise.   Immunization History  Administered Date(s) Administered  . Fluad Quad(high Dose 65+) 01/20/2019, 01/19/2020  . Influenza Inj Mdck Quad With Preservative 01/21/2018  . Influenza,inj,Quad PF,6+ Mos 03/09/2015, 03/05/2016, 01/30/2017, 01/02/2018  . Influenza-Unspecified 01/23/2012, 01/29/2013, 02/01/2014, 03/09/2015, 02/22/2016, 01/21/2017  . PFIZER(Purple Top)SARS-COV-2 Vaccination 09/08/2019, 09/29/2019  . Pneumococcal Conjugate-13 04/02/2018  . Pneumococcal Polysaccharide-23 07/15/2019  . Td 04/23/2005  . Tdap 01/29/2013  . Zoster 03/09/2015     Objective: Vital Signs: BP (!) 179/83 (BP Location: Left Arm, Patient Position: Sitting, Cuff Size: Normal)   Pulse 91   Resp 13   Ht 5\' 4"  (1.626 m)   Wt 129 lb 12.8 oz (58.9 kg)   BMI 22.28 kg/m    Physical Exam Vitals and nursing note reviewed.  Constitutional:      Appearance: She is well-developed and well-nourished.  HENT:     Head: Normocephalic and atraumatic.  Eyes:     Extraocular Movements: EOM normal.     Conjunctiva/sclera: Conjunctivae normal.  Cardiovascular:     Rate and Rhythm: Normal rate and regular rhythm.     Pulses: Intact distal  pulses.     Heart sounds: Normal heart sounds.  Pulmonary:     Effort: Pulmonary effort is normal.     Breath sounds: Normal breath sounds.  Abdominal:     General: Bowel sounds are normal.     Palpations: Abdomen is soft.  Musculoskeletal:     Cervical back: Normal range of motion.  Lymphadenopathy:     Cervical: No cervical adenopathy.  Skin:    General: Skin is warm and dry.     Capillary Refill: Capillary refill takes less than 2 seconds.  Neurological:     Mental Status: She is alert and oriented to person, place, and time.  Psychiatric:        Mood and Affect: Mood and affect normal.        Behavior: Behavior normal.      Musculoskeletal Exam: C-spine thoracic and lumbar spine with good range of motion.  Shoulder joints, elbow joints, wrist joints, MCPs PIPs and DIPs with good range of motion with no synovitis.  Hip joints, knee joints, ankles,  MTPs and PIPs with good range of motion with no synovitis.  CDAI Exam: CDAI Score: - Patient Global: -; Provider Global: - Swollen: -; Tender: - Joint Exam 06/20/2020   No joint exam has been documented for this visit   There is currently no information documented on the homunculus. Go to the Rheumatology activity and complete the homunculus joint exam.  Investigation: No additional findings.  Imaging: No results found.  Recent Labs: Lab Results  Component Value Date   WBC 6.2 06/10/2020   HGB 14.1 06/10/2020   PLT 211 06/10/2020   NA 144 06/10/2020   K 4.8 06/10/2020   CL 106 06/10/2020   CO2 30 06/10/2020   GLUCOSE 89 06/10/2020   BUN 16 06/10/2020   CREATININE 0.95 06/10/2020   BILITOT 1.2 06/10/2020   ALKPHOS 79 09/19/2016   AST 20 06/10/2020   ALT 17 06/10/2020   PROT 6.9 06/10/2020   PROT 6.9 06/10/2020   ALBUMIN 4.3 09/19/2016   CALCIUM 10.1 06/10/2020   GFRAA 72 06/10/2020    Speciality Comments: PLQ eye exam: 03/29/2020 WNL. Follow up in 1 year. (starting PLQ per Dr. Edilia Bo).  Procedures:  No  procedures performed Allergies: Penicillins and Alphagan [brimonidine]   Assessment / Plan:     Visit Diagnoses: Sjogren's syndrome with keratoconjunctivitis sicca ([facility]) - +ANA, negative Ro, negative La antibodies and sicca symptoms:  -She has Sjogren's syndrome with sicca symptoms.  She has been using over-the-counter products.  Her eyes are still dry.  Over-the-counter products were discussed again.  I will obtain antibody levels again with the next labs.  Plan: Anti-DNA antibody, double-stranded, C3 and C4, Sedimentation rate, Sjogrens syndrome-A extractable nuclear antibody before the next visit.  I have advised her to get a baseline EKG.  Positive ANA (antinuclear antibody) - ANA 1: 640H, dsDNA >1000, and positive antithyroid peroxidase IgG, hx of fatigue, sicca sx, hair loss:  High risk medication use - Plaquenil 200 mg 1 tablet by mouth twice daily Monday to Friday.  PLQ eye exam: 03/29/2020 - Plan: CBC with Differential/Platelet, COMPLETE METABOLIC PANEL WITH GFR before the next visit.  Positive double stranded DNA antibody test - AVISE dsDNA >1000.  She is no clinical features of systemic lupus at this time.  Her double-stranded DNA done this month was negative.  I will repeat that again at next month.  It is surprising to see that she had such a high titer before.  Primary osteoarthritis of both hands-she had some chronic discomfort but no synovitis was noted.  Other fatigue-she has been under a lot of stress at home.  Essential hypertension-blood pressure reading was elevated today.  Have advised her to schedule an appointment with Dr. Buelah Manis for management of hypertension.  Low-salt diet was discussed.  Have given her information for dietary modification.  Hypercholesteremia-dietary modifications were discussed.   History of hypothyroidism  History of glaucoma  Stress at home-she has been under a lot of stress.  Have advised her to discuss this further with her PCP.  She may need a  medication for stress management.  Need for regular exercise was also emphasized.  Educated about COVID-19 virus infection-patient had initial to COVID-19 vaccines.  Have advised her to get booster.  Use of mask, social distancing and hand hygiene was discussed.  Orders: Orders Placed This Encounter  Procedures  . CBC with Differential/Platelet  . Anti-DNA antibody, double-stranded  . C3 and C4  . Sedimentation rate  . Sjogrens syndrome-A extractable nuclear antibody  .  COMPLETE METABOLIC PANEL WITH GFR   No orders of the defined types were placed in this encounter.   Follow-Up Instructions: Return in about 5 months (around 11/17/2020) for Sjogren's.   Bo Merino, MD  Note - This record has been created using Editor, commissioning.  Chart creation errors have been sought, but may not always  have been located. Such creation errors do not reflect on  the standard of medical care.

## 2020-06-10 ENCOUNTER — Other Ambulatory Visit: Payer: Self-pay | Admitting: *Deleted

## 2020-06-10 DIAGNOSIS — Z79899 Other long term (current) drug therapy: Secondary | ICD-10-CM

## 2020-06-10 DIAGNOSIS — M3501 Sicca syndrome with keratoconjunctivitis: Secondary | ICD-10-CM

## 2020-06-10 DIAGNOSIS — R768 Other specified abnormal immunological findings in serum: Secondary | ICD-10-CM

## 2020-06-13 NOTE — Progress Notes (Signed)
UA revealed trace leukocytes.  Negative for nitrites and bacteria. RF negative.  CBC and CMP WNL. ESR WNL. Complements WNL.

## 2020-06-14 LAB — COMPLETE METABOLIC PANEL WITH GFR
AG Ratio: 1.9 (calc) (ref 1.0–2.5)
ALT: 17 U/L (ref 6–29)
AST: 20 U/L (ref 10–35)
Albumin: 4.5 g/dL (ref 3.6–5.1)
Alkaline phosphatase (APISO): 66 U/L (ref 37–153)
BUN: 16 mg/dL (ref 7–25)
CO2: 30 mmol/L (ref 20–32)
Calcium: 10.1 mg/dL (ref 8.6–10.4)
Chloride: 106 mmol/L (ref 98–110)
Creat: 0.95 mg/dL (ref 0.50–0.99)
GFR, Est African American: 72 mL/min/{1.73_m2} (ref >=60)
GFR, Est Non African American: 62 mL/min/{1.73_m2} (ref >=60)
Globulin: 2.4 g/dL (calc) (ref 1.9–3.7)
Glucose, Bld: 89 mg/dL (ref 65–99)
Potassium: 4.8 mmol/L (ref 3.5–5.3)
Sodium: 144 mmol/L (ref 135–146)
Total Bilirubin: 1.2 mg/dL (ref 0.2–1.2)
Total Protein: 6.9 g/dL (ref 6.1–8.1)

## 2020-06-14 LAB — URINALYSIS, ROUTINE W REFLEX MICROSCOPIC
Bacteria, UA: NONE SEEN /HPF
Bilirubin Urine: NEGATIVE
Glucose, UA: NEGATIVE
Hgb urine dipstick: NEGATIVE
Hyaline Cast: NONE SEEN /LPF
Ketones, ur: NEGATIVE
Nitrite: NEGATIVE
Protein, ur: NEGATIVE
RBC / HPF: NONE SEEN /HPF (ref 0–2)
Specific Gravity, Urine: 1.008 (ref 1.001–1.03)
Squamous Epithelial / HPF: NONE SEEN /HPF (ref <=5)
WBC, UA: NONE SEEN /HPF (ref 0–5)
pH: 7 (ref 5.0–8.0)

## 2020-06-14 LAB — PROTEIN ELECTROPHORESIS, SERUM, WITH REFLEX
Albumin ELP: 4.3 g/dL (ref 3.8–4.8)
Alpha 1: 0.2 g/dL (ref 0.2–0.3)
Alpha 2: 0.7 g/dL (ref 0.5–0.9)
Beta 2: 0.4 g/dL (ref 0.2–0.5)
Beta Globulin: 0.4 g/dL (ref 0.4–0.6)
Gamma Globulin: 0.9 g/dL (ref 0.8–1.7)
Total Protein: 6.9 g/dL (ref 6.1–8.1)

## 2020-06-14 LAB — ANA: Anti Nuclear Antibody (ANA): POSITIVE — AB

## 2020-06-14 LAB — CBC WITH DIFFERENTIAL/PLATELET
Absolute Monocytes: 589 cells/uL (ref 200–950)
Basophils Absolute: 50 cells/uL (ref 0–200)
Basophils Relative: 0.8 %
Eosinophils Absolute: 217 cells/uL (ref 15–500)
Eosinophils Relative: 3.5 %
HCT: 42.9 % (ref 35.0–45.0)
Hemoglobin: 14.1 g/dL (ref 11.7–15.5)
Lymphs Abs: 1488 cells/uL (ref 850–3900)
MCH: 29.5 pg (ref 27.0–33.0)
MCHC: 32.9 g/dL (ref 32.0–36.0)
MCV: 89.7 fL (ref 80.0–100.0)
MPV: 12.3 fL (ref 7.5–12.5)
Monocytes Relative: 9.5 %
Neutro Abs: 3856 cells/uL (ref 1500–7800)
Neutrophils Relative %: 62.2 %
Platelets: 211 10*3/uL (ref 140–400)
RBC: 4.78 10*6/uL (ref 3.80–5.10)
RDW: 12.4 % (ref 11.0–15.0)
Total Lymphocyte: 24 %
WBC: 6.2 10*3/uL (ref 3.8–10.8)

## 2020-06-14 LAB — ANTI-NUCLEAR AB-TITER (ANA TITER): ANA Titer 1: 1:320 {titer} — ABNORMAL HIGH

## 2020-06-14 LAB — C3 AND C4
C3 Complement: 141 mg/dL (ref 83–193)
C4 Complement: 26 mg/dL (ref 15–57)

## 2020-06-14 LAB — SEDIMENTATION RATE: Sed Rate: 2 mm/h (ref 0–30)

## 2020-06-14 LAB — RHEUMATOID FACTOR: Rheumatoid fact SerPl-aCnc: <14 IU/mL (ref <14)

## 2020-06-14 LAB — ANTI-DNA ANTIBODY, DOUBLE-STRANDED: ds DNA Ab: 1 IU/mL

## 2020-06-14 NOTE — Progress Notes (Signed)
ANA remains positive but the titer is lower: 1:320.  dsDNA is negative. SPEP did not reveal any monoclonal proteins.

## 2020-06-20 ENCOUNTER — Other Ambulatory Visit: Payer: Self-pay

## 2020-06-20 ENCOUNTER — Ambulatory Visit (INDEPENDENT_AMBULATORY_CARE_PROVIDER_SITE_OTHER): Payer: Medicare Other | Admitting: Rheumatology

## 2020-06-20 ENCOUNTER — Encounter: Payer: Self-pay | Admitting: Rheumatology

## 2020-06-20 VITALS — BP 179/83 | HR 91 | Resp 13 | Ht 64.0 in | Wt 129.8 lb

## 2020-06-20 DIAGNOSIS — R5383 Other fatigue: Secondary | ICD-10-CM | POA: Diagnosis not present

## 2020-06-20 DIAGNOSIS — M19041 Primary osteoarthritis, right hand: Secondary | ICD-10-CM | POA: Diagnosis not present

## 2020-06-20 DIAGNOSIS — Z8639 Personal history of other endocrine, nutritional and metabolic disease: Secondary | ICD-10-CM | POA: Diagnosis not present

## 2020-06-20 DIAGNOSIS — Z79899 Other long term (current) drug therapy: Secondary | ICD-10-CM

## 2020-06-20 DIAGNOSIS — Z7189 Other specified counseling: Secondary | ICD-10-CM

## 2020-06-20 DIAGNOSIS — F439 Reaction to severe stress, unspecified: Secondary | ICD-10-CM | POA: Diagnosis not present

## 2020-06-20 DIAGNOSIS — I10 Essential (primary) hypertension: Secondary | ICD-10-CM | POA: Diagnosis not present

## 2020-06-20 DIAGNOSIS — E7800 Pure hypercholesterolemia, unspecified: Secondary | ICD-10-CM

## 2020-06-20 DIAGNOSIS — M19042 Primary osteoarthritis, left hand: Secondary | ICD-10-CM | POA: Diagnosis not present

## 2020-06-20 DIAGNOSIS — Z8669 Personal history of other diseases of the nervous system and sense organs: Secondary | ICD-10-CM | POA: Diagnosis not present

## 2020-06-20 DIAGNOSIS — R768 Other specified abnormal immunological findings in serum: Secondary | ICD-10-CM

## 2020-06-20 DIAGNOSIS — M3501 Sicca syndrome with keratoconjunctivitis: Secondary | ICD-10-CM

## 2020-06-20 NOTE — Patient Instructions (Addendum)
Standing Labs We placed an order today for your standing lab work.   Please have your standing labs drawn in July  If possible, please have your labs drawn 2 weeks prior to your appointment so that the provider can discuss your results at your appointment.  We have open lab daily Monday through Thursday from 1:30-4:30 PM and Friday from 1:30-4:00 PM at the office of Dr. Bo Merino, [facility].   Please be advised, all patients with office appointments requiring lab work will take precedents over walk-in lab work.  If possible, please come for your lab work on Monday and Friday afternoons, as you may experience shorter wait times. The office is located at [address], [place], [place], [place] [address] No appointment is necessary.   Labs are drawn by [facility]. Please bring your co-pay at the time of your lab draw.  You may receive a bill from [facility] for your lab work.  If you wish to have your labs drawn at another location, please call the office 24 hours in advance to send orders.  If you have any questions regarding directions or hours of operation,  please call [phone].   As a reminder, please drink plenty of water prior to coming for your lab work. Thanks!  Vaccines You are taking a medication(s) that can suppress your immune system.  The following immunizations are recommended: . Flu annually . Covid-19  . Pneumonia (Pneumovax 23 and Prevnar 13 spaced at least 1 year apart) . Shingrix (after age 8)  Please check with your PCP to make sure you are up to date.   Heart Disease Prevention   Your inflammatory disease increases your risk of heart disease which includes heart attack, stroke, atrial fibrillation (irregular heartbeats), high blood pressure, heart failure and atherosclerosis (plaque in the arteries).  It is important to reduce your risk by:   . Keep blood pressure, cholesterol, and blood sugar at healthy levels   . Smoking Cessation    . Maintain a healthy weight  o BMI 20-25   . Eat a healthy diet  o Plenty of fresh fruit, vegetables, and whole grains  o Limit saturated fats, foods high in sodium, and added sugars  o DASH and Mediterranean diet   . Increase physical activity  o Recommend moderate physically activity for 150 minutes per week/ 30 minutes a day for five days a week These can be broken up into three separate ten-minute sessions during the day.   . Reduce Stress  . Meditation, slow breathing exercises, yoga, coloring books  . Dental visits twice a year

## 2020-06-24 ENCOUNTER — Other Ambulatory Visit: Payer: Self-pay | Admitting: Family Medicine

## 2020-06-29 ENCOUNTER — Ambulatory Visit: Payer: Medicare Other | Admitting: Rheumatology

## 2020-07-19 ENCOUNTER — Ambulatory Visit: Payer: Medicare Other | Admitting: Family Medicine

## 2020-08-10 DIAGNOSIS — Z01419 Encounter for gynecological examination (general) (routine) without abnormal findings: Secondary | ICD-10-CM | POA: Diagnosis not present

## 2020-08-10 DIAGNOSIS — Z1231 Encounter for screening mammogram for malignant neoplasm of breast: Secondary | ICD-10-CM | POA: Diagnosis not present

## 2020-08-29 DIAGNOSIS — I10 Essential (primary) hypertension: Secondary | ICD-10-CM | POA: Diagnosis not present

## 2020-08-29 DIAGNOSIS — Z1211 Encounter for screening for malignant neoplasm of colon: Secondary | ICD-10-CM | POA: Diagnosis not present

## 2020-08-29 DIAGNOSIS — H401132 Primary open-angle glaucoma, bilateral, moderate stage: Secondary | ICD-10-CM | POA: Diagnosis not present

## 2020-08-29 DIAGNOSIS — E782 Mixed hyperlipidemia: Secondary | ICD-10-CM | POA: Diagnosis not present

## 2020-09-01 ENCOUNTER — Other Ambulatory Visit: Payer: Self-pay | Admitting: Physician Assistant

## 2020-09-01 NOTE — Telephone Encounter (Signed)
Last Visit: 11/14/2020 Next Visit: 06/20/2020 Labs: 06/10/2020, UA revealed trace leukocytes. Negative for nitrites and bacteria. RF negative. CBC and CMP WNL. ESR WNL. Complements WNL, ANA remains positive but the titer is lower: 1:320. dsDNA is negative. SPEP did not reveal any monoclonal proteins. Eye exam:  03/29/2020  Current Dose per office note 06/20/2020,  Plaquenil 200 mg 1 tablet by mouth twice daily Monday to Friday CV:[id number] syndrome with keratoconjunctivitis sicca   Last Fill: 05/16/2020  Okay to refill Plaquenil?

## 2020-09-13 DIAGNOSIS — K5730 Diverticulosis of large intestine without perforation or abscess without bleeding: Secondary | ICD-10-CM | POA: Diagnosis not present

## 2020-09-13 DIAGNOSIS — K635 Polyp of colon: Secondary | ICD-10-CM | POA: Diagnosis not present

## 2020-09-13 DIAGNOSIS — Z86010 Personal history of colonic polyps: Secondary | ICD-10-CM | POA: Diagnosis not present

## 2020-09-13 DIAGNOSIS — D124 Benign neoplasm of descending colon: Secondary | ICD-10-CM | POA: Diagnosis not present

## 2020-09-15 ENCOUNTER — Other Ambulatory Visit: Payer: Self-pay | Admitting: Family Medicine

## 2020-09-16 ENCOUNTER — Ambulatory Visit
Admission: RE | Admit: 2020-09-16 | Discharge: 2020-09-16 | Disposition: A | Discharge status: Home / Self Care | Payer: Medicare Other | Source: Ambulatory Visit | Attending: Family Medicine | Admitting: Family Medicine

## 2020-09-16 ENCOUNTER — Other Ambulatory Visit: Payer: Self-pay

## 2020-09-16 DIAGNOSIS — Z780 Asymptomatic menopausal state: Secondary | ICD-10-CM | POA: Diagnosis not present

## 2020-09-16 DIAGNOSIS — R002 Palpitations: Secondary | ICD-10-CM | POA: Diagnosis not present

## 2020-09-16 DIAGNOSIS — E7800 Pure hypercholesterolemia, unspecified: Secondary | ICD-10-CM | POA: Diagnosis not present

## 2020-09-16 DIAGNOSIS — I10 Essential (primary) hypertension: Secondary | ICD-10-CM | POA: Diagnosis not present

## 2020-09-16 DIAGNOSIS — M810 Age-related osteoporosis without current pathological fracture: Secondary | ICD-10-CM | POA: Diagnosis not present

## 2020-09-16 DIAGNOSIS — E2839 Other primary ovarian failure: Secondary | ICD-10-CM

## 2020-09-16 DIAGNOSIS — M8588 Other specified disorders of bone density and structure, other site: Secondary | ICD-10-CM | POA: Diagnosis not present

## 2020-09-16 DIAGNOSIS — M8580 Other specified disorders of bone density and structure, unspecified site: Secondary | ICD-10-CM | POA: Diagnosis not present

## 2020-09-21 ENCOUNTER — Telehealth: Payer: Self-pay

## 2020-09-21 NOTE — Telephone Encounter (Signed)
Notes on file.

## 2020-09-23 ENCOUNTER — Telehealth: Payer: Self-pay | Admitting: *Deleted

## 2020-09-23 NOTE — Telephone Encounter (Signed)
HAVE PATIENT'S NOTE ON FILE....REFERRED BY Michael Boston, MD [phone]. CHART PREP/ md

## 2020-09-26 ENCOUNTER — Ambulatory Visit (INDEPENDENT_AMBULATORY_CARE_PROVIDER_SITE_OTHER): Payer: Medicare Other

## 2020-09-26 ENCOUNTER — Other Ambulatory Visit: Payer: Self-pay | Admitting: *Deleted

## 2020-09-26 DIAGNOSIS — R002 Palpitations: Secondary | ICD-10-CM

## 2020-09-26 NOTE — Progress Notes (Unsigned)
Patient enrolled for [employer] to mail a 14 day ZIO XT monitor to her home. Replace 24 hour holter with 14 day zio patch per transcribed order.

## 2020-09-29 DIAGNOSIS — R002 Palpitations: Secondary | ICD-10-CM | POA: Diagnosis not present

## 2020-10-18 DIAGNOSIS — R002 Palpitations: Secondary | ICD-10-CM | POA: Diagnosis not present

## 2020-10-31 NOTE — Progress Notes (Signed)
Office Visit Note  Patient: Kathleen Mcclain             Date of Birth: Aug 21, 1950           MRN: [id number]             PCP: Michael Boston, MD Referring: Alycia Rossetti, MD Visit Date: 11/14/2020 Occupation: '@GUAROCC' @  Subjective:  Sicca symptoms   History of Present Illness: Kathleen Mcclain is a 70 y.o. female with history of sjogren's syndrome and osteoarthritis.  She is taking plaquenil 200 mg 1 tablet by mouth twice daily Monday through Friday.  She continues to tolerate Plaquenil without any side effects and has not missed any doses recently.  She has ongoing sicca symptoms which have been tolerable overall.  She has been using Systane balance eyedrops on a daily basis and continues to follow-up with her glaucoma specialist as recommended.  She has been using some Biotene products for mouth dryness which provide some symptomatic relief.  She denies any swollen lymph nodes or salivary stones recently.  She has not had any oral or nasal ulcerations.  She denies any facial rashes.  She states about 1 month ago she developed a rash on her torso especially along her bra line which she attributes to increased moisture and sweat while working outside.  She has not had any recurrence of the rash.  She experiences some stiffness in her joints first thing in the morning which typically resolves within 5 minutes.  She denies any increased joint pain or joint swelling recently. She denies any recent falls or fractures.  She had an updated DEXA in May 2022.  She is not currently on treatment for osteoporosis but has been taking vitamin D 1000 units daily.  She cannot take a calcium supplement due to history of kidney stones.   Activities of Daily Living:  Patient reports morning stiffness for 5 minutes.   Patient Denies nocturnal pain.  Difficulty dressing/grooming: Denies Difficulty climbing stairs: Denies Difficulty getting out of chair: Denies Difficulty using hands for taps, buttons,  cutlery, and/or writing: Reports  Review of Systems  Constitutional:  Negative for fatigue.  HENT:  Positive for mouth dryness and nose dryness. Negative for mouth sores.   Eyes:  Positive for dryness. Negative for pain and itching.  Respiratory:  Negative for shortness of breath and difficulty breathing.   Cardiovascular:  Positive for palpitations. Negative for chest pain.  Gastrointestinal:  Negative for blood in stool, constipation and diarrhea.  Endocrine: Negative for increased urination.  Genitourinary:  Negative for difficulty urinating.  Musculoskeletal:  Positive for morning stiffness. Negative for joint pain, joint pain, joint swelling, myalgias, muscle tenderness and myalgias.  Skin:  Negative for color change, rash and redness.  Allergic/Immunologic: Negative for susceptible to infections.  Neurological:  Negative for dizziness, numbness, headaches, memory loss and weakness.  Hematological:  Negative for bruising/bleeding tendency.  Psychiatric/Behavioral:  Negative for confusion.    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   Cataract    Glaucoma    History of kidney stones    Hypercholesteremia    Hypothyroid    Hypothyroidism    Personal history of kidney stones    Pre-diabetes     Family History  Problem Relation Age of Onset   Alzheimer's disease Mother    Cancer Father        Pancreatic Cancer   Hyperlipidemia Father    Arthritis Maternal Grandmother     Diabetes Maternal Grandfather    Stroke Maternal Grandfather    Cancer Paternal Grandmother    Cancer Paternal Grandfather    Thyroid disease Sister    Celiac disease Sister    Thyroid disease Brother    Anxiety disorder Son    Healthy Son    Healthy Daughter    Healthy Daughter    Past Surgical History:  Procedure Laterality Date   CERVICAL BIOPSY  12/07   pre cancerous cells   COLONOSCOPY     DACRORHINOCYSTOTOMY Left 08/31/2017   Procedure: DACROCYSTORHINOSTOMY (DCR);  Surgeon: Clista Bernhardt, MD;  Location: [facility];  Service: Ophthalmology;  Laterality: Left;   ETHMOIDECTOMY Left 08/31/2017   Procedure: ETHMOIDECTOMY, anterior;  Surgeon: Clista Bernhardt, MD;  Location: [facility];  Service: Ophthalmology;  Laterality: Left;   EYE SURGERY Left    tear duct surgery, x4   LIPOMA EXCISION     on back    TEAR DUCT PROBING Left 02/20/2017   Procedure: nasolacrimal duct obstruct dacryocystorhinostomy left eye, anterior ephmoidectomy left eye, probe with stent left eye;  Surgeon: Clista Bernhardt, MD;  Location: [facility];  Service: Ophthalmology;  Laterality: Left;   TONSILLECTOMY     TUBAL LIGATION  5/86   Social History   Social History Narrative   Does not work outside of home.    Has 2 sets of grandchildren--1 set lives in this area. 1 set lives at [employer] [facility].   Approximately 2013-- Was told "PreDiabetic"---She lost 30 pounds. Very careful with diet now. Does not exercise.   Immunization History  Administered Date(s) Administered   Fluad Quad(high Dose 65+) 01/20/2019, 01/19/2020   Influenza Inj Mdck Quad With Preservative 01/21/2018   Influenza,inj,Quad PF,6+ Mos 03/09/2015, 03/05/2016, 01/30/2017, 01/02/2018   Influenza-Unspecified 01/23/2012, 01/29/2013, 02/01/2014, 03/09/2015, 02/22/2016, 01/21/2017   PFIZER(Purple Top)SARS-COV-2 Vaccination 09/08/2019, 09/29/2019, 11/01/2020   Pneumococcal Conjugate-13 04/02/2018   Pneumococcal Polysaccharide-23 07/15/2019   Td  04/23/2005   Tdap 01/29/2013   Zoster, Live 03/09/2015     Objective: Vital Signs: BP (!) 162/90 (BP Location: Left Arm, Patient Position: Sitting, Cuff Size: Normal)   Pulse 71   Ht '5\' 4"'  (1.626 m)   Wt 124 lb 12.8 oz (56.6 kg)   BMI 21.42 kg/m    Physical Exam Vitals and nursing note reviewed.  Constitutional:      Appearance: She is well-developed.  HENT:     Head: Normocephalic and atraumatic.  Eyes:     Conjunctiva/sclera: Conjunctivae normal.  Pulmonary:     Effort: Pulmonary effort is normal.  Abdominal:     Palpations: Abdomen is soft.  Musculoskeletal:     Cervical back: Normal range of motion.  Skin:    General: Skin is warm and dry.     Capillary Refill: Capillary refill takes less than 2 seconds.  Neurological:     Mental Status: She is alert and oriented to person, place, and time.  Psychiatric:  Behavior: Behavior normal.     Musculoskeletal Exam: C-spine, thoracic spine, lumbar spine have good range of motion with no discomfort.  Shoulder joints, elbow joints, wrist joints, MCPs, PIPs, DIPs have good range of motion with no synovitis.  PIP and DIP thickening consistent with osteoarthritis of both hands noted.  Complete fist formation bilaterally.  Hip joints have good range of motion with no discomfort.  No tenderness over trochanter bursa bilaterally.  Knee joints have good range of motion with no warmth or effusion.  Ankle joints have good range of motion with no tenderness or joint swelling.  No evidence of Achilles tendinitis or plantar fasciitis.  No tenderness over MTP joints.  PIP and DIP thickening consistent with osteoarthritis of both feet noted.  CDAI Exam: CDAI Score: -- Patient Global: --; Provider Global: -- Swollen: --; Tender: -- Joint Exam 11/14/2020   No joint exam has been documented for this visit   There is currently no information documented on the homunculus. Go to the Rheumatology activity and complete the homunculus joint  exam.  Investigation: No additional findings.  Imaging: LONG TERM MONITOR (3-14 DAYS)  Result Date: 11/10/2020 Sinus rhythm Episodes of nonsustained atrial tachycardia (longest 7 beats) Patient had a min HR of 44 bpm, max HR of 164 bpm, and avg HR of 67 bpm    Recent Labs: Lab Results  Component Value Date   WBC 6.8 11/02/2020   HGB 14.3 11/02/2020   PLT 207 11/02/2020   NA 143 11/02/2020   K 4.8 11/02/2020   CL 106 11/02/2020   CO2 28 11/02/2020   GLUCOSE 98 11/02/2020   BUN 14 11/02/2020   CREATININE 0.98 11/02/2020   BILITOT 1.6 (H) 11/02/2020   ALKPHOS 79 09/19/2016   AST 19 11/02/2020   ALT 13 11/02/2020   PROT 6.9 11/02/2020   ALBUMIN 4.3 09/19/2016   CALCIUM 10.1 11/02/2020   GFRAA 72 06/10/2020    Speciality Comments: PLQ eye exam: 03/29/2020 WNL. Follow up in 1 year. (starting PLQ per Dr. Edilia Bo).  Procedures:  No procedures performed Allergies: Penicillins and Alphagan [brimonidine]       Assessment / Plan:     Visit Diagnoses: Sjogren's syndrome with keratoconjunctivitis sicca ([facility]) - +ANA, negative Ro, negative La antibodies and sicca symptoms: She continues to have chronic sicca symptoms which have been tolerable overall with over-the-counter products.  She has been using Systane eyedrops as well as Biotene products for symptomatic relief.  She remains on Plaquenil 200 mg 1 tablet by mouth twice daily Monday through Friday.  She continues to tolerate Plaquenil without any side effects and has not missed any doses recently.  She has no cervical lymphadenopathy or parotid swelling, or salivary stones.  She has not had any oral or nasal ulcerations.  She had updated lab work on 11/02/2020 which was reviewed with the patient today in the office: Double-stranded ENA negative, ESR within normal limits, complements within normal limits, and Ro antibody was negative.  Discussed that these lab results are reassuring.  She was strongly encouraged to remain on Plaquenil as  prescribed.  She was advised to notify us if she develops any new or worsening symptoms.  We will repeat lab work in 5 months and follow-up once test have resulted.  Positive ANA (antinuclear antibody) - ANA 1: 640H, dsDNA >1000, and positive antithyroid peroxidase IgG, hx of fatigue, sicca sx, hair loss: Lab work from 11/02/20 was reviewed today in the office: dsDNA negative, ESR WNL,  complements WNL,  and Ro antibody negative.  On 06/10/20: ANA was 1:320NH, dsDNA negative, and complements WNL.  Lab results were discussed today in detail and all questions were addressed.  She has no clinical features of systemic lupus at this time.  She has clinically been doing well taking Plaquenil as prescribed.  Her sicca symptoms have been tolerable with over-the-counter products.  High risk medication use - Plaquenil 200 mg 1 tablet by mouth twice daily Monday to Friday.  PLQ eye exam: 03/29/2020 WNL. Follow up in 1 year.  CBC and CMP updated on 11/02/2020.  Bilirubin remains elevated but LFTs and renal function were within normal limits.  CBC within normal limits.  She will continue to require updated CBC and CMP every 5 months.  Future orders placed today. Discussed the importance of sun protection while taking Plaquenil.  Positive double stranded DNA antibody test - AVISE dsDNA >1000.  She is no clinical features of systemic lupus at this time.  Double-stranded DNA was negative on 06/10/2020 and 11/02/2020.  Primary osteoarthritis of both hands: She has PIP and DIP thickening consistent with osteoarthritis of both hands.  No tenderness or inflammation was noted.  She experiences stiffness in her hands lasting about 5 minutes every morning.  We discussed the importance of joint protection and muscle strengthening.  No signs of inflammatory arthritis noted at this time.  Other fatigue: Chronic, stable. Discussed the importance of regular exercise.   Age-related osteoporosis without current pathological fracture: DEXA  updated on 09/16/20: The BMD measured at Femur Neck Right is 0.663 g/cm2 with a T-score of -2.7.  Results with the patient today in the office.  We also briefly discussed different treatment options on the market for management of osteoporosis.  She has an upcoming appointment with her new PCP Dr. Jacalyn Lefevre on 12/22/2020 at which time she plans on discussing treatment options further.  She will remain on vitamin D 1000 units daily.  She cannot tolerate taking a calcium supplement due to history of kidney stones.  She has not had any recent falls or fractures.   Other medical conditions are listed as follows:   Essential hypertension  History of hypothyroidism  Hypercholesteremia  History of glaucoma  Orders: Orders Placed This Encounter  Procedures   Urinalysis, Routine w reflex microscopic   COMPLETE METABOLIC PANEL WITH GFR   CBC with Differential/Platelet   ANA   Anti-DNA antibody, double-stranded   C3 and C4   Sedimentation rate   VITAMIN D 25 Hydroxy (Vit-D Deficiency, Fractures)   Rheumatoid factor   Serum protein electrophoresis with reflex    No orders of the defined types were placed in this encounter.   Follow-Up Instructions: Return in 5 months (on 04/16/2021) for Sjogren's syndrome, Osteoarthritis.   Ofilia Neas, PA-C  Note - This record has been created using Dragon software.  Chart creation errors have been sought, but may not always  have been located. Such creation errors do not reflect on  the standard of medical care.

## 2020-11-01 DIAGNOSIS — Z23 Encounter for immunization: Secondary | ICD-10-CM | POA: Diagnosis not present

## 2020-11-02 ENCOUNTER — Other Ambulatory Visit: Payer: Self-pay | Admitting: *Deleted

## 2020-11-02 DIAGNOSIS — Z79899 Other long term (current) drug therapy: Secondary | ICD-10-CM

## 2020-11-02 DIAGNOSIS — M3501 Sicca syndrome with keratoconjunctivitis: Secondary | ICD-10-CM

## 2020-11-03 LAB — SJOGRENS SYNDROME-A EXTRACTABLE NUCLEAR ANTIBODY: SSA (Ro) (ENA) Antibody, IgG: <1 AI

## 2020-11-03 LAB — CBC WITH DIFFERENTIAL/PLATELET
Absolute Monocytes: 843 cells/uL (ref 200–950)
Basophils Absolute: 48 cells/uL (ref 0–200)
Basophils Relative: 0.7 %
Eosinophils Absolute: 238 cells/uL (ref 15–500)
Eosinophils Relative: 3.5 %
HCT: 42.9 % (ref 35.0–45.0)
Hemoglobin: 14.3 g/dL (ref 11.7–15.5)
Lymphs Abs: 1414 cells/uL (ref 850–3900)
MCH: 29.8 pg (ref 27.0–33.0)
MCHC: 33.3 g/dL (ref 32.0–36.0)
MCV: 89.4 fL (ref 80.0–100.0)
MPV: 12.2 fL (ref 7.5–12.5)
Monocytes Relative: 12.4 %
Neutro Abs: 4257 cells/uL (ref 1500–7800)
Neutrophils Relative %: 62.6 %
Platelets: 207 10*3/uL (ref 140–400)
RBC: 4.8 10*6/uL (ref 3.80–5.10)
RDW: 12.7 % (ref 11.0–15.0)
Total Lymphocyte: 20.8 %
WBC: 6.8 10*3/uL (ref 3.8–10.8)

## 2020-11-03 LAB — COMPLETE METABOLIC PANEL WITH GFR
AG Ratio: 1.9 (calc) (ref 1.0–2.5)
ALT: 13 U/L (ref 6–29)
AST: 19 U/L (ref 10–35)
Albumin: 4.5 g/dL (ref 3.6–5.1)
Alkaline phosphatase (APISO): 60 U/L (ref 37–153)
BUN: 14 mg/dL (ref 7–25)
CO2: 28 mmol/L (ref 20–32)
Calcium: 10.1 mg/dL (ref 8.6–10.4)
Chloride: 106 mmol/L (ref 98–110)
Creat: 0.98 mg/dL (ref 0.50–1.05)
Globulin: 2.4 g/dL (calc) (ref 1.9–3.7)
Glucose, Bld: 98 mg/dL (ref 65–99)
Potassium: 4.8 mmol/L (ref 3.5–5.3)
Sodium: 143 mmol/L (ref 135–146)
Total Bilirubin: 1.6 mg/dL — ABNORMAL HIGH (ref 0.2–1.2)
Total Protein: 6.9 g/dL (ref 6.1–8.1)
eGFR: 63 mL/min/{1.73_m2} (ref >=60)

## 2020-11-03 LAB — C3 AND C4
C3 Complement: 146 mg/dL (ref 83–193)
C4 Complement: 27 mg/dL (ref 15–57)

## 2020-11-03 LAB — SEDIMENTATION RATE: Sed Rate: 2 mm/h (ref 0–30)

## 2020-11-03 LAB — ANTI-DNA ANTIBODY, DOUBLE-STRANDED: ds DNA Ab: 1 IU/mL

## 2020-11-10 ENCOUNTER — Other Ambulatory Visit (HOSPITAL_COMMUNITY): Payer: Self-pay | Admitting: Internal Medicine

## 2020-11-10 DIAGNOSIS — I471 Supraventricular tachycardia, unspecified: Secondary | ICD-10-CM

## 2020-11-14 ENCOUNTER — Encounter: Payer: Self-pay | Admitting: Physician Assistant

## 2020-11-14 ENCOUNTER — Other Ambulatory Visit: Payer: Self-pay

## 2020-11-14 ENCOUNTER — Ambulatory Visit (INDEPENDENT_AMBULATORY_CARE_PROVIDER_SITE_OTHER): Payer: Medicare Other | Admitting: Physician Assistant

## 2020-11-14 VITALS — BP 162/90 | HR 71 | Ht 64.0 in | Wt 124.8 lb

## 2020-11-14 DIAGNOSIS — R768 Other specified abnormal immunological findings in serum: Secondary | ICD-10-CM | POA: Diagnosis not present

## 2020-11-14 DIAGNOSIS — Z8669 Personal history of other diseases of the nervous system and sense organs: Secondary | ICD-10-CM

## 2020-11-14 DIAGNOSIS — M3501 Sicca syndrome with keratoconjunctivitis: Secondary | ICD-10-CM

## 2020-11-14 DIAGNOSIS — F439 Reaction to severe stress, unspecified: Secondary | ICD-10-CM

## 2020-11-14 DIAGNOSIS — M810 Age-related osteoporosis without current pathological fracture: Secondary | ICD-10-CM | POA: Diagnosis not present

## 2020-11-14 DIAGNOSIS — R5383 Other fatigue: Secondary | ICD-10-CM

## 2020-11-14 DIAGNOSIS — M19042 Primary osteoarthritis, left hand: Secondary | ICD-10-CM

## 2020-11-14 DIAGNOSIS — Z79899 Other long term (current) drug therapy: Secondary | ICD-10-CM

## 2020-11-14 DIAGNOSIS — Z8639 Personal history of other endocrine, nutritional and metabolic disease: Secondary | ICD-10-CM

## 2020-11-14 DIAGNOSIS — M19041 Primary osteoarthritis, right hand: Secondary | ICD-10-CM

## 2020-11-14 DIAGNOSIS — I10 Essential (primary) hypertension: Secondary | ICD-10-CM

## 2020-11-14 DIAGNOSIS — E559 Vitamin D deficiency, unspecified: Secondary | ICD-10-CM

## 2020-11-14 DIAGNOSIS — E7800 Pure hypercholesterolemia, unspecified: Secondary | ICD-10-CM

## 2020-11-16 ENCOUNTER — Ambulatory Visit (HOSPITAL_COMMUNITY)
Admission: RE | Admit: 2020-11-16 | Discharge: 2020-11-16 | Disposition: A | Discharge status: Home / Self Care | Payer: Medicare Other | Source: Ambulatory Visit | Attending: Internal Medicine | Admitting: Internal Medicine

## 2020-11-16 ENCOUNTER — Other Ambulatory Visit: Payer: Self-pay

## 2020-11-16 DIAGNOSIS — I471 Supraventricular tachycardia: Secondary | ICD-10-CM | POA: Diagnosis not present

## 2020-11-16 LAB — ECHOCARDIOGRAM COMPLETE
Area-P 1/2: 3.37 cm2
S' Lateral: 2.6 cm

## 2020-12-13 ENCOUNTER — Other Ambulatory Visit: Payer: Self-pay | Admitting: Family Medicine

## 2020-12-22 DIAGNOSIS — E7800 Pure hypercholesterolemia, unspecified: Secondary | ICD-10-CM | POA: Diagnosis not present

## 2020-12-22 DIAGNOSIS — E039 Hypothyroidism, unspecified: Secondary | ICD-10-CM | POA: Diagnosis not present

## 2020-12-22 DIAGNOSIS — M810 Age-related osteoporosis without current pathological fracture: Secondary | ICD-10-CM | POA: Diagnosis not present

## 2020-12-23 ENCOUNTER — Other Ambulatory Visit: Payer: Self-pay | Admitting: Physician Assistant

## 2020-12-23 NOTE — Telephone Encounter (Signed)
Next Visit: 05/02/2021  Last Visit: 11/14/2020  Labs: 11/02/2020 CBC WNL. Total bilirubin remains elevated but stable. Rest of CMP WNL.    Eye exam: 03/29/2020 WNL   Current Dose per office note 11/14/2020: Plaquenil 200 mg 1 tablet by mouth twice daily Monday to Friday.   DX: Sjogren's syndrome with keratoconjunctivitis sicca   Last Fill: 09/01/2020  Okay to refill Plaquenil?

## 2020-12-29 DIAGNOSIS — Z1331 Encounter for screening for depression: Secondary | ICD-10-CM | POA: Diagnosis not present

## 2020-12-29 DIAGNOSIS — M069 Rheumatoid arthritis, unspecified: Secondary | ICD-10-CM | POA: Diagnosis not present

## 2020-12-29 DIAGNOSIS — R21 Rash and other nonspecific skin eruption: Secondary | ICD-10-CM | POA: Diagnosis not present

## 2020-12-29 DIAGNOSIS — M810 Age-related osteoporosis without current pathological fracture: Secondary | ICD-10-CM | POA: Diagnosis not present

## 2020-12-29 DIAGNOSIS — E7800 Pure hypercholesterolemia, unspecified: Secondary | ICD-10-CM | POA: Diagnosis not present

## 2020-12-29 DIAGNOSIS — I471 Supraventricular tachycardia: Secondary | ICD-10-CM | POA: Diagnosis not present

## 2020-12-29 DIAGNOSIS — E039 Hypothyroidism, unspecified: Secondary | ICD-10-CM | POA: Diagnosis not present

## 2020-12-29 DIAGNOSIS — Z23 Encounter for immunization: Secondary | ICD-10-CM | POA: Diagnosis not present

## 2020-12-29 DIAGNOSIS — Z0000 Encounter for general adult medical examination without abnormal findings: Secondary | ICD-10-CM | POA: Diagnosis not present

## 2020-12-29 DIAGNOSIS — Z1339 Encounter for screening examination for other mental health and behavioral disorders: Secondary | ICD-10-CM | POA: Diagnosis not present

## 2020-12-29 DIAGNOSIS — I10 Essential (primary) hypertension: Secondary | ICD-10-CM | POA: Diagnosis not present

## 2020-12-29 DIAGNOSIS — M3501 Sicca syndrome with keratoconjunctivitis: Secondary | ICD-10-CM | POA: Diagnosis not present

## 2020-12-29 DIAGNOSIS — K5730 Diverticulosis of large intestine without perforation or abscess without bleeding: Secondary | ICD-10-CM | POA: Diagnosis not present

## 2020-12-30 DIAGNOSIS — H401132 Primary open-angle glaucoma, bilateral, moderate stage: Secondary | ICD-10-CM | POA: Diagnosis not present

## 2021-02-16 DIAGNOSIS — H401132 Primary open-angle glaucoma, bilateral, moderate stage: Secondary | ICD-10-CM | POA: Diagnosis not present

## 2021-03-10 DIAGNOSIS — H10413 Chronic giant papillary conjunctivitis, bilateral: Secondary | ICD-10-CM | POA: Diagnosis not present

## 2021-03-10 DIAGNOSIS — H04123 Dry eye syndrome of bilateral lacrimal glands: Secondary | ICD-10-CM | POA: Diagnosis not present

## 2021-03-10 DIAGNOSIS — H16223 Keratoconjunctivitis sicca, not specified as Sjogren's, bilateral: Secondary | ICD-10-CM | POA: Diagnosis not present

## 2021-03-10 DIAGNOSIS — H5213 Myopia, bilateral: Secondary | ICD-10-CM | POA: Diagnosis not present

## 2021-03-10 DIAGNOSIS — H401132 Primary open-angle glaucoma, bilateral, moderate stage: Secondary | ICD-10-CM | POA: Diagnosis not present

## 2021-04-03 ENCOUNTER — Other Ambulatory Visit: Payer: Self-pay | Admitting: Physician Assistant

## 2021-04-03 NOTE — Telephone Encounter (Signed)
Next Visit: 05/02/2021   Last Visit: 11/14/2020   Labs: 11/02/2020 CBC WNL. Total bilirubin remains elevated but stable. Rest of CMP WNL.     Eye exam: 03/29/2020 WNL    Current Dose per office note 11/14/2020: Plaquenil 200 mg 1 tablet by mouth twice daily Monday to Friday.    DX: Sjogren's syndrome with keratoconjunctivitis sicca    Last Fill: 09/01/2020  Patient states she has her PLQ eye exam on 05/02/2021. Patient advised she is due to update labs. Patient states she is out of town and will update labs at her appointment on 05/02/2021.    Okay to refill Plaquenil?

## 2021-04-18 NOTE — Progress Notes (Signed)
Office Visit Note  Patient: Kathleen Mcclain             Date of Birth: [birth year]           MRN: [id number]             PCP: Michael Boston, MD Referring: Michael Boston, MD Visit Date: 05/02/2021 Occupation: @GUAROCC @  Subjective:  Dry mouth and dry eyes  History of Present Illness: Kathleen Mcclain is a 70 y.o. female with a history of Sjogren's, osteoarthritis and osteoporosis.  She states she continues to have dry mouth and dry eyes symptoms which are manageable with over-the-counter products.  She denies any history of oral ulcers, nasal ulcers, malar rash, photosensitivity, Raynaud's phenomenon or lymphadenopathy.  She states her eye examination is a scheduled in March 2022.  She has been taking alendronate once a week and has been tolerating it well without any side effects.  Activities of Daily Living:  Patient reports morning stiffness for 0 minutes.   Patient Denies nocturnal pain.  Difficulty dressing/grooming: Denies Difficulty climbing stairs: Denies Difficulty getting out of chair: Denies Difficulty using hands for taps, buttons, cutlery, and/or writing: Reports  Review of Systems  Constitutional:  Positive for fatigue.  HENT:  Positive for mouth dryness and nose dryness. Negative for mouth sores.   Eyes:  Positive for dryness. Negative for pain and itching.  Respiratory:  Negative for shortness of breath and difficulty breathing.   Cardiovascular:  Negative for chest pain and palpitations.  Gastrointestinal:  Negative for blood in stool, constipation and diarrhea.  Endocrine: Negative for increased urination.  Genitourinary:  Negative for difficulty urinating.  Musculoskeletal:  Negative for joint pain, joint pain, joint swelling, myalgias, morning stiffness, muscle tenderness and myalgias.  Skin:  Positive for rash. Negative for color change.  Allergic/Immunologic: Negative for susceptible to infections.  Neurological:  Negative for dizziness, numbness,  headaches, memory loss and weakness.  Hematological:  Positive for bruising/bleeding tendency.  Psychiatric/Behavioral:  Negative for confusion.    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   Cataract    Glaucoma    History of kidney stones    Hypercholesteremia    Hypothyroid    Hypothyroidism    Personal history of kidney stones    Pre-diabetes     Family History  Problem Relation Age of Onset   Alzheimer's disease Mother    Cancer Father        Pancreatic Cancer   Hyperlipidemia Father    Arthritis Maternal Grandmother    Diabetes Maternal Grandfather    Stroke Maternal Grandfather    Cancer Paternal Grandmother    Cancer Paternal Grandfather    Thyroid disease Sister    Celiac disease Sister    Thyroid disease Brother    Anxiety disorder Son    Healthy Son    Healthy Daughter    Healthy Daughter    Past Surgical History:  Procedure Laterality Date   CERVICAL BIOPSY  12/07   pre cancerous cells   COLONOSCOPY     DACRORHINOCYSTOTOMY Left 08/31/2017   Procedure: DACROCYSTORHINOSTOMY (DCR);  Surgeon: Clista Bernhardt, MD;  Location: [facility];  Service: Ophthalmology;  Laterality: Left;   ETHMOIDECTOMY Left 08/31/2017   Procedure: ETHMOIDECTOMY, anterior;  Surgeon: Clista Bernhardt, MD;  Location: [facility];  Service: Ophthalmology;  Laterality: Left;   EYE SURGERY Left    tear duct surgery, x4   LIPOMA EXCISION     on back    TEAR DUCT PROBING Left 02/20/2017    Procedure: nasolacrimal duct obstruct dacryocystorhinostomy left eye, anterior ephmoidectomy left eye, probe with stent left eye;  Surgeon: Clista Bernhardt, MD;  Location: [facility];  Service: Ophthalmology;  Laterality: Left;   TONSILLECTOMY     TUBAL LIGATION  5/86   Social History   Social History Narrative   Does not work outside of home.    Has 2 sets of grandchildren--1 set lives in this area. 1 set lives at [place].   Approximately 2013-- Was told "PreDiabetic"---She lost 30 pounds. Very careful with diet now. Does not exercise.   Immunization History  Administered Date(s) Administered   Fluad Quad(high Dose 65+) 01/20/2019, 01/19/2020   Influenza Inj Mdck Quad With Preservative 01/21/2018   Influenza,inj,Quad PF,6+ Mos 03/09/2015, 03/05/2016, 01/30/2017, 01/02/2018   Influenza-Unspecified 01/23/2012, 01/29/2013, 02/01/2014, 03/09/2015, 02/22/2016, 01/21/2017   PFIZER(Purple Top)SARS-COV-2 Vaccination 09/08/2019, 09/29/2019, 11/01/2020   Pneumococcal Conjugate-13 04/02/2018   Pneumococcal Polysaccharide-23 07/15/2019   Td 04/23/2005   Tdap 01/29/2013   Zoster, Live 03/09/2015     Objective: Vital Signs: BP (!) 168/98 (BP Location: Right Arm, Patient Position: Sitting, Cuff Size: Normal)    Pulse 69    Ht 5\' 4"  (1.626 m)    Wt 125 lb 9.6 oz (57 kg)    BMI 21.56 kg/m    Physical Exam Vitals and nursing note reviewed.  Constitutional:      Appearance: She is well-developed.  HENT:     Head: Normocephalic and atraumatic.  Eyes:     Conjunctiva/sclera: Conjunctivae normal.  Cardiovascular:     Rate and Rhythm: Normal rate and regular rhythm.     Heart sounds: Normal heart sounds.  Pulmonary:     Effort: Pulmonary effort is normal.     Breath sounds: Normal breath sounds.  Abdominal:     General: Bowel sounds are normal.     Palpations: Abdomen is soft.  Musculoskeletal:     Cervical back: Normal range of motion.  Lymphadenopathy:     Cervical: No cervical  adenopathy.  Skin:    General: Skin is warm and dry.     Capillary Refill: Capillary refill takes less than 2 seconds.  Neurological:     Mental Status: She is alert and oriented to person, place, and time.  Psychiatric:        Behavior: Behavior normal.     Musculoskeletal Exam: C-spine was in good range of motion.  Shoulder joints, elbow joints, wrist joints, MCPs PIPs and DIPs with good range of motion with no synovitis.  Hip joints, knee joints, ankles, MTPs and PIPs with good range of motion with no synovitis.  CDAI Exam: CDAI Score: -- Patient Global: --; Provider Global: -- Swollen: --; Tender: -- Joint Exam 05/02/2021   No joint exam has been documented for this visit   There is currently no information documented on the homunculus. Go to the Rheumatology activity and complete  the homunculus joint exam.  Investigation: No additional findings.  Imaging: No results found.  Recent Labs: Lab Results  Component Value Date   WBC 6.8 11/02/2020   HGB 14.3 11/02/2020   PLT 207 11/02/2020   NA 143 11/02/2020   K 4.8 11/02/2020   CL 106 11/02/2020   CO2 28 11/02/2020   GLUCOSE 98 11/02/2020   BUN 14 11/02/2020   CREATININE 0.98 11/02/2020   BILITOT 1.6 (H) 11/02/2020   ALKPHOS 79 09/19/2016   AST 19 11/02/2020   ALT 13 11/02/2020   PROT 6.9 11/02/2020   ALBUMIN 4.3 09/19/2016   CALCIUM 10.1 11/02/2020   GFRAA 72 06/10/2020    Speciality Comments: PLQ eye exam: 03/29/2020 WNL. Follow up in 1 year. (starting PLQ per Dr. Edilia Bo).  Procedures:  No procedures performed Allergies: Penicillins and Alphagan [brimonidine]   Assessment / Plan:     Visit Diagnoses: Sjogren's syndrome with keratoconjunctivitis sicca ([facility]) - +ANA, positive centromere, negative Ro, negative La antibodies and sicca symptoms: -She continues to have sicca symptoms.  Over-the-counter products were discussed.  Plan: Urinalysis, Routine w reflex microscopic  Positive ANA (antinuclear antibody) -  ANA 1: 640H, dsDNA >1000, and positive antithyroid peroxidase IgG, hx of fatigue, sicca sx, hair loss: She denies any history of oral ulcers, nasal ulcers, malar rash, photosensitivity, Raynaud's phenomenon or lymphadenopathy.  Positive double stranded DNA antibody test - AVISE dsDNA >1000.  She is no clinical features of systemic lupus at this time.  Double-stranded DNA was negative on 06/10/2020 and 11/02/2020. - Plan: Anti-DNA antibody, double-stranded, C3 and C4, Sedimentation rate  High risk medication use - Plaquenil 200 mg 1 tablet by mouth twice daily Monday to Friday.  PLQ eye exam: 03/29/2020 WNL.  Patient rescheduled her eye examination for March 2022 when she was out of town.- Plan: CBC with Differential/Platelet, COMPLETE METABOLIC PANEL WITH GFR we will check labs today.  Information regarding immunization was placed in the AVS.  Primary osteoarthritis of both hands-  Other fatigue-she feels tired in the afternoon .  Age-related osteoporosis without current pathological fracture - DEXA updated on 09/16/20: The BMD measured at Femur Neck Right is 0.663 g/cm2 with a T-score of -2.7.  Started alendronate in November 2022.  She has been tolerating alendronate without any side effects.  Use of calcium rich diet and vitamin D was discussed.  Patient states her vitamin D is monitored by her PCP  which has been normal.  Essential hypertension-patient blood pressure was elevated today.  She has been advised to monitor blood pressure closely and follow-up with her PCP.  Complications of chronic hypertension were discussed.  Hypercholesteremia-dietary modifications and exercise was emphasized.  History of glaucoma  History of hypothyroidism  Orders: Orders Placed This Encounter  Procedures   CBC with Differential/Platelet   COMPLETE METABOLIC PANEL WITH GFR   Urinalysis, Routine w reflex microscopic   Anti-DNA antibody, double-stranded   C3 and C4   Sedimentation rate   No orders of the  defined types were placed in this encounter.    Follow-Up Instructions: Return in about 5 months (around 09/30/2021) for Sjogren's.   Bo Merino, MD  Note - This record has been created using Editor, commissioning.  Chart creation errors have been sought, but may not always  have been located. Such creation errors do not reflect on  the standard of medical care.

## 2021-04-30 ENCOUNTER — Other Ambulatory Visit: Payer: Self-pay | Admitting: Physician Assistant

## 2021-05-01 NOTE — Telephone Encounter (Signed)
Next Visit: 05/02/2021   Last Visit: 11/14/2020   Labs: 11/02/2020 CBC WNL. Total bilirubin remains elevated but stable. Rest of CMP WNL.     Eye exam: 03/29/2020 WNL    Current Dose per office note 11/14/2020: Plaquenil 200 mg 1 tablet by mouth twice daily Monday to Friday.    DX: Sjogren's syndrome with keratoconjunctivitis sicca    Last Fill: 04/03/2021 (30 day supply)    Patient states she has her PLQ eye exam on 05/02/2021. Patient advised she is due to update labs. Patient states she is out of town and will update labs at her appointment on 05/02/2021.    Okay to refill Plaquenil?

## 2021-05-02 ENCOUNTER — Ambulatory Visit (INDEPENDENT_AMBULATORY_CARE_PROVIDER_SITE_OTHER): Payer: Medicare Other | Admitting: Rheumatology

## 2021-05-02 ENCOUNTER — Encounter: Payer: Self-pay | Admitting: Rheumatology

## 2021-05-02 ENCOUNTER — Other Ambulatory Visit: Payer: Self-pay

## 2021-05-02 VITALS — BP 168/98 | HR 69 | Ht 64.0 in | Wt 125.6 lb

## 2021-05-02 DIAGNOSIS — Z8669 Personal history of other diseases of the nervous system and sense organs: Secondary | ICD-10-CM

## 2021-05-02 DIAGNOSIS — R768 Other specified abnormal immunological findings in serum: Secondary | ICD-10-CM

## 2021-05-02 DIAGNOSIS — R5383 Other fatigue: Secondary | ICD-10-CM | POA: Diagnosis not present

## 2021-05-02 DIAGNOSIS — E7800 Pure hypercholesterolemia, unspecified: Secondary | ICD-10-CM

## 2021-05-02 DIAGNOSIS — M19042 Primary osteoarthritis, left hand: Secondary | ICD-10-CM | POA: Diagnosis not present

## 2021-05-02 DIAGNOSIS — I10 Essential (primary) hypertension: Secondary | ICD-10-CM | POA: Diagnosis not present

## 2021-05-02 DIAGNOSIS — Z79899 Other long term (current) drug therapy: Secondary | ICD-10-CM | POA: Diagnosis not present

## 2021-05-02 DIAGNOSIS — M810 Age-related osteoporosis without current pathological fracture: Secondary | ICD-10-CM

## 2021-05-02 DIAGNOSIS — M19041 Primary osteoarthritis, right hand: Secondary | ICD-10-CM

## 2021-05-02 DIAGNOSIS — M3501 Sicca syndrome with keratoconjunctivitis: Secondary | ICD-10-CM | POA: Diagnosis not present

## 2021-05-02 DIAGNOSIS — Z8639 Personal history of other endocrine, nutritional and metabolic disease: Secondary | ICD-10-CM | POA: Diagnosis not present

## 2021-05-02 NOTE — Patient Instructions (Addendum)
Standing Labs We placed an order today for your standing lab work.   Please have your standing labs drawn in June  If possible, please have your labs drawn 2 weeks prior to your appointment so that the provider can discuss your results at your appointment.  Please note that you may see your imaging and lab results in [facility] before we have reviewed them. We may be awaiting multiple results to interpret others before contacting you. Please allow our office up to 72 hours to thoroughly review all of the results before contacting the office for clarification of your results.  We have open lab daily: Monday through Thursday from 1:30-4:30 PM and Friday from 1:30-4:00 PM at the office of Dr. Bo Merino, [facility].   Please be advised, all patients with office appointments requiring lab work will take precedent over walk-in lab work.  If possible, please come for your lab work on Monday and Friday afternoons, as you may experience shorter wait times. The office is located at [address], [place], [place],  [address] No appointment is necessary.   Labs are drawn by [facility]. Please bring your co-pay at the time of your lab draw.  You may receive a bill from [facility] for your lab work.  If you wish to have your labs drawn at another location, please call the office 24 hours in advance to send orders.  If you have any questions regarding directions or hours of operation,  please call [phone].   As a reminder, please drink plenty of water prior to coming for your lab work. Thanks!   Vaccines You are taking a medication(s) that can suppress your immune system.  The following immunizations are recommended: Flu annually Covid-19  Td/Tdap (tetanus, diphtheria, pertussis) every 10 years Pneumonia (Prevnar 15 then Pneumovax 23 at least 1 year apart.  Alternatively, can take Prevnar 20 without needing additional dose) Shingrix: 2 doses from 4 weeks to 6 months apart

## 2021-05-03 LAB — COMPLETE METABOLIC PANEL WITH GFR
AG Ratio: 1.8 (calc) (ref 1.0–2.5)
ALT: 14 U/L (ref 6–29)
AST: 20 U/L (ref 10–35)
Albumin: 4.4 g/dL (ref 3.6–5.1)
Alkaline phosphatase (APISO): 51 U/L (ref 37–153)
BUN: 13 mg/dL (ref 7–25)
CO2: 30 mmol/L (ref 20–32)
Calcium: 10.1 mg/dL (ref 8.6–10.4)
Chloride: 107 mmol/L (ref 98–110)
Creat: 0.88 mg/dL (ref 0.50–1.05)
Globulin: 2.4 g/dL (calc) (ref 1.9–3.7)
Glucose, Bld: 89 mg/dL (ref 65–99)
Potassium: 5.4 mmol/L — ABNORMAL HIGH (ref 3.5–5.3)
Sodium: 143 mmol/L (ref 135–146)
Total Bilirubin: 1.5 mg/dL — ABNORMAL HIGH (ref 0.2–1.2)
Total Protein: 6.8 g/dL (ref 6.1–8.1)
eGFR: 72 mL/min/{1.73_m2} (ref >=60)

## 2021-05-03 LAB — URINALYSIS, ROUTINE W REFLEX MICROSCOPIC
Bilirubin Urine: NEGATIVE
Glucose, UA: NEGATIVE
Hgb urine dipstick: NEGATIVE
Ketones, ur: NEGATIVE
Leukocytes,Ua: NEGATIVE
Nitrite: NEGATIVE
Protein, ur: NEGATIVE
Specific Gravity, Urine: 1.012 (ref 1.001–1.035)
pH: 7 (ref 5.0–8.0)

## 2021-05-03 LAB — CBC WITH DIFFERENTIAL/PLATELET
Absolute Monocytes: 552 cells/uL (ref 200–950)
Basophils Absolute: 42 cells/uL (ref 0–200)
Basophils Relative: 0.7 %
Eosinophils Absolute: 180 cells/uL (ref 15–500)
Eosinophils Relative: 3 %
HCT: 43.7 % (ref 35.0–45.0)
Hemoglobin: 14.2 g/dL (ref 11.7–15.5)
Lymphs Abs: 1278 cells/uL (ref 850–3900)
MCH: 29.6 pg (ref 27.0–33.0)
MCHC: 32.5 g/dL (ref 32.0–36.0)
MCV: 91.2 fL (ref 80.0–100.0)
MPV: 12.2 fL (ref 7.5–12.5)
Monocytes Relative: 9.2 %
Neutro Abs: 3948 cells/uL (ref 1500–7800)
Neutrophils Relative %: 65.8 %
Platelets: 223 10*3/uL (ref 140–400)
RBC: 4.79 10*6/uL (ref 3.80–5.10)
RDW: 12.4 % (ref 11.0–15.0)
Total Lymphocyte: 21.3 %
WBC: 6 10*3/uL (ref 3.8–10.8)

## 2021-05-03 LAB — ANTI-DNA ANTIBODY, DOUBLE-STRANDED: ds DNA Ab: <1 IU/mL

## 2021-05-03 LAB — SEDIMENTATION RATE: Sed Rate: 2 mm/h (ref 0–30)

## 2021-05-03 LAB — C3 AND C4
C3 Complement: 152 mg/dL (ref 83–193)
C4 Complement: 28 mg/dL (ref 15–57)

## 2021-05-03 NOTE — Progress Notes (Signed)
CBC is normal, CMP shows elevated potassium, most likely due to the use of lisinopril.Marland Kitchen  UA negative, double-stranded ENA negative, complements normal, sed rate normal.

## 2021-05-10 DIAGNOSIS — Z79899 Other long term (current) drug therapy: Secondary | ICD-10-CM | POA: Diagnosis not present

## 2021-05-10 DIAGNOSIS — H2513 Age-related nuclear cataract, bilateral: Secondary | ICD-10-CM | POA: Diagnosis not present

## 2021-05-10 DIAGNOSIS — M3501 Sicca syndrome with keratoconjunctivitis: Secondary | ICD-10-CM | POA: Diagnosis not present

## 2021-05-10 DIAGNOSIS — H401132 Primary open-angle glaucoma, bilateral, moderate stage: Secondary | ICD-10-CM | POA: Diagnosis not present

## 2021-06-06 ENCOUNTER — Other Ambulatory Visit: Payer: Self-pay | Admitting: Rheumatology

## 2021-06-06 NOTE — Telephone Encounter (Signed)
Next Visit: 10/02/2021  Last Visit: 05/02/2021  Labs: 05/02/2021 CBC is normal, CMP shows elevated potassium,   Eye exam: 03/29/2020 WNL.    Current Dose per office note 05/02/2021: Plaquenil 200 mg 1 tablet by mouth twice daily Monday to Friday.  PC:[id number] syndrome with keratoconjunctivitis sicca   Last Fill: 05/01/2021 (30 day supply)  Patient has her PLQ eye exam scheduled for 06/27/2021  Okay to refill Plaquenil?

## 2021-06-07 ENCOUNTER — Telehealth: Payer: Self-pay | Admitting: *Deleted

## 2021-06-07 NOTE — Telephone Encounter (Signed)
Submitted a Prior Authorization request to [employer] for  PLQ  via CoverMyMeds. Will update once we receive a response.

## 2021-06-08 MED ORDER — HYDROXYCHLOROQUINE SULFATE 200 MG PO TABS: ORAL_TABLET | ORAL | 0 refills | Status: DC

## 2021-06-08 NOTE — Telephone Encounter (Signed)
Received a fax regarding Prior Authorization from [employer] for  [place] .   Contacted patient to advised her that the prior authorization for her PLQ has been declined. Patient advised she may use a good rx coupon to get the medication at a cheaper price. Patient advised of several different places and their prices. Patient requested the prescription to be sent to [facility].

## 2021-06-08 NOTE — Addendum Note (Signed)
Addended by: Carole Binning on: 06/08/2021 02:32 PM   Modules accepted: Orders

## 2021-06-27 DIAGNOSIS — M3500 Sicca syndrome, unspecified: Secondary | ICD-10-CM | POA: Diagnosis not present

## 2021-06-27 DIAGNOSIS — Z79899 Other long term (current) drug therapy: Secondary | ICD-10-CM | POA: Diagnosis not present

## 2021-06-27 DIAGNOSIS — H2513 Age-related nuclear cataract, bilateral: Secondary | ICD-10-CM | POA: Diagnosis not present

## 2021-06-27 DIAGNOSIS — H401132 Primary open-angle glaucoma, bilateral, moderate stage: Secondary | ICD-10-CM | POA: Diagnosis not present

## 2021-06-29 ENCOUNTER — Other Ambulatory Visit: Payer: Self-pay | Admitting: Rheumatology

## 2021-06-29 NOTE — Telephone Encounter (Signed)
Next Visit: 10/02/2021 ? ?Last Visit: 05/02/2021 ?  ?Labs: 05/02/2021 CBC is normal, CMP shows elevated potassium,  ?  ?Eye exam: 03/29/2020 WNL.   ?  ?Current Dose per office note 05/02/2021: Plaquenil 200 mg 1 tablet by mouth twice daily Monday to Friday. ?  ?[id number] syndrome with keratoconjunctivitis sicca  ?  ?Last Fill: 06/08/2021 (30 day supply) ?  ?Patient states she had her PLQ eye exam this week and will call the eye doctor to remind them to send [place] the results.  ?  ?Okay to refill Plaquenil?  ?

## 2021-06-30 DIAGNOSIS — I10 Essential (primary) hypertension: Secondary | ICD-10-CM | POA: Diagnosis not present

## 2021-06-30 DIAGNOSIS — M3501 Sicca syndrome with keratoconjunctivitis: Secondary | ICD-10-CM | POA: Diagnosis not present

## 2021-06-30 DIAGNOSIS — E7800 Pure hypercholesterolemia, unspecified: Secondary | ICD-10-CM | POA: Diagnosis not present

## 2021-06-30 DIAGNOSIS — R7303 Prediabetes: Secondary | ICD-10-CM | POA: Diagnosis not present

## 2021-06-30 DIAGNOSIS — M069 Rheumatoid arthritis, unspecified: Secondary | ICD-10-CM | POA: Diagnosis not present

## 2021-06-30 DIAGNOSIS — I471 Supraventricular tachycardia: Secondary | ICD-10-CM | POA: Diagnosis not present

## 2021-07-24 DIAGNOSIS — H401132 Primary open-angle glaucoma, bilateral, moderate stage: Secondary | ICD-10-CM | POA: Diagnosis not present

## 2021-07-27 ENCOUNTER — Other Ambulatory Visit: Payer: Self-pay | Admitting: Rheumatology

## 2021-07-27 NOTE — Telephone Encounter (Signed)
Next Visit: 10/02/2021 ? ?Last Visit: 05/02/2021 ? ?Labs: 05/02/2021, CBC is normal, CMP shows elevated potassium, most likely due to the use of lisinopril.Marland Kitchen  UA negative, double-stranded ENA negative, complements normal, sed rate normal. ? ?Eye exam: 06/27/2021  ? ?Current Dose per office note 05/02/2021: Plaquenil 200 mg 1 tablet by mouth twice daily Monday to Friday ? ?GN:[id number]'G syndrome with keratoconjunctivitis sicca  ? ?Last Fill: 06/29/2021 ? ?Okay to refill Plaquenil? ? ?

## 2021-09-08 DIAGNOSIS — Z1231 Encounter for screening mammogram for malignant neoplasm of breast: Secondary | ICD-10-CM | POA: Diagnosis not present

## 2021-09-20 NOTE — Progress Notes (Signed)
Office Visit Note  Patient: Kathleen Mcclain             Date of Birth: 09/04/1950           MRN: [id number]             PCP: Michael Boston, MD Referring: Michael Boston, MD Visit Date: 10/03/2021 Occupation: _0 @  Subjective:  Medication monitoring   History of Present Illness: SILA SARSFIELD is a 71 y.o. female with history of Sjogren syndrome, osteoarthritis, and osteoporosis.  Patient is currently taking Plaquenil 200 mg 1 tablet by mouth twice daily Monday through Friday.  She continues to tolerate Plaquenil without any side effects.  She has ongoing sicca symptoms and has been using Biotene products as well as refresh eyedrops for symptomatic relief.  She has been seeing her dentist every 4 months and her ophthalmologist on a yearly basis.  She denies any parotid swelling or tenderness.  She has not noticed any swollen lymph nodes or sores in her mouth or nose.  She denies any shortness of breath, pleuritic chest pain, or cough.  She denies any increased joint pain or joint swelling at this time.  She denies any falls or fractures recently.  She remains on fosamax as prescribed.     Activities of Daily Living:  Patient reports morning stiffness for 0  none .   Patient Denies nocturnal pain.  Difficulty dressing/grooming: Denies Difficulty climbing stairs: Denies Difficulty getting out of chair: Denies Difficulty using hands for taps, buttons, cutlery, and/or writing: Denies  Review of Systems  Constitutional:  Positive for fatigue.  HENT:  Positive for mouth dryness.   Eyes:  Positive for dryness.  Respiratory:  Negative for shortness of breath.   Cardiovascular:  Negative for swelling in legs/feet.  Gastrointestinal:  Negative for constipation.  Endocrine: Positive for cold intolerance and increased urination.  Genitourinary:  Negative for difficulty urinating.  Musculoskeletal:  Positive for joint pain and joint pain.  Skin:  Positive for rash.   Allergic/Immunologic: Negative for susceptible to infections.  Neurological:  Negative for numbness.  Hematological:  Negative for bruising/bleeding tendency.  Psychiatric/Behavioral:  Negative for sleep disturbance.     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   Cataract    Glaucoma    History of kidney stones    Hypercholesteremia    Hypothyroid    Hypothyroidism    Personal history of kidney stones    Pre-diabetes     Family History  Problem Relation Age of Onset   Alzheimer's disease Mother    Cancer Father        Pancreatic Cancer   Hyperlipidemia Father    Arthritis Maternal Grandmother    Diabetes Maternal Grandfather    Stroke Maternal Grandfather    Cancer Paternal Grandmother    Cancer Paternal Grandfather    Thyroid disease Sister    Celiac disease Sister  Thyroid disease Brother    Anxiety disorder Son    Healthy Son    Healthy Daughter    Healthy Daughter    Past Surgical History:  Procedure Laterality Date   CERVICAL BIOPSY  12/07   pre cancerous cells   COLONOSCOPY     DACRORHINOCYSTOTOMY Left 08/31/2017   Procedure: DACROCYSTORHINOSTOMY (DCR);  Surgeon: Clista Bernhardt, MD;  Location: [facility];  Service: Ophthalmology;  Laterality: Left;   ETHMOIDECTOMY Left 08/31/2017   Procedure: ETHMOIDECTOMY, anterior;  Surgeon: Clista Bernhardt, MD;  Location: [facility];  Service: Ophthalmology;  Laterality: Left;   EYE SURGERY Left    tear duct surgery,  x4   LIPOMA EXCISION     on back    TEAR DUCT PROBING Left 02/20/2017   Procedure: nasolacrimal duct obstruct dacryocystorhinostomy left eye, anterior ephmoidectomy left eye, probe with stent left eye;  Surgeon: Clista Bernhardt, MD;  Location: [facility];  Service: Ophthalmology;  Laterality: Left;   TONSILLECTOMY     TUBAL LIGATION  5/86   Social History   Social History Narrative   Does not work outside of home.    Has 2 sets of grandchildren--1 set lives in this area. 1 set lives at [place] [facility].   Approximately 2013-- Was told "PreDiabetic"---She lost 30 pounds. Very careful with diet now. Does not exercise.   Immunization History  Administered Date(s) Administered   Fluad Quad(high Dose 65+) 01/20/2019, 01/19/2020   Influenza Inj Mdck Quad With Preservative 01/21/2018   Influenza,inj,Quad PF,6+ Mos 03/09/2015, 03/05/2016, 01/30/2017, 01/02/2018   Influenza-Unspecified 01/23/2012, 01/29/2013, 02/01/2014, 03/09/2015, 02/22/2016, 01/21/2017   PFIZER(Purple Top)SARS-COV-2 Vaccination 09/08/2019, 09/29/2019, 11/01/2020   Pneumococcal Conjugate-13 04/02/2018   Pneumococcal Polysaccharide-23 07/15/2019   Td 04/23/2005   Tdap 01/29/2013   Zoster, Live 03/09/2015     Objective: Vital Signs: BP (!) 151/89 (BP Location: Left Arm, Patient Position: Sitting, Cuff Size: Small)   Pulse 72   Resp 12   Ht 5' 4" (1.626 m)   Wt 127 lb 12.8 oz (58 kg)   BMI 21.94 kg/m    Physical Exam Vitals and nursing note reviewed.  Constitutional:      Appearance: She is well-developed.  HENT:     Head: Normocephalic and atraumatic.  Eyes:     Conjunctiva/sclera: Conjunctivae normal.  Cardiovascular:     Rate and Rhythm: Normal rate and regular rhythm.     Heart sounds: Normal heart sounds.  Pulmonary:     Effort: Pulmonary effort is normal.     Breath sounds: Normal breath sounds.  Abdominal:     General: Bowel sounds are normal.     Palpations: Abdomen is soft.  Musculoskeletal:     Cervical  back: Normal range of motion.  Lymphadenopathy:     Cervical: No cervical adenopathy.  Skin:    General: Skin is warm and dry.     Capillary Refill: Capillary refill takes less than 2 seconds.  Neurological:     Mental Status: She is alert and oriented to person, place, and time.  Psychiatric:        Behavior: Behavior normal.      Musculoskeletal Exam: C-spine, thoracic spine, and lumbar spine good ROM.  Shoulder joints, elbow joints, wrist joints, MCPs, PIPs, and DIPs good ROM with no synovitis.  Complete fist formation bilaterally.  Hip joints, knee joints, and ankle joints have good ROM with no discomfort.  No warmth or effusion of knee joints.  No tenderness or swelling of ankle joints.  CDAI Exam: CDAI Score: -- Patient Global: --; Provider Global: -- Swollen: --; Tender: -- Joint Exam 10/03/2021   No joint exam has been documented for this visit   There is currently no information documented on the homunculus. Go to the Rheumatology activity and complete the homunculus joint exam.  Investigation: No additional findings.  Imaging: No results found.  Recent Labs: Lab Results  Component Value Date   WBC 6.5 09/28/2021   HGB 14.8 09/28/2021   PLT CANCELED 09/28/2021   NA 147 (H) 09/28/2021   K 4.5 09/28/2021   CL 109 09/28/2021   CO2 27 09/28/2021   GLUCOSE 89 09/28/2021   BUN 12 09/28/2021   CREATININE 0.96 09/28/2021   BILITOT 1.5 (H) 09/28/2021   ALKPHOS 79 09/19/2016   AST 20 09/28/2021   ALT 15 09/28/2021   PROT 7.1 09/28/2021   PROT 7.4 09/28/2021   ALBUMIN 4.3 09/19/2016   CALCIUM 10.1 09/28/2021   GFRAA 72 06/10/2020    Speciality Comments: PLQ eye exam:06/27/2021 WNL Follow up in 1 year. [facility]  Procedures:  No procedures performed Allergies: Penicillins and Alphagan [brimonidine]       Assessment / Plan:     Visit Diagnoses: Sjogren's syndrome with keratoconjunctivitis sicca ([facility]) - +ANA, positive centromere, negative Ro, negative  La antibodies and sicca symptoms: She continues to have chronic sicca symptoms.  She has been using Biotene products as well as refresh eyedrops for symptomatic relief.  She sees her dentist every 4 months and has been seeing the ophthalmologist on a yearly basis as recommended.  Overall she has clinically been doing well taking Plaquenil 200 mg 1 tablet by mouth twice daily Monday through Friday.  She continues to tolerate Plaquenil without any side effects. Lab work from 09/28/2021 was reviewed today in the office: SPEP did not reveal any abnormal protein bands, RF negative, ESR within normal limits, complements within normal limits, double-stranded DNA negative, ANA 1: 320 nuclear, dense fine speckled, WBC count WNL, creatinine and GFR WNL.  Discussed the 4-14 fold increased risk for developing lymphoma in patients with Sjogren's syndrome.  No cervical lymphadenopathy was palpable.  As discussed above the SPEP was unremarkable. I also discussed the associated risk of developing ILD.  Her lungs were clear to auscultation on examination today.  She has not been experiencing any shortness of breath, pleuritic chest pain, or cough. She has no signs of inflammatory arthritis at this time.  No joint tenderness or synovitis was noted.  She will remain on the current dose of Plaquenil as prescribed.  She was advised to notify us if she develops signs or symptoms of a flare.  She will follow-up in the office in 5 months or sooner if needed.  Positive ANA (antinuclear antibody) - ANA 1: 640H, dsDNA >1000, and positive antithyroid peroxidase IgG, hx of fatigue, sicca sx, hair loss: Lab work from 09/28/2021 was reviewed with the patient today in the office.  She will remain on Plaquenil as prescribed.  Positive double stranded DNA antibody test - AVISE dsDNA >1000.  She is no clinical features of systemic lupus at this time.  Double-stranded DNA was negative on 06/10/2020, 11/02/2020, 05/02/21, and on 09/28/2021.  High risk  medication use - Plaquenil 200 mg 1 tablet by mouth twice daily Monday to Friday.  CBC and CMP drawn on 09/28/2021.  Results were discussed with the patient today in the office.  PLQ eye exam:06/27/2021 WNL Follow up in 1 year. [facility].  Primary osteoarthritis of both hands:  No tenderness or inflammation was noted.  Other fatigue: Stable  Age-related osteoporosis without current pathological fracture - DEXA updated on 09/16/20: The BMD measured at Femur Neck Right is 0.663 g/cm2 with a T-score of -2.7.  Fosamax was initiated in November 2022.  She is taking Fosamax 70 mg 1 tablet once weekly.  She is tolerating Fosamax without any side effects.  She is taking vitamin D 1000 units daily.  No falls or fractures recently.  Other medical conditions are listed as follows:  Essential hypertension  Hypercholesteremia  History of hypothyroidism  History of glaucoma  Orders: Orders Placed This Encounter  Procedures   Platelet count   No orders of the defined types were placed in this encounter.    Follow-Up Instructions: Return in about 5 months (around 03/05/2022) for Sjogren's syndrome, Osteoarthritis, Osteoporosis.   Ofilia Neas, PA-C  Note - This record has been created using Dragon software.  Chart creation errors have been sought, but may not always  have been located. Such creation errors do not reflect on  the standard of medical care.

## 2021-09-28 ENCOUNTER — Other Ambulatory Visit: Payer: Self-pay | Admitting: *Deleted

## 2021-09-28 DIAGNOSIS — E559 Vitamin D deficiency, unspecified: Secondary | ICD-10-CM | POA: Diagnosis not present

## 2021-09-28 DIAGNOSIS — Z79899 Other long term (current) drug therapy: Secondary | ICD-10-CM

## 2021-09-28 DIAGNOSIS — M810 Age-related osteoporosis without current pathological fracture: Secondary | ICD-10-CM

## 2021-09-28 DIAGNOSIS — R768 Other specified abnormal immunological findings in serum: Secondary | ICD-10-CM | POA: Diagnosis not present

## 2021-09-28 DIAGNOSIS — M3501 Sicca syndrome with keratoconjunctivitis: Secondary | ICD-10-CM

## 2021-09-30 NOTE — Progress Notes (Signed)
CBC normal, platelet count could not be performed as platelets were clumped.  We can check platelets next time.  Sodium is mildly elevated which is not significant.  Urine showed trace protein SPEP normal, RF negative, vitamin D normal, sed rate normal, complements normal double-stranded DNA negative.  ANA still pending.  Please notify patient once the ANA results are back.

## 2021-10-02 ENCOUNTER — Ambulatory Visit: Payer: Medicare Other | Admitting: Physician Assistant

## 2021-10-02 LAB — URINALYSIS, ROUTINE W REFLEX MICROSCOPIC
Bacteria, UA: NONE SEEN /HPF
Bilirubin Urine: NEGATIVE
Glucose, UA: NEGATIVE
Hgb urine dipstick: NEGATIVE
Hyaline Cast: NONE SEEN /LPF
Ketones, ur: NEGATIVE
Leukocytes,Ua: NEGATIVE
Nitrite: NEGATIVE
Specific Gravity, Urine: 1.019 (ref 1.001–1.035)
Squamous Epithelial / HPF: NONE SEEN /HPF (ref <=5)
pH: 5.5 (ref 5.0–8.0)

## 2021-10-02 LAB — VITAMIN D 25 HYDROXY (VIT D DEFICIENCY, FRACTURES): Vit D, 25-Hydroxy: 57 ng/mL (ref 30–100)

## 2021-10-02 LAB — CBC WITH DIFFERENTIAL/PLATELET
Absolute Monocytes: 546 cells/uL (ref 200–950)
Basophils Absolute: 39 cells/uL (ref 0–200)
Basophils Relative: 0.6 %
Eosinophils Absolute: 208 cells/uL (ref 15–500)
Eosinophils Relative: 3.2 %
HCT: 46.5 % — ABNORMAL HIGH (ref 35.0–45.0)
Hemoglobin: 14.8 g/dL (ref 11.7–15.5)
Lymphs Abs: 1710 cells/uL (ref 850–3900)
MCH: 28.7 pg (ref 27.0–33.0)
MCHC: 31.8 g/dL — ABNORMAL LOW (ref 32.0–36.0)
MCV: 90.3 fL (ref 80.0–100.0)
Monocytes Relative: 8.4 %
Neutro Abs: 3998 cells/uL (ref 1500–7800)
Neutrophils Relative %: 61.5 %
RBC: 5.15 10*6/uL — ABNORMAL HIGH (ref 3.80–5.10)
RDW: 12.3 % (ref 11.0–15.0)
Total Lymphocyte: 26.3 %
WBC: 6.5 10*3/uL (ref 3.8–10.8)

## 2021-10-02 LAB — COMPLETE METABOLIC PANEL WITH GFR
AG Ratio: 1.6 (calc) (ref 1.0–2.5)
ALT: 15 U/L (ref 6–29)
AST: 20 U/L (ref 10–35)
Albumin: 4.6 g/dL (ref 3.6–5.1)
Alkaline phosphatase (APISO): 52 U/L (ref 37–153)
BUN: 12 mg/dL (ref 7–25)
CO2: 27 mmol/L (ref 20–32)
Calcium: 10.1 mg/dL (ref 8.6–10.4)
Chloride: 109 mmol/L (ref 98–110)
Creat: 0.96 mg/dL (ref 0.50–1.05)
Globulin: 2.8 g/dL (calc) (ref 1.9–3.7)
Glucose, Bld: 89 mg/dL (ref 65–99)
Potassium: 4.5 mmol/L (ref 3.5–5.3)
Sodium: 147 mmol/L — ABNORMAL HIGH (ref 135–146)
Total Bilirubin: 1.5 mg/dL — ABNORMAL HIGH (ref 0.2–1.2)
Total Protein: 7.4 g/dL (ref 6.1–8.1)
eGFR: 64 mL/min/{1.73_m2} (ref >=60)

## 2021-10-02 LAB — ANTI-DNA ANTIBODY, DOUBLE-STRANDED: ds DNA Ab: 1 IU/mL

## 2021-10-02 LAB — PROTEIN ELECTROPHORESIS, SERUM, WITH REFLEX
Albumin ELP: 4.4 g/dL (ref 3.8–4.8)
Alpha 1: 0.2 g/dL (ref 0.2–0.3)
Alpha 2: 0.7 g/dL (ref 0.5–0.9)
Beta 2: 0.4 g/dL (ref 0.2–0.5)
Beta Globulin: 0.4 g/dL (ref 0.4–0.6)
Gamma Globulin: 0.9 g/dL (ref 0.8–1.7)
Total Protein: 7.1 g/dL (ref 6.1–8.1)

## 2021-10-02 LAB — ANTI-NUCLEAR AB-TITER (ANA TITER): ANA Titer 1: 1:320 {titer} — ABNORMAL HIGH

## 2021-10-02 LAB — C3 AND C4
C3 Complement: 157 mg/dL (ref 83–193)
C4 Complement: 30 mg/dL (ref 15–57)

## 2021-10-02 LAB — RHEUMATOID FACTOR: Rheumatoid fact SerPl-aCnc: <14 IU/mL (ref <14)

## 2021-10-02 LAB — MICROSCOPIC MESSAGE

## 2021-10-02 LAB — SEDIMENTATION RATE: Sed Rate: 2 mm/h (ref 0–30)

## 2021-10-02 LAB — ANA: Anti Nuclear Antibody (ANA): POSITIVE — AB

## 2021-10-02 NOTE — Progress Notes (Signed)
ANA titers are stable.

## 2021-10-03 ENCOUNTER — Encounter: Payer: Self-pay | Admitting: Physician Assistant

## 2021-10-03 ENCOUNTER — Ambulatory Visit (INDEPENDENT_AMBULATORY_CARE_PROVIDER_SITE_OTHER): Payer: Medicare Other | Admitting: Physician Assistant

## 2021-10-03 VITALS — BP 151/89 | HR 72 | Resp 12 | Ht 64.0 in | Wt 127.8 lb

## 2021-10-03 DIAGNOSIS — Z8669 Personal history of other diseases of the nervous system and sense organs: Secondary | ICD-10-CM | POA: Diagnosis not present

## 2021-10-03 DIAGNOSIS — I10 Essential (primary) hypertension: Secondary | ICD-10-CM

## 2021-10-03 DIAGNOSIS — M3501 Sicca syndrome with keratoconjunctivitis: Secondary | ICD-10-CM | POA: Diagnosis not present

## 2021-10-03 DIAGNOSIS — R5383 Other fatigue: Secondary | ICD-10-CM

## 2021-10-03 DIAGNOSIS — Z8639 Personal history of other endocrine, nutritional and metabolic disease: Secondary | ICD-10-CM | POA: Diagnosis not present

## 2021-10-03 DIAGNOSIS — M810 Age-related osteoporosis without current pathological fracture: Secondary | ICD-10-CM

## 2021-10-03 DIAGNOSIS — Z79899 Other long term (current) drug therapy: Secondary | ICD-10-CM | POA: Diagnosis not present

## 2021-10-03 DIAGNOSIS — R768 Other specified abnormal immunological findings in serum: Secondary | ICD-10-CM

## 2021-10-03 DIAGNOSIS — M19042 Primary osteoarthritis, left hand: Secondary | ICD-10-CM

## 2021-10-03 DIAGNOSIS — E7800 Pure hypercholesterolemia, unspecified: Secondary | ICD-10-CM | POA: Diagnosis not present

## 2021-10-03 DIAGNOSIS — M19041 Primary osteoarthritis, right hand: Secondary | ICD-10-CM | POA: Diagnosis not present

## 2021-11-08 ENCOUNTER — Telehealth: Payer: Self-pay | Admitting: *Deleted

## 2021-11-08 ENCOUNTER — Other Ambulatory Visit: Payer: Self-pay | Admitting: Physician Assistant

## 2021-11-08 NOTE — Telephone Encounter (Signed)
Submitted a Prior Authorization request to [employer] for  Plaquenil  via CoverMyMeds. Will update once we receive a response.

## 2021-11-08 NOTE — Telephone Encounter (Signed)
Next Visit: 03/08/2022  Last Visit: 10/03/2021  Labs: 09/28/2021 CBC normal, platelet count could not be performed as platelets were clumped.  We can check platelets next time.  Sodium is mildly elevated which is not significant.   Eye exam: 06/27/2021 WNL   Current Dose per office note 10/03/2021: Plaquenil 200 mg 1 tablet by mouth twice daily Monday to Friday.   PH:[id number] syndrome with keratoconjunctivitis sicca   Last Fill: 07/27/2021  Okay to refill Plaquenil?

## 2021-11-09 NOTE — Telephone Encounter (Signed)
Received a fax regarding Prior Authorization from [employer] for  Plaquenil . Authorization has been DENIED because Medicare does not support the approval of this medication as the FDA has not approved the use of medication for this diagnosis. FDA approved use for the treatment of uncomplicated malaria, malaria prophylaxis, treatment of Rheumatoid Arthritis and treatment of Lupus.

## 2021-11-13 DIAGNOSIS — H16223 Keratoconjunctivitis sicca, not specified as Sjogren's, bilateral: Secondary | ICD-10-CM | POA: Diagnosis not present

## 2021-11-13 DIAGNOSIS — H401132 Primary open-angle glaucoma, bilateral, moderate stage: Secondary | ICD-10-CM | POA: Diagnosis not present

## 2021-11-13 DIAGNOSIS — H25813 Combined forms of age-related cataract, bilateral: Secondary | ICD-10-CM | POA: Diagnosis not present

## 2021-11-14 IMAGING — CR DG ABDOMEN 1V
1 series · 1 of 1 positions shown · non-contrast
Comparison: Abdominal radiograph dated 08/04/2014 and CT of the
abdomen pelvis dated 03/30/2011.

CLINICAL DATA: 67-year-old female with history of renal stone.

EXAM:
ABDOMEN - 1 VIEW

[t abdomen supine]
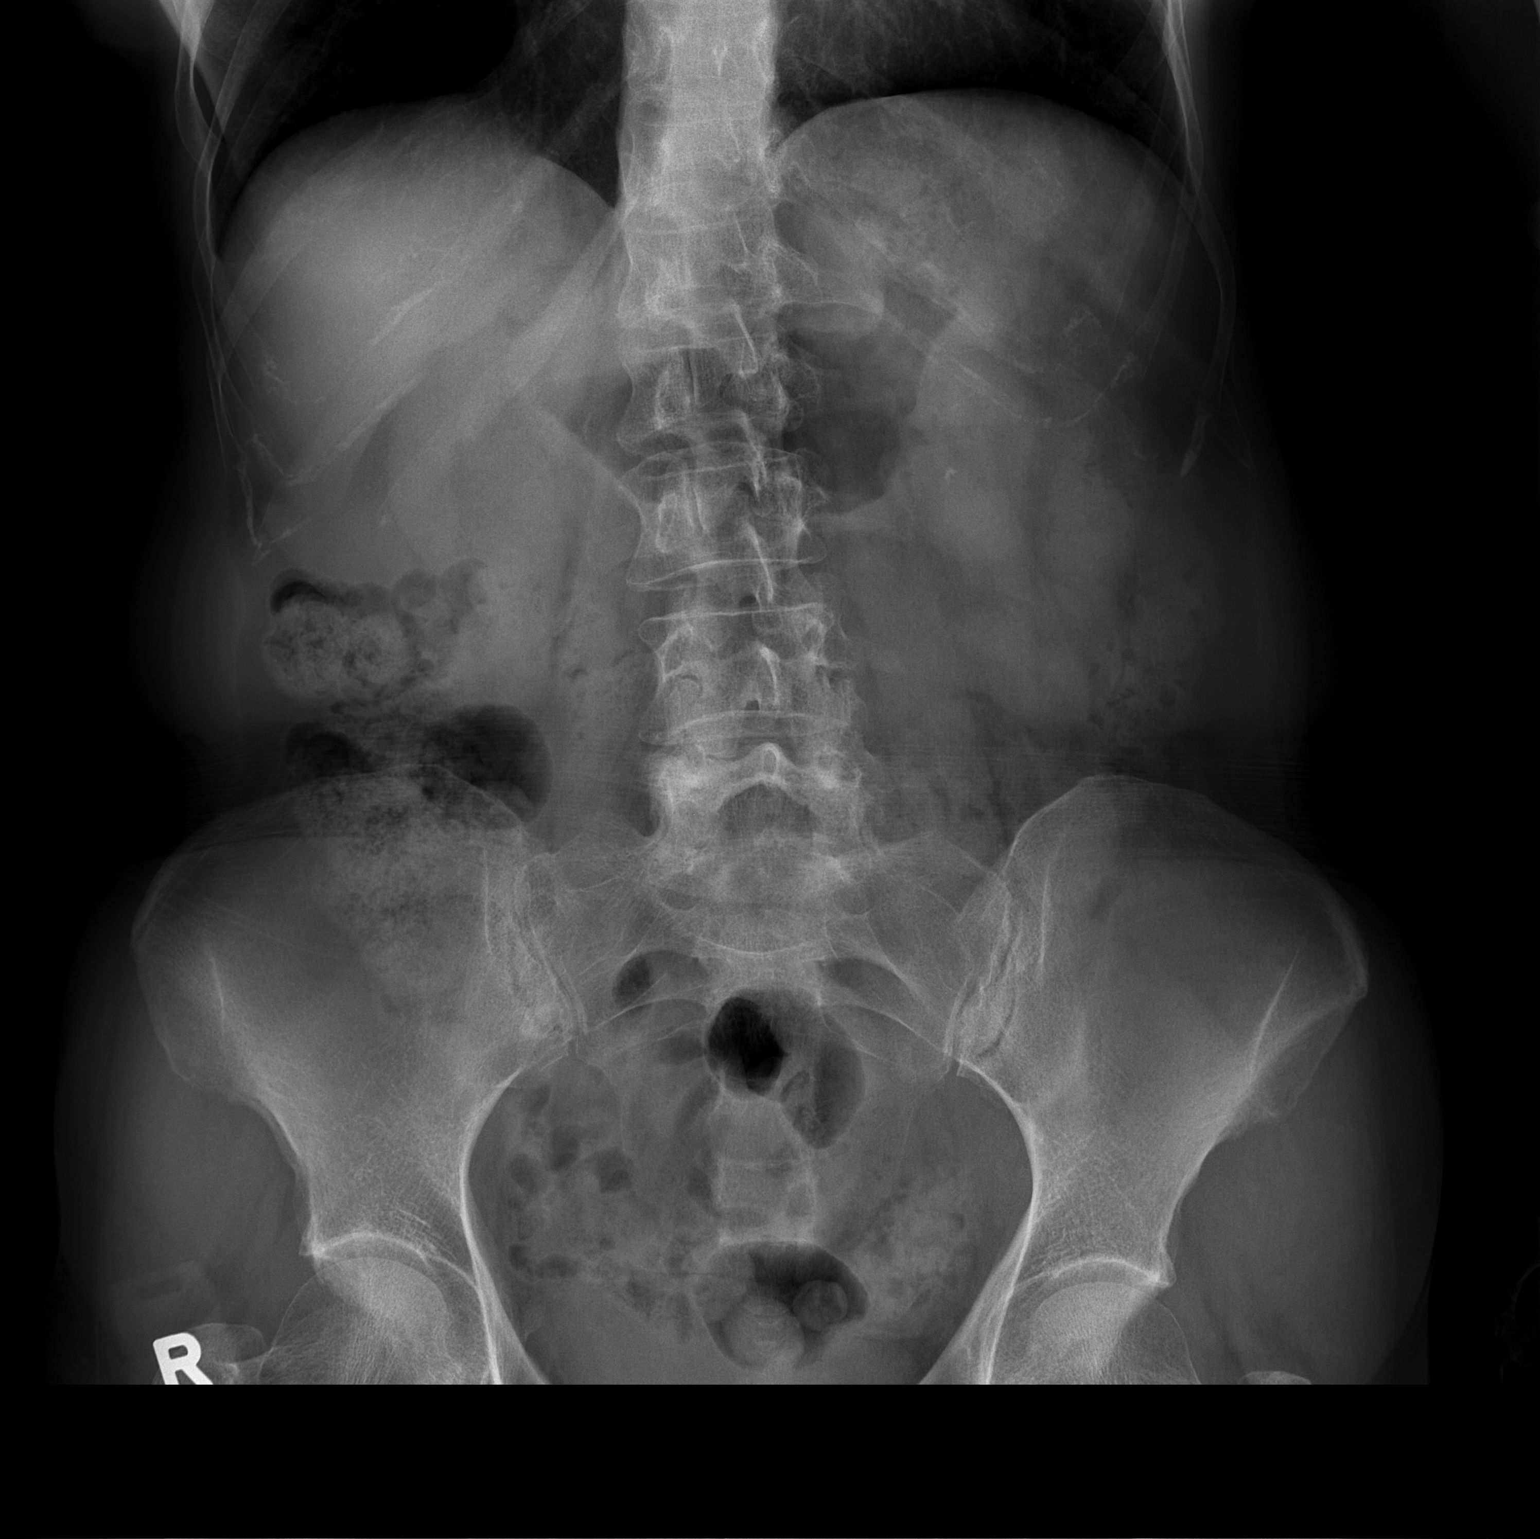

[1 of 1 positions shown; findings below may reference images not displayed]

FINDINGS: There is a 3 mm radiopaque focus over the left renal silhouette
similar to the radiograph of 08/04/2014. There is moderate stool
throughout the colon. No bowel dilatation or evidence of
obstruction. No free air. Degenerative changes of the spine. No
acute osseous pathology.
IMPRESSION: Probable 3 mm left renal calculus.

## 2021-11-27 DIAGNOSIS — H401132 Primary open-angle glaucoma, bilateral, moderate stage: Secondary | ICD-10-CM | POA: Diagnosis not present

## 2021-12-18 DIAGNOSIS — H25811 Combined forms of age-related cataract, right eye: Secondary | ICD-10-CM | POA: Diagnosis not present

## 2021-12-18 DIAGNOSIS — H25813 Combined forms of age-related cataract, bilateral: Secondary | ICD-10-CM | POA: Diagnosis not present

## 2021-12-27 DIAGNOSIS — E7800 Pure hypercholesterolemia, unspecified: Secondary | ICD-10-CM | POA: Diagnosis not present

## 2021-12-27 DIAGNOSIS — R7989 Other specified abnormal findings of blood chemistry: Secondary | ICD-10-CM | POA: Diagnosis not present

## 2021-12-27 DIAGNOSIS — R7303 Prediabetes: Secondary | ICD-10-CM | POA: Diagnosis not present

## 2021-12-27 DIAGNOSIS — I10 Essential (primary) hypertension: Secondary | ICD-10-CM | POA: Diagnosis not present

## 2021-12-27 DIAGNOSIS — M810 Age-related osteoporosis without current pathological fracture: Secondary | ICD-10-CM | POA: Diagnosis not present

## 2021-12-27 DIAGNOSIS — E039 Hypothyroidism, unspecified: Secondary | ICD-10-CM | POA: Diagnosis not present

## 2022-01-03 DIAGNOSIS — I10 Essential (primary) hypertension: Secondary | ICD-10-CM | POA: Diagnosis not present

## 2022-01-03 DIAGNOSIS — Z1331 Encounter for screening for depression: Secondary | ICD-10-CM | POA: Diagnosis not present

## 2022-01-03 DIAGNOSIS — I471 Supraventricular tachycardia: Secondary | ICD-10-CM | POA: Diagnosis not present

## 2022-01-03 DIAGNOSIS — E039 Hypothyroidism, unspecified: Secondary | ICD-10-CM | POA: Diagnosis not present

## 2022-01-03 DIAGNOSIS — E7800 Pure hypercholesterolemia, unspecified: Secondary | ICD-10-CM | POA: Diagnosis not present

## 2022-01-03 DIAGNOSIS — Z1339 Encounter for screening examination for other mental health and behavioral disorders: Secondary | ICD-10-CM | POA: Diagnosis not present

## 2022-01-03 DIAGNOSIS — M810 Age-related osteoporosis without current pathological fracture: Secondary | ICD-10-CM | POA: Diagnosis not present

## 2022-01-03 DIAGNOSIS — M3501 Sicca syndrome with keratoconjunctivitis: Secondary | ICD-10-CM | POA: Diagnosis not present

## 2022-01-03 DIAGNOSIS — M778 Other enthesopathies, not elsewhere classified: Secondary | ICD-10-CM | POA: Diagnosis not present

## 2022-01-03 DIAGNOSIS — Z0000 Encounter for general adult medical examination without abnormal findings: Secondary | ICD-10-CM | POA: Diagnosis not present

## 2022-01-03 DIAGNOSIS — M069 Rheumatoid arthritis, unspecified: Secondary | ICD-10-CM | POA: Diagnosis not present

## 2022-01-07 ENCOUNTER — Other Ambulatory Visit: Payer: Self-pay | Admitting: Physician Assistant

## 2022-01-23 DIAGNOSIS — H269 Unspecified cataract: Secondary | ICD-10-CM | POA: Diagnosis not present

## 2022-01-23 DIAGNOSIS — H25811 Combined forms of age-related cataract, right eye: Secondary | ICD-10-CM | POA: Diagnosis not present

## 2022-01-23 DIAGNOSIS — H409 Unspecified glaucoma: Secondary | ICD-10-CM | POA: Diagnosis not present

## 2022-01-23 DIAGNOSIS — H401132 Primary open-angle glaucoma, bilateral, moderate stage: Secondary | ICD-10-CM | POA: Diagnosis not present

## 2022-01-23 DIAGNOSIS — H401112 Primary open-angle glaucoma, right eye, moderate stage: Secondary | ICD-10-CM | POA: Diagnosis not present

## 2022-01-24 ENCOUNTER — Other Ambulatory Visit: Payer: Self-pay | Admitting: Physician Assistant

## 2022-01-24 NOTE — Telephone Encounter (Signed)
Next Visit: 03/08/2022   Last Visit: 10/03/2021   Labs: 09/28/2021 CBC normal, platelet count could not be performed as platelets were clumped.  We can check platelets next time.  Sodium is mildly elevated which is not significant.    Eye exam: 06/27/2021 WNL    Current Dose per office note 10/03/2021: Plaquenil 200 mg 1 tablet by mouth twice daily Monday to Friday.    IO:[id number] syndrome with keratoconjunctivitis sicca    Last Fill: 11/08/2021   Okay to refill Plaquenil?

## 2022-01-29 DIAGNOSIS — H25812 Combined forms of age-related cataract, left eye: Secondary | ICD-10-CM | POA: Diagnosis not present

## 2022-02-13 DIAGNOSIS — H269 Unspecified cataract: Secondary | ICD-10-CM | POA: Diagnosis not present

## 2022-02-13 DIAGNOSIS — H25812 Combined forms of age-related cataract, left eye: Secondary | ICD-10-CM | POA: Diagnosis not present

## 2022-02-13 DIAGNOSIS — H409 Unspecified glaucoma: Secondary | ICD-10-CM | POA: Diagnosis not present

## 2022-02-13 DIAGNOSIS — H401122 Primary open-angle glaucoma, left eye, moderate stage: Secondary | ICD-10-CM | POA: Diagnosis not present

## 2022-02-16 DIAGNOSIS — Z23 Encounter for immunization: Secondary | ICD-10-CM | POA: Diagnosis not present

## 2022-02-22 NOTE — Progress Notes (Signed)
Office Visit Note  Patient: Kathleen Mcclain             Date of Birth: 09/17/1950           MRN: [id number]             PCP: Michael Boston, MD Referring: Michael Boston, MD Visit Date: 03/08/2022 Occupation: '@GUAROCC'$ @  Subjective:  Left elbow and left hip pain  History of Present Illness: Kathleen Mcclain is a 71 y.o. female with history of Sjogren's and osteoarthritis.  She states she continues to have dry mouth and dry eyes.  She states the symptoms are mild and manageable with over-the-counter products.  She has been experiencing pain in her left elbow which she describes over the left lateral epicondyle region for the last 3 months.  She states the symptoms are intermittent.  She does not recall any strenuous activities or trauma.  She believes the pain comes from holding her iPad for long hours.  She also has pain and discomfort in her left hip joint off-and-on for the last 3 months.  The pain is worse after prolonged sitting.  None of the other joints are painful.  Activities of Daily Living:  Patient reports morning stiffness for 0 minutes.   Patient Denies nocturnal pain.  Difficulty dressing/grooming: Denies Difficulty climbing stairs: Denies Difficulty getting out of chair: Denies Difficulty using hands for taps, buttons, cutlery, and/or writing: Reports  Review of Systems  Constitutional:  Positive for fatigue.  HENT:  Positive for mouth dryness. Negative for mouth sores.   Eyes:  Positive for dryness.  Respiratory:  Negative for shortness of breath.   Cardiovascular:  Negative for chest pain and palpitations.  Gastrointestinal:  Negative for blood in stool, constipation and diarrhea.  Endocrine: Negative for increased urination.  Genitourinary:  Negative for involuntary urination.  Musculoskeletal:  Positive for joint pain, joint pain, myalgias, muscle tenderness and myalgias. Negative for gait problem, joint swelling, muscle weakness and morning stiffness.  Skin:   Positive for sensitivity to sunlight. Negative for color change, rash and hair loss.  Allergic/Immunologic: Negative for susceptible to infections.  Neurological:  Negative for dizziness and headaches.  Hematological:  Negative for swollen glands.  Psychiatric/Behavioral:  Positive for sleep disturbance. Negative for depressed mood. The patient is not nervous/anxious.     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   Cataract    Glaucoma    History of kidney stones    Hypercholesteremia    Hypothyroid    Hypothyroidism    Personal history of kidney stones    Pre-diabetes     Family History  Problem Relation Age of Onset   Alzheimer's disease Mother    Cancer Father        Pancreatic Cancer   Hyperlipidemia Father    Arthritis Maternal Grandmother    Diabetes Maternal Grandfather    Stroke Maternal Grandfather    Cancer  Paternal Grandmother    Cancer Paternal Grandfather    Thyroid disease Sister    Celiac disease Sister    Thyroid disease Brother    Anxiety disorder Son    Healthy Son    Healthy Daughter    Healthy Daughter    Past Surgical History:  Procedure Laterality Date   CATARACT EXTRACTION, BILATERAL     01/2022   CERVICAL BIOPSY  03/2006   pre cancerous cells   COLONOSCOPY     DACRORHINOCYSTOTOMY Left 08/31/2017   Procedure: DACROCYSTORHINOSTOMY (DCR);  Surgeon: Clista Bernhardt, MD;  Location: [facility];  Service: Ophthalmology;  Laterality: Left;    ETHMOIDECTOMY Left 08/31/2017   Procedure: ETHMOIDECTOMY, anterior;  Surgeon: Clista Bernhardt, MD;  Location: [place] [facility];  Service: Ophthalmology;  Laterality: Left;   EYE SURGERY Left    tear duct surgery, x4   LIPOMA EXCISION     on back    TEAR DUCT PROBING Left 02/20/2017   Procedure: nasolacrimal duct obstruct dacryocystorhinostomy left eye, anterior ephmoidectomy left eye, probe with stent left eye;  Surgeon: Clista Bernhardt, MD;  Location: [facility];  Service: Ophthalmology;  Laterality: Left;   TONSILLECTOMY     TUBAL LIGATION  08/1984   Social History   Social History Narrative   Does not work outside of home.    Has 2 sets of grandchildren--1 set lives in this area. 1 set lives at [employer] [facility].   Approximately 2013-- Was told "PreDiabetic"---She lost 30 pounds. Very careful with diet now. Does not exercise.   Immunization History  Administered Date(s) Administered   Fluad Quad(high Dose 65+) 01/20/2019, 01/19/2020   Influenza Inj Mdck Quad With Preservative 01/21/2018   Influenza,inj,Quad PF,6+ Mos 03/09/2015, 03/05/2016, 01/30/2017, 01/02/2018   Influenza-Unspecified 01/23/2012, 01/29/2013, 02/01/2014, 03/09/2015, 02/22/2016, 01/21/2017   PFIZER(Purple Top)SARS-COV-2 Vaccination 09/08/2019, 09/29/2019, 11/01/2020   Pneumococcal Conjugate-13 04/02/2018   Pneumococcal Polysaccharide-23 07/15/2019   Td 04/23/2005   Tdap 01/29/2013   Zoster, Live 03/09/2015     Objective: Vital Signs: BP (!) 149/76 (BP Location: Left Arm, Patient Position: Sitting, Cuff Size: Normal)   Pulse 80   Resp 14   Ht '5\' 4"'$  (1.626 m)   Wt 132 lb 12.8 oz (60.2 kg)   BMI 22.80 kg/m    Physical Exam Vitals and nursing note reviewed.  Constitutional:      Appearance: She is well-developed.  HENT:     Head: Normocephalic and atraumatic.  Eyes:     Conjunctiva/sclera: Conjunctivae normal.  Cardiovascular:     Rate and Rhythm: Normal rate and regular rhythm.     Heart sounds: Normal heart sounds.   Pulmonary:     Effort: Pulmonary effort is normal.     Breath sounds: Normal breath sounds.  Abdominal:     General: Bowel sounds are normal.     Palpations: Abdomen is soft.  Musculoskeletal:     Cervical back: Normal range of motion.  Lymphadenopathy:     Cervical: No cervical adenopathy.  Skin:    General: Skin is warm and dry.     Capillary Refill: Capillary refill takes less than 2 seconds.  Neurological:     Mental Status: She is alert and oriented to person, place, and time.  Psychiatric:        Behavior: Behavior normal.      Musculoskeletal Exam: Physical, thoracic and lumbar spine were in good range of motion.  She has postural kyphosis.  Shoulder joints, elbow joints, wrist joints were in good  range of motion.  She had tenderness over left lateral epicondyle region.  There was no synovitis over MCPs or PIPs.  Hip joints and knee joints with good range of motion.  She had no discomfort range of motion of the left hip joint.  There was no warmth swelling or effusion of her knee joints.  There was no tenderness over ankles or MTPs.  CDAI Exam: CDAI Score: -- Patient Global: --; Provider Global: -- Swollen: --; Tender: -- Joint Exam 03/08/2022   No joint exam has been documented for this visit   There is currently no information documented on the homunculus. Go to the Rheumatology activity and complete the homunculus joint exam.  Investigation: No additional findings.  Imaging: No results found.  Recent Labs: Lab Results  Component Value Date   WBC 7.6 03/01/2022   HGB 15.1 03/01/2022   PLT 239 03/01/2022   NA 141 03/01/2022   K 4.1 03/01/2022   CL 103 03/01/2022   CO2 25 03/01/2022   GLUCOSE 104 (H) 03/01/2022   BUN 14 03/01/2022   CREATININE 0.89 03/01/2022   BILITOT 1.5 (H) 03/01/2022   ALKPHOS 79 09/19/2016   AST 25 03/01/2022   ALT 19 03/01/2022   PROT 7.7 03/01/2022   ALBUMIN 4.3 09/19/2016   CALCIUM 10.3 03/01/2022   GFRAA 72 06/10/2020     Speciality Comments: PLQ eye exam:06/27/2021 WNL Follow up in 1 year. [facility]  Procedures:  No procedures performed Allergies: Penicillins and Alphagan [brimonidine]   Assessment / Plan:     Visit Diagnoses: Sjogren's syndrome with keratoconjunctivitis sicca ([facility]) - +ANA, positive centromere, negative Ro, negative La antibodies and sicca symptoms: Patient has mild sicca symptoms which are manageable with over-the-counter products.  She is uncertain if Plaquenil is making any difference in her symptoms.  She continues to take Plaquenil 200 mg twice daily Monday to Friday.  Over-the-counter products for dry mouth and dry eyes were reviewed.  No parotid swelling was noted.  Her chest was clear to auscultation.  She denies any history of shortness of breath or palpitations.  No synovitis was noted on the examination.  I will decrease her dose of hydroxychloroquine to 1 tablet p.o. daily.  Patient was in agreement.  Positive ANA (antinuclear antibody) - ANA 1: 640H, dsDNA >1000, and positive antithyroid peroxidase IgG, hx of fatigue, sicca sx, hair loss:  Positive double stranded DNA antibody test - AVISE dsDNA >1000.  She is no clinical features of systemic lupus at this time.  Double-stranded DNA negative on 06/10/2020, 11/02/2020, 05/02/21,09/28/2021, and March 01, 2022.  High risk medication use - Plaquenil 200 mg 1 tablet by mouth twice daily Monday to Friday. PLQ eye exam:06/27/2021.  She was advised to get labs in 5 months.  Information regarding medication was placed in the AVS.  Lateral epicondylitis, left elbow-most likely related to the use of iPad.  Use of topical Voltaren gel or Salonpas was discussed.  Primary osteoarthritis of both hands-trend reduction muscle pain was discussed.  Pain in left hip-she complain of off-and-on discomfort in her left hip joint after prolonged sitting.  She good range of motion of her hip joint.  There was no tenderness over trochanteric region.  I  advised her to contact us if her symptoms get worse.  Other fatigue-improved.  Age-related osteoporosis without current pathological fracture - DEXA updated on 09/16/20: The BMD measured at Femur Neck Right is 0.663 g/cm2 with a T-score of -2.7.  Fosamax was initiated in November  2022.  Use of calcium with vitamin D and resistive exercises were discussed.  Other medical problems are listed as follows:  History of hypothyroidism  Hypercholesteremia  Essential hypertension-pressure was elevated at 149/76.  Patient states that her blood pressure is always normal at home.  She was advised to monitor blood pressure twice a week at home and take the readings to her PCP.  Patient states that her blood pressure medication dose was recently increased.  History of glaucoma  Orders: No orders of the defined types were placed in this encounter.  Meds ordered this encounter  Medications   hydroxychloroquine (PLAQUENIL) 200 MG tablet    Sig: Take 1 tablet (200 mg total) by mouth daily.    Dispense:  90 tablet    Refill:  0     Follow-Up Instructions: Return in about 5 months (around 08/07/2022) for Sjogren's, Osteoarthritis.   Bo Merino, MD  Note - This record has been created using Editor, commissioning.  Chart creation errors have been sought, but may not always  have been located. Such creation errors do not reflect on  the standard of medical care.

## 2022-03-01 ENCOUNTER — Other Ambulatory Visit: Payer: Self-pay | Admitting: *Deleted

## 2022-03-01 DIAGNOSIS — Z79899 Other long term (current) drug therapy: Secondary | ICD-10-CM

## 2022-03-01 DIAGNOSIS — M3501 Sicca syndrome with keratoconjunctivitis: Secondary | ICD-10-CM | POA: Diagnosis not present

## 2022-03-04 LAB — COMPLETE METABOLIC PANEL WITH GFR
AG Ratio: 1.8 (calc) (ref 1.0–2.5)
ALT: 19 U/L (ref 6–29)
AST: 25 U/L (ref 10–35)
Albumin: 4.9 g/dL (ref 3.6–5.1)
Alkaline phosphatase (APISO): 63 U/L (ref 37–153)
BUN: 14 mg/dL (ref 7–25)
CO2: 25 mmol/L (ref 20–32)
Calcium: 10.3 mg/dL (ref 8.6–10.4)
Chloride: 103 mmol/L (ref 98–110)
Creat: 0.89 mg/dL (ref 0.50–1.05)
Globulin: 2.8 g/dL (calc) (ref 1.9–3.7)
Glucose, Bld: 104 mg/dL — ABNORMAL HIGH (ref 65–99)
Potassium: 4.1 mmol/L (ref 3.5–5.3)
Sodium: 141 mmol/L (ref 135–146)
Total Bilirubin: 1.5 mg/dL — ABNORMAL HIGH (ref 0.2–1.2)
Total Protein: 7.7 g/dL (ref 6.1–8.1)
eGFR: 71 mL/min/{1.73_m2} (ref >=60)

## 2022-03-04 LAB — URINALYSIS, ROUTINE W REFLEX MICROSCOPIC
Bacteria, UA: NONE SEEN /HPF
Bilirubin Urine: NEGATIVE
Glucose, UA: NEGATIVE
Hgb urine dipstick: NEGATIVE
Hyaline Cast: NONE SEEN /LPF
Ketones, ur: NEGATIVE
Nitrite: NEGATIVE
Protein, ur: NEGATIVE
RBC / HPF: NONE SEEN /HPF (ref 0–2)
Specific Gravity, Urine: 1.006 (ref 1.001–1.035)
Squamous Epithelial / HPF: NONE SEEN /HPF (ref <=5)
WBC, UA: NONE SEEN /HPF (ref 0–5)
pH: 7.5 (ref 5.0–8.0)

## 2022-03-04 LAB — CBC WITH DIFFERENTIAL/PLATELET
Absolute Monocytes: 524 cells/uL (ref 200–950)
Basophils Absolute: 53 cells/uL (ref 0–200)
Basophils Relative: 0.7 %
Eosinophils Absolute: 152 cells/uL (ref 15–500)
Eosinophils Relative: 2 %
HCT: 44.8 % (ref 35.0–45.0)
Hemoglobin: 15.1 g/dL (ref 11.7–15.5)
Lymphs Abs: 1664 cells/uL (ref 850–3900)
MCH: 29.4 pg (ref 27.0–33.0)
MCHC: 33.7 g/dL (ref 32.0–36.0)
MCV: 87.2 fL (ref 80.0–100.0)
MPV: 12.1 fL (ref 7.5–12.5)
Monocytes Relative: 6.9 %
Neutro Abs: 5206 cells/uL (ref 1500–7800)
Neutrophils Relative %: 68.5 %
Platelets: 239 10*3/uL (ref 140–400)
RBC: 5.14 10*6/uL — ABNORMAL HIGH (ref 3.80–5.10)
RDW: 12.7 % (ref 11.0–15.0)
Total Lymphocyte: 21.9 %
WBC: 7.6 10*3/uL (ref 3.8–10.8)

## 2022-03-04 LAB — PROTEIN / CREATININE RATIO, URINE
Creatinine, Urine: 36 mg/dL (ref 20–275)
Protein/Creat Ratio: 139 mg/g creat (ref 24–184)
Protein/Creatinine Ratio: 0.139 mg/mg creat (ref 0.024–0.184)
Total Protein, Urine: 5 mg/dL (ref 5–24)

## 2022-03-04 LAB — MICROSCOPIC MESSAGE

## 2022-03-04 LAB — ANTI-NUCLEAR AB-TITER (ANA TITER)
ANA TITER: 1:80 {titer} — ABNORMAL HIGH
ANA Titer 1: 1:320 {titer} — ABNORMAL HIGH

## 2022-03-04 LAB — C3 AND C4
C3 Complement: 152 mg/dL (ref 83–193)
C4 Complement: 29 mg/dL (ref 15–57)

## 2022-03-04 LAB — SEDIMENTATION RATE: Sed Rate: 6 mm/h (ref 0–30)

## 2022-03-04 LAB — ANA: Anti Nuclear Antibody (ANA): POSITIVE — AB

## 2022-03-04 LAB — ANTI-DNA ANTIBODY, DOUBLE-STRANDED: ds DNA Ab: <1 IU/mL

## 2022-03-04 NOTE — Progress Notes (Signed)
I will discuss results at the follow-up visit.

## 2022-03-05 DIAGNOSIS — H401122 Primary open-angle glaucoma, left eye, moderate stage: Secondary | ICD-10-CM | POA: Diagnosis not present

## 2022-03-05 DIAGNOSIS — H401112 Primary open-angle glaucoma, right eye, moderate stage: Secondary | ICD-10-CM | POA: Diagnosis not present

## 2022-03-08 ENCOUNTER — Ambulatory Visit: Payer: Medicare Other | Attending: Rheumatology | Admitting: Rheumatology

## 2022-03-08 ENCOUNTER — Encounter: Payer: Self-pay | Admitting: Rheumatology

## 2022-03-08 VITALS — BP 149/76 | HR 80 | Resp 14 | Ht 64.0 in | Wt 132.8 lb

## 2022-03-08 DIAGNOSIS — E7800 Pure hypercholesterolemia, unspecified: Secondary | ICD-10-CM | POA: Insufficient documentation

## 2022-03-08 DIAGNOSIS — I10 Essential (primary) hypertension: Secondary | ICD-10-CM

## 2022-03-08 DIAGNOSIS — M25552 Pain in left hip: Secondary | ICD-10-CM | POA: Diagnosis not present

## 2022-03-08 DIAGNOSIS — Z8639 Personal history of other endocrine, nutritional and metabolic disease: Secondary | ICD-10-CM | POA: Diagnosis not present

## 2022-03-08 DIAGNOSIS — M3501 Sicca syndrome with keratoconjunctivitis: Secondary | ICD-10-CM | POA: Insufficient documentation

## 2022-03-08 DIAGNOSIS — M19041 Primary osteoarthritis, right hand: Secondary | ICD-10-CM | POA: Insufficient documentation

## 2022-03-08 DIAGNOSIS — R5383 Other fatigue: Secondary | ICD-10-CM | POA: Diagnosis not present

## 2022-03-08 DIAGNOSIS — M7712 Lateral epicondylitis, left elbow: Secondary | ICD-10-CM | POA: Diagnosis not present

## 2022-03-08 DIAGNOSIS — Z8669 Personal history of other diseases of the nervous system and sense organs: Secondary | ICD-10-CM | POA: Insufficient documentation

## 2022-03-08 DIAGNOSIS — Z79899 Other long term (current) drug therapy: Secondary | ICD-10-CM

## 2022-03-08 DIAGNOSIS — M19042 Primary osteoarthritis, left hand: Secondary | ICD-10-CM

## 2022-03-08 DIAGNOSIS — M810 Age-related osteoporosis without current pathological fracture: Secondary | ICD-10-CM | POA: Diagnosis not present

## 2022-03-08 DIAGNOSIS — R768 Other specified abnormal immunological findings in serum: Secondary | ICD-10-CM | POA: Insufficient documentation

## 2022-03-08 MED ORDER — HYDROXYCHLOROQUINE SULFATE 200 MG PO TABS: 200.0000 mg | ORAL_TABLET | Freq: Every day | ORAL | 0 refills | Status: DC

## 2022-03-08 NOTE — Patient Instructions (Signed)
Standing Labs We placed an order today for your standing lab work.   Please have your standing labs drawn in April  Please have your labs drawn 2 weeks prior to your appointment so that the provider can discuss your lab results at your appointment.  Please note that you may see your imaging and lab results in [facility] before we have reviewed them. We will contact you once all results are reviewed. Please allow our office up to 72 hours to thoroughly review all of the results before contacting the office for clarification of your results.  Lab hours are:   Monday through Thursday from 8:00 am -12:30 pm and 1:00 pm-5:00 pm and Friday from 8:00 am-12:00 pm.  Please be advised, all patients with office appointments requiring lab work will take precedent over walk-in lab work.   Labs are drawn by [facility]. Please bring your co-pay at the time of your lab draw.  You may receive a bill from [facility] for your lab work.  Please note if you are on Hydroxychloroquine and and an order has been placed for a Hydroxychloroquine level, you will need to have it drawn 4 hours or more after your last dose.  If you wish to have your labs drawn at another location, please call the office 24 hours in advance so we can fax the orders.  The office is located at [address] [place], [place], [place] [address] No appointment is necessary.    If you have any questions regarding directions or hours of operation,  please call [phone].   As a reminder, please drink plenty of water prior to coming for your lab work. Thanks!   Vaccines You are taking a medication(s) that can suppress your immune system.  The following immunizations are recommended: Flu annually Covid-19  Td/Tdap (tetanus, diphtheria, pertussis) every 10 years Pneumonia (Prevnar 15 then Pneumovax 23 at least 1 year apart.  Alternatively, can take Prevnar 20 without needing additional dose) Shingrix: 2 doses from 4 weeks to 6 months  apart  Please check with your PCP to make sure you are up to date.

## 2022-03-28 ENCOUNTER — Telehealth: Payer: Self-pay

## 2022-03-28 NOTE — Telephone Encounter (Signed)
Patient called stating she is having difficultly getting plaquenil refilled. Prescription was sent to the pharmacy on 03/08/2022 for a 90 day supply. Patient is requesting at least a 30 day supply because her insurance is changing on 04/23/2022. The pharmacy can fill a 30 day supply for $30 on a discount card. I called the patient and advised. Patient verbalized understanding.

## 2022-06-22 ENCOUNTER — Other Ambulatory Visit: Payer: Self-pay | Admitting: Rheumatology

## 2022-06-22 NOTE — Telephone Encounter (Signed)
Next Visit: 08/08/2022  Last Visit: 03/08/2022  Labs: 03/01/2022 Glucose 104 Total Bilirubin 1.5 RBC 5.14  Eye exam: 06/27/2021 WNL   Current Dose per office note 03/08/2022: Plaquenil 200 mg 1 tablet by mouth twice daily Monday to Friday   [id number] syndrome with keratoconjunctivitis sicca   Last Fill: 03/08/2022  Notified patient via mychart to update PLQ Eye Exam.  Okay to refill Plaquenil?

## 2022-06-29 DIAGNOSIS — I471 Supraventricular tachycardia, unspecified: Secondary | ICD-10-CM | POA: Diagnosis not present

## 2022-06-29 DIAGNOSIS — I10 Essential (primary) hypertension: Secondary | ICD-10-CM | POA: Diagnosis not present

## 2022-06-29 DIAGNOSIS — M3501 Sicca syndrome with keratoconjunctivitis: Secondary | ICD-10-CM | POA: Diagnosis not present

## 2022-06-29 DIAGNOSIS — M810 Age-related osteoporosis without current pathological fracture: Secondary | ICD-10-CM | POA: Diagnosis not present

## 2022-06-29 DIAGNOSIS — M069 Rheumatoid arthritis, unspecified: Secondary | ICD-10-CM | POA: Diagnosis not present

## 2022-07-25 NOTE — Progress Notes (Unsigned)
Office Visit Note  Patient: Kathleen Mcclain             Date of Birth: Mar 28, 1951           MRN: [id number]             PCP: Melida Quitter, MD Referring: Melida Quitter, MD Visit Date: 08/08/2022 Occupation: @GUAROCC @  Subjective:  Discuss lab results   History of Present Illness: Kathleen Mcclain is a 72 y.o. female with history of sjogren's syndrome, osteoarthritis, and DDD.  She is taking plaquenil 200 mg 1 tablet by mouth twice daily Monday through Friday.    Patient had updated lab work on 08/01/22: ANA remains positive, dsDNA negative, complements WNL, ESR WNL, and protein creatinine ratio WNL.    PLQ eye exam:06/27/2021 WNL Follow up in 1 year. [facility]  CBC and CMP updated on 08/01/22.     Activities of Daily Living:  Patient reports morning stiffness for *** {minute/hour:19697}.   Patient {ACTIONS;DENIES/REPORTS:21021675::"Denies"} nocturnal pain.  Difficulty dressing/grooming: {ACTIONS;DENIES/REPORTS:21021675::"Denies"} Difficulty climbing stairs: {ACTIONS;DENIES/REPORTS:21021675::"Denies"} Difficulty getting out of chair: {ACTIONS;DENIES/REPORTS:21021675::"Denies"} Difficulty using hands for taps, buttons, cutlery, and/or writing: {ACTIONS;DENIES/REPORTS:21021675::"Denies"}  No Rheumatology ROS completed.   PMFS History:  Patient Active Problem List   Diagnosis Date Noted   Sjogren's syndrome with keratoconjunctivitis sicca 06/20/2020   Hepatic steatosis 04/02/2018   Elevated liver function tests 04/02/2018   Autoimmune disease 04/02/2018   Essential hypertension 03/21/2017   Hypothyroidism 03/09/2015   Hyperglycemia 03/09/2015   Allergy    Cataract    Glaucoma    Hypercholesteremia    Personal history of kidney stones     Past Medical History:  Diagnosis Date   Allergy    Arthritis    Cancer ([facility])    pre cancer cells cervical   Cataract    Glaucoma    History of kidney stones    Hypercholesteremia    Hypothyroid    Hypothyroidism     Personal history of kidney stones    Pre-diabetes     Family History  Problem Relation Age of Onset   Alzheimer's disease Mother    Cancer Father        Pancreatic Cancer   Hyperlipidemia Father    Arthritis Maternal Grandmother    Diabetes Maternal Grandfather    Stroke Maternal Grandfather    Cancer Paternal Grandmother    Cancer Paternal Grandfather    Thyroid disease Sister    Celiac disease Sister    Thyroid disease Brother    Anxiety disorder Son    Healthy Son    Healthy Daughter    Healthy Daughter    Past Surgical History:  Procedure Laterality Date   CATARACT EXTRACTION, BILATERAL     01/2022   CERVICAL BIOPSY  03/2006   pre cancerous cells   COLONOSCOPY     DACRORHINOCYSTOTOMY Left 08/31/2017   Procedure: DACROCYSTORHINOSTOMY (DCR);  Surgeon: Floydene Flock, MD;  Location: [facility] OR;  Service: Ophthalmology;  Laterality: Left;   ETHMOIDECTOMY Left 08/31/2017   Procedure: ETHMOIDECTOMY, anterior;  Surgeon: Floydene Flock, MD;  Location: [facility];  Service: Ophthalmology;  Laterality: Left;   EYE SURGERY Left    tear duct surgery, x4   LIPOMA EXCISION     on back    TEAR DUCT PROBING Left 02/20/2017   Procedure: nasolacrimal duct obstruct dacryocystorhinostomy left eye, anterior ephmoidectomy left eye, probe with stent left eye;  Surgeon: Floydene Flock, MD;  Location: [facility] OR;  Service:  Ophthalmology;  Laterality: Left;   TONSILLECTOMY     TUBAL LIGATION  08/1984   Social History   Social History Narrative   Does not work outside of home.    Has 2 sets of grandchildren--1 set lives in this area. 1 set lives at [facility].   Approximately 2013-- Was told "PreDiabetic"---She lost 30 pounds. Very careful with diet now. Does not exercise.   Immunization History  Administered Date(s) Administered   Fluad Quad(high Dose 65+) 01/20/2019, 01/19/2020   Influenza Inj Mdck Quad With Preservative 01/21/2018   Influenza,inj,Quad PF,6+ Mos 03/09/2015, 03/05/2016,  01/30/2017, 01/02/2018   Influenza-Unspecified 01/23/2012, 01/29/2013, 02/01/2014, 03/09/2015, 02/22/2016, 01/21/2017   PFIZER(Purple Top)SARS-COV-2 Vaccination 09/08/2019, 09/29/2019, 11/01/2020   Pneumococcal Conjugate-13 04/02/2018   Pneumococcal Polysaccharide-23 07/15/2019   Td 04/23/2005   Tdap 01/29/2013   Zoster, Live 03/09/2015     Objective: Vital Signs: There were no vitals taken for this visit.   Physical Exam Vitals and nursing note reviewed.  Constitutional:      Appearance: She is well-developed.  HENT:     Head: Normocephalic and atraumatic.  Eyes:     Conjunctiva/sclera: Conjunctivae normal.  Cardiovascular:     Rate and Rhythm: Normal rate and regular rhythm.     Heart sounds: Normal heart sounds.  Pulmonary:     Effort: Pulmonary effort is normal.     Breath sounds: Normal breath sounds.  Abdominal:     General: Bowel sounds are normal.     Palpations: Abdomen is soft.  Musculoskeletal:     Cervical back: Normal range of motion.  Lymphadenopathy:     Cervical: No cervical adenopathy.  Skin:    General: Skin is warm and dry.     Capillary Refill: Capillary refill takes less than 2 seconds.  Neurological:     Mental Status: She is alert and oriented to person, place, and time.  Psychiatric:        Behavior: Behavior normal.      Musculoskeletal Exam: ***  CDAI Exam: CDAI Score: -- Patient Global: --; Provider Global: -- Swollen: --; Tender: -- Joint Exam 08/08/2022   No joint exam has been documented for this visit   There is currently no information documented on the homunculus. Go to the Rheumatology activity and complete the homunculus joint exam.  Investigation: No additional findings.  Imaging: No results found.  Recent Labs: Lab Results  Component Value Date   WBC 7.6 03/01/2022   HGB 15.1 03/01/2022   PLT 239 03/01/2022   NA 141 03/01/2022   K 4.1 03/01/2022   CL 103 03/01/2022   CO2 25 03/01/2022   GLUCOSE 104 (H)  03/01/2022   BUN 14 03/01/2022   CREATININE 0.89 03/01/2022   BILITOT 1.5 (H) 03/01/2022   ALKPHOS 79 09/19/2016   AST 25 03/01/2022   ALT 19 03/01/2022   PROT 7.7 03/01/2022   ALBUMIN 4.3 09/19/2016   CALCIUM 10.3 03/01/2022   GFRAA 72 06/10/2020    Speciality Comments: PLQ eye exam:06/27/2021 WNL Follow up in 1 year. [facility]  Procedures:  No procedures performed Allergies: Penicillins and Alphagan [brimonidine]   Assessment / Plan:     Visit Diagnoses: Sjogren's syndrome with keratoconjunctivitis sicca  Positive ANA (antinuclear antibody)  Positive double stranded DNA antibody test  High risk medication use  Lateral epicondylitis, left elbow  Primary osteoarthritis of both hands  Pain in left hip  Other fatigue  Age-related osteoporosis without current pathological fracture  Vitamin D deficiency  History of hypothyroidism  Hypercholesteremia  Essential hypertension  History of glaucoma  Orders: No orders of the defined types were placed in this encounter.  No orders of the defined types were placed in this encounter.   Face-to-face time spent with patient was *** minutes. Greater than 50% of time was spent in counseling and coordination of care.  Follow-Up Instructions: No follow-ups on file.   Gearldine Bienenstock, PA-C  Note - This record has been created using Dragon software.  Chart creation errors have been sought, but may not always  have been located. Such creation errors do not reflect on  the standard of medical care.

## 2022-07-31 DIAGNOSIS — H2513 Age-related nuclear cataract, bilateral: Secondary | ICD-10-CM | POA: Diagnosis not present

## 2022-07-31 DIAGNOSIS — Z79899 Other long term (current) drug therapy: Secondary | ICD-10-CM | POA: Diagnosis not present

## 2022-07-31 DIAGNOSIS — H401132 Primary open-angle glaucoma, bilateral, moderate stage: Secondary | ICD-10-CM | POA: Diagnosis not present

## 2022-08-01 ENCOUNTER — Other Ambulatory Visit: Payer: Self-pay | Admitting: *Deleted

## 2022-08-01 DIAGNOSIS — M3501 Sicca syndrome with keratoconjunctivitis: Secondary | ICD-10-CM

## 2022-08-01 DIAGNOSIS — Z79899 Other long term (current) drug therapy: Secondary | ICD-10-CM | POA: Diagnosis not present

## 2022-08-01 DIAGNOSIS — R768 Other specified abnormal immunological findings in serum: Secondary | ICD-10-CM

## 2022-08-01 LAB — CBC WITH DIFFERENTIAL/PLATELET: Platelets: 255 10*3/uL (ref 140–400)

## 2022-08-02 LAB — CBC WITH DIFFERENTIAL/PLATELET
Lymphs Abs: 1542 cells/uL (ref 850–3900)
MPV: 12.2 fL (ref 7.5–12.5)
RDW: 12.5 % (ref 11.0–15.0)

## 2022-08-02 LAB — COMPLETE METABOLIC PANEL WITH GFR
Alkaline phosphatase (APISO): 54 U/L (ref 37–153)
Globulin: 2.3 g/dL (calc) (ref 1.9–3.7)
Sodium: 144 mmol/L (ref 135–146)

## 2022-08-02 LAB — PROTEIN / CREATININE RATIO, URINE: Protein/Creatinine Ratio: 0.089 mg/mg creat (ref 0.024–0.184)

## 2022-08-03 LAB — PROTEIN / CREATININE RATIO, URINE
Creatinine, Urine: 56 mg/dL (ref 20–275)
Protein/Creat Ratio: 89 mg/g creat (ref 24–184)
Total Protein, Urine: 5 mg/dL (ref 5–24)

## 2022-08-03 LAB — CBC WITH DIFFERENTIAL/PLATELET
Absolute Monocytes: 589 cells/uL (ref 200–950)
Basophils Absolute: 51 cells/uL (ref 0–200)
Basophils Relative: 0.8 %
Eosinophils Absolute: 179 cells/uL (ref 15–500)
Eosinophils Relative: 2.8 %
HCT: 43.4 % (ref 35.0–45.0)
Hemoglobin: 14.2 g/dL (ref 11.7–15.5)
MCH: 29 pg (ref 27.0–33.0)
MCHC: 32.7 g/dL (ref 32.0–36.0)
MCV: 88.6 fL (ref 80.0–100.0)
Monocytes Relative: 9.2 %
Neutro Abs: 4038 cells/uL (ref 1500–7800)
Neutrophils Relative %: 63.1 %
RBC: 4.9 10*6/uL (ref 3.80–5.10)
Total Lymphocyte: 24.1 %
WBC: 6.4 10*3/uL (ref 3.8–10.8)

## 2022-08-03 LAB — COMPLETE METABOLIC PANEL WITH GFR
AG Ratio: 2 (calc) (ref 1.0–2.5)
ALT: 19 U/L (ref 6–29)
AST: 22 U/L (ref 10–35)
Albumin: 4.5 g/dL (ref 3.6–5.1)
BUN: 13 mg/dL (ref 7–25)
CO2: 30 mmol/L (ref 20–32)
Calcium: 9.8 mg/dL (ref 8.6–10.4)
Chloride: 107 mmol/L (ref 98–110)
Creat: 0.79 mg/dL (ref 0.50–1.05)
Glucose, Bld: 98 mg/dL (ref 65–99)
Potassium: 4.4 mmol/L (ref 3.5–5.3)
Total Bilirubin: 1.3 mg/dL — ABNORMAL HIGH (ref 0.2–1.2)
Total Protein: 6.8 g/dL (ref 6.1–8.1)
eGFR: 81 mL/min/{1.73_m2} (ref >=60)

## 2022-08-03 LAB — URINALYSIS, ROUTINE W REFLEX MICROSCOPIC
Bilirubin Urine: NEGATIVE
Glucose, UA: NEGATIVE
Hgb urine dipstick: NEGATIVE
Ketones, ur: NEGATIVE
Leukocytes,Ua: NEGATIVE
Nitrite: NEGATIVE
Protein, ur: NEGATIVE
Specific Gravity, Urine: 1.012 (ref 1.001–1.035)
pH: 7.5 (ref 5.0–8.0)

## 2022-08-03 LAB — ANA: Anti Nuclear Antibody (ANA): POSITIVE — AB

## 2022-08-03 LAB — ANTI-DNA ANTIBODY, DOUBLE-STRANDED: ds DNA Ab: <1 [IU]/mL

## 2022-08-03 LAB — C3 AND C4
C3 Complement: 138 mg/dL (ref 83–193)
C4 Complement: 23 mg/dL (ref 15–57)

## 2022-08-03 LAB — SEDIMENTATION RATE: Sed Rate: 2 mm/h (ref 0–30)

## 2022-08-03 LAB — ANTI-NUCLEAR AB-TITER (ANA TITER): ANA Titer 1: 1:320 {titer} — ABNORMAL HIGH

## 2022-08-03 NOTE — Progress Notes (Signed)
ANA positive and stable titer.  All other labs are within normal limits.  Will discuss results at the follow-up visit on April 17.

## 2022-08-06 DIAGNOSIS — H401132 Primary open-angle glaucoma, bilateral, moderate stage: Secondary | ICD-10-CM | POA: Diagnosis not present

## 2022-08-08 ENCOUNTER — Ambulatory Visit: Payer: Medicare Other | Attending: Physician Assistant | Admitting: Physician Assistant

## 2022-08-08 ENCOUNTER — Encounter: Payer: Self-pay | Admitting: Physician Assistant

## 2022-08-08 VITALS — BP 138/83 | HR 81 | Resp 15 | Ht 64.0 in | Wt 135.0 lb

## 2022-08-08 DIAGNOSIS — Z79899 Other long term (current) drug therapy: Secondary | ICD-10-CM | POA: Insufficient documentation

## 2022-08-08 DIAGNOSIS — M25552 Pain in left hip: Secondary | ICD-10-CM | POA: Insufficient documentation

## 2022-08-08 DIAGNOSIS — I10 Essential (primary) hypertension: Secondary | ICD-10-CM | POA: Diagnosis not present

## 2022-08-08 DIAGNOSIS — M19042 Primary osteoarthritis, left hand: Secondary | ICD-10-CM | POA: Diagnosis not present

## 2022-08-08 DIAGNOSIS — M19041 Primary osteoarthritis, right hand: Secondary | ICD-10-CM | POA: Insufficient documentation

## 2022-08-08 DIAGNOSIS — M810 Age-related osteoporosis without current pathological fracture: Secondary | ICD-10-CM | POA: Diagnosis not present

## 2022-08-08 DIAGNOSIS — R768 Other specified abnormal immunological findings in serum: Secondary | ICD-10-CM | POA: Diagnosis not present

## 2022-08-08 DIAGNOSIS — E7800 Pure hypercholesterolemia, unspecified: Secondary | ICD-10-CM | POA: Diagnosis not present

## 2022-08-08 DIAGNOSIS — Z8639 Personal history of other endocrine, nutritional and metabolic disease: Secondary | ICD-10-CM | POA: Diagnosis not present

## 2022-08-08 DIAGNOSIS — Z8669 Personal history of other diseases of the nervous system and sense organs: Secondary | ICD-10-CM | POA: Diagnosis not present

## 2022-08-08 DIAGNOSIS — M3501 Sicca syndrome with keratoconjunctivitis: Secondary | ICD-10-CM | POA: Insufficient documentation

## 2022-08-08 DIAGNOSIS — M7712 Lateral epicondylitis, left elbow: Secondary | ICD-10-CM | POA: Insufficient documentation

## 2022-08-08 DIAGNOSIS — E559 Vitamin D deficiency, unspecified: Secondary | ICD-10-CM | POA: Diagnosis not present

## 2022-08-08 DIAGNOSIS — R5383 Other fatigue: Secondary | ICD-10-CM | POA: Insufficient documentation

## 2022-09-20 ENCOUNTER — Other Ambulatory Visit: Payer: Self-pay | Admitting: Rheumatology

## 2022-09-20 NOTE — Telephone Encounter (Signed)
Last Fill: 06/22/2022  Eye exam: 07/31/2022  Labs: 08/01/2022 ANA positive and stable titer.  All other labs are within normal limits.   Next Visit: 01/22/2023  Last Visit: 08/08/2022  [id number] syndrome with keratoconjunctivitis sicca   Current Dose per office note on 08/08/2022: Plaquenil 200 mg 1 tablet by mouth by mouth daily.   Okay to refill Plaquenil?

## 2022-09-24 DIAGNOSIS — Z01419 Encounter for gynecological examination (general) (routine) without abnormal findings: Secondary | ICD-10-CM | POA: Diagnosis not present

## 2022-09-24 DIAGNOSIS — Z1231 Encounter for screening mammogram for malignant neoplasm of breast: Secondary | ICD-10-CM | POA: Diagnosis not present

## 2022-09-24 DIAGNOSIS — Z124 Encounter for screening for malignant neoplasm of cervix: Secondary | ICD-10-CM | POA: Diagnosis not present

## 2022-10-05 ENCOUNTER — Other Ambulatory Visit: Payer: Self-pay | Admitting: Internal Medicine

## 2022-10-05 DIAGNOSIS — M810 Age-related osteoporosis without current pathological fracture: Secondary | ICD-10-CM

## 2022-12-13 ENCOUNTER — Other Ambulatory Visit: Payer: Self-pay | Admitting: Physician Assistant

## 2022-12-13 NOTE — Telephone Encounter (Signed)
Last Fill: 09/20/2022  Eye exam: 07/31/2022 WNL   Labs: 08/01/2022 ANA positive and stable titer.  All other labs are within normal limits.   Next Visit: 01/22/2023  Last Visit: 08/08/2022  [id number] syndrome with keratoconjunctivitis sicca   Current Dose per office note 08/08/2022: Plaquenil 200 mg 1 tablet by mouth by mouth daily.   Okay to refill Plaquenil?

## 2023-01-07 DIAGNOSIS — E7800 Pure hypercholesterolemia, unspecified: Secondary | ICD-10-CM | POA: Diagnosis not present

## 2023-01-07 DIAGNOSIS — E039 Hypothyroidism, unspecified: Secondary | ICD-10-CM | POA: Diagnosis not present

## 2023-01-07 DIAGNOSIS — M810 Age-related osteoporosis without current pathological fracture: Secondary | ICD-10-CM | POA: Diagnosis not present

## 2023-01-07 DIAGNOSIS — I10 Essential (primary) hypertension: Secondary | ICD-10-CM | POA: Diagnosis not present

## 2023-01-07 DIAGNOSIS — E875 Hyperkalemia: Secondary | ICD-10-CM | POA: Diagnosis not present

## 2023-01-16 DIAGNOSIS — I10 Essential (primary) hypertension: Secondary | ICD-10-CM | POA: Diagnosis not present

## 2023-01-16 DIAGNOSIS — M3501 Sicca syndrome with keratoconjunctivitis: Secondary | ICD-10-CM | POA: Diagnosis not present

## 2023-01-16 DIAGNOSIS — Z1339 Encounter for screening examination for other mental health and behavioral disorders: Secondary | ICD-10-CM | POA: Diagnosis not present

## 2023-01-16 DIAGNOSIS — E039 Hypothyroidism, unspecified: Secondary | ICD-10-CM | POA: Diagnosis not present

## 2023-01-16 DIAGNOSIS — M810 Age-related osteoporosis without current pathological fracture: Secondary | ICD-10-CM | POA: Diagnosis not present

## 2023-01-16 DIAGNOSIS — Z0000 Encounter for general adult medical examination without abnormal findings: Secondary | ICD-10-CM | POA: Diagnosis not present

## 2023-01-16 DIAGNOSIS — I471 Supraventricular tachycardia, unspecified: Secondary | ICD-10-CM | POA: Diagnosis not present

## 2023-01-16 DIAGNOSIS — M069 Rheumatoid arthritis, unspecified: Secondary | ICD-10-CM | POA: Diagnosis not present

## 2023-01-16 DIAGNOSIS — E7800 Pure hypercholesterolemia, unspecified: Secondary | ICD-10-CM | POA: Diagnosis not present

## 2023-01-16 DIAGNOSIS — Z23 Encounter for immunization: Secondary | ICD-10-CM | POA: Diagnosis not present

## 2023-01-16 DIAGNOSIS — Z1331 Encounter for screening for depression: Secondary | ICD-10-CM | POA: Diagnosis not present

## 2023-01-17 ENCOUNTER — Other Ambulatory Visit: Payer: Self-pay | Admitting: *Deleted

## 2023-01-17 DIAGNOSIS — M3501 Sicca syndrome with keratoconjunctivitis: Secondary | ICD-10-CM | POA: Diagnosis not present

## 2023-01-17 DIAGNOSIS — R768 Other specified abnormal immunological findings in serum: Secondary | ICD-10-CM | POA: Diagnosis not present

## 2023-01-17 DIAGNOSIS — Z79899 Other long term (current) drug therapy: Secondary | ICD-10-CM

## 2023-01-17 DIAGNOSIS — H401132 Primary open-angle glaucoma, bilateral, moderate stage: Secondary | ICD-10-CM | POA: Diagnosis not present

## 2023-01-17 LAB — CBC WITH DIFFERENTIAL/PLATELET
Absolute Monocytes: 502 cells/uL (ref 200–950)
Basophils Absolute: 30 cells/uL (ref 0–200)
Basophils Relative: 0.5 %
Eosinophils Absolute: 130 cells/uL (ref 15–500)
Eosinophils Relative: 2.2 %
HCT: 45.8 % — ABNORMAL HIGH (ref 35.0–45.0)
Hemoglobin: 14.8 g/dL (ref 11.7–15.5)
MCH: 29.2 pg (ref 27.0–33.0)
MCHC: 32.3 g/dL (ref 32.0–36.0)
MCV: 90.3 fL (ref 80.0–100.0)
MPV: 12 fL (ref 7.5–12.5)
Monocytes Relative: 8.5 %
Neutro Abs: 4071 cells/uL (ref 1500–7800)
Neutrophils Relative %: 69 %
Platelets: 220 10*3/uL (ref 140–400)
RBC: 5.07 10*6/uL (ref 3.80–5.10)
RDW: 12.5 % (ref 11.0–15.0)
Total Lymphocyte: 19.8 %
WBC: 5.9 10*3/uL (ref 3.8–10.8)

## 2023-01-17 LAB — SEDIMENTATION RATE: Sed Rate: 6 mm/h (ref 0–30)

## 2023-01-21 NOTE — Progress Notes (Signed)
ANA remains positive.  RF negative  Ro and La antibodies negative  Rest of workup negative/WNL

## 2023-01-22 ENCOUNTER — Ambulatory Visit: Payer: Medicare Other | Attending: Rheumatology | Admitting: Rheumatology

## 2023-01-22 ENCOUNTER — Encounter: Payer: Self-pay | Admitting: Rheumatology

## 2023-01-22 VITALS — BP 140/86 | HR 76 | Resp 16 | Ht 64.0 in | Wt 139.4 lb

## 2023-01-22 DIAGNOSIS — E559 Vitamin D deficiency, unspecified: Secondary | ICD-10-CM | POA: Diagnosis not present

## 2023-01-22 DIAGNOSIS — M3501 Sicca syndrome with keratoconjunctivitis: Secondary | ICD-10-CM | POA: Diagnosis not present

## 2023-01-22 DIAGNOSIS — R768 Other specified abnormal immunological findings in serum: Secondary | ICD-10-CM | POA: Insufficient documentation

## 2023-01-22 DIAGNOSIS — R5383 Other fatigue: Secondary | ICD-10-CM | POA: Insufficient documentation

## 2023-01-22 DIAGNOSIS — M19041 Primary osteoarthritis, right hand: Secondary | ICD-10-CM | POA: Insufficient documentation

## 2023-01-22 DIAGNOSIS — M19042 Primary osteoarthritis, left hand: Secondary | ICD-10-CM | POA: Diagnosis not present

## 2023-01-22 DIAGNOSIS — Z8639 Personal history of other endocrine, nutritional and metabolic disease: Secondary | ICD-10-CM | POA: Insufficient documentation

## 2023-01-22 DIAGNOSIS — M810 Age-related osteoporosis without current pathological fracture: Secondary | ICD-10-CM | POA: Insufficient documentation

## 2023-01-22 DIAGNOSIS — Z8669 Personal history of other diseases of the nervous system and sense organs: Secondary | ICD-10-CM | POA: Insufficient documentation

## 2023-01-22 DIAGNOSIS — Z79899 Other long term (current) drug therapy: Secondary | ICD-10-CM | POA: Insufficient documentation

## 2023-01-22 DIAGNOSIS — E7800 Pure hypercholesterolemia, unspecified: Secondary | ICD-10-CM | POA: Insufficient documentation

## 2023-01-22 DIAGNOSIS — M7712 Lateral epicondylitis, left elbow: Secondary | ICD-10-CM

## 2023-01-22 DIAGNOSIS — I10 Essential (primary) hypertension: Secondary | ICD-10-CM | POA: Insufficient documentation

## 2023-01-22 DIAGNOSIS — M4004 Postural kyphosis, thoracic region: Secondary | ICD-10-CM | POA: Diagnosis not present

## 2023-01-22 DIAGNOSIS — M25552 Pain in left hip: Secondary | ICD-10-CM

## 2023-01-22 LAB — PROTEIN ELECTROPHORESIS, SERUM, WITH REFLEX
Albumin ELP: 4.4 g/dL (ref 3.8–4.8)
Alpha 1: 0.2 g/dL (ref 0.2–0.3)
Alpha 2: 0.7 g/dL (ref 0.5–0.9)
Beta 2: 0.4 g/dL (ref 0.2–0.5)
Beta Globulin: 0.4 g/dL (ref 0.4–0.6)
Gamma Globulin: 0.8 g/dL (ref 0.8–1.7)
Total Protein: 7 g/dL (ref 6.1–8.1)

## 2023-01-22 LAB — ANTI-NUCLEAR AB-TITER (ANA TITER)
ANA TITER: 1:320 {titer} — ABNORMAL HIGH
ANA Titer 1: 1:80 {titer} — ABNORMAL HIGH

## 2023-01-22 LAB — CBC WITH DIFFERENTIAL/PLATELET: Absolute Lymphocytes: 1168 {cells}/uL (ref 850–3900)

## 2023-01-22 LAB — ANA: Anti Nuclear Antibody (ANA): POSITIVE — AB

## 2023-01-22 LAB — URINALYSIS, ROUTINE W REFLEX MICROSCOPIC
Bilirubin Urine: NEGATIVE
Glucose, UA: NEGATIVE
Hgb urine dipstick: NEGATIVE
Ketones, ur: NEGATIVE
Leukocytes,Ua: NEGATIVE
Nitrite: NEGATIVE
Protein, ur: NEGATIVE
Specific Gravity, Urine: 1.013 (ref 1.001–1.035)
pH: 7.5 (ref 5.0–8.0)

## 2023-01-22 LAB — COMPLETE METABOLIC PANEL WITH GFR
AG Ratio: 1.9 (calc) (ref 1.0–2.5)
ALT: 16 U/L (ref 6–29)
AST: 18 U/L (ref 10–35)
Albumin: 4.6 g/dL (ref 3.6–5.1)
Alkaline phosphatase (APISO): 62 U/L (ref 37–153)
BUN: 12 mg/dL (ref 7–25)
CO2: 29 mmol/L (ref 20–32)
Calcium: 9.8 mg/dL (ref 8.6–10.4)
Chloride: 109 mmol/L (ref 98–110)
Creat: 0.9 mg/dL (ref 0.50–1.05)
Globulin: 2.4 g/dL (ref 1.9–3.7)
Glucose, Bld: 97 mg/dL (ref 65–99)
Potassium: 5 mmol/L (ref 3.5–5.3)
Sodium: 145 mmol/L (ref 135–146)
Total Bilirubin: 1.2 mg/dL (ref 0.2–1.2)
Total Protein: 7 g/dL (ref 6.1–8.1)
eGFR: 69 mL/min/{1.73_m2} (ref >=60)

## 2023-01-22 LAB — C3 AND C4
C3 Complement: 165 mg/dL (ref 83–193)
C4 Complement: 26 mg/dL (ref 15–57)

## 2023-01-22 LAB — PROTEIN / CREATININE RATIO, URINE
Creatinine, Urine: 67 mg/dL (ref 20–275)
Protein/Creat Ratio: 104 mg/g{creat} (ref 24–184)
Protein/Creatinine Ratio: 0.104 mg/mg{creat} (ref 0.024–0.184)
Total Protein, Urine: 7 mg/dL (ref 5–24)

## 2023-01-22 LAB — ANTI-DNA ANTIBODY, DOUBLE-STRANDED: ds DNA Ab: <1 [IU]/mL

## 2023-01-22 LAB — SJOGRENS SYNDROME-A EXTRACTABLE NUCLEAR ANTIBODY: SSA (Ro) (ENA) Antibody, IgG: <1 AI

## 2023-01-22 LAB — SJOGRENS SYNDROME-B EXTRACTABLE NUCLEAR ANTIBODY: SSB (La) (ENA) Antibody, IgG: <1 AI

## 2023-01-22 LAB — RHEUMATOID FACTOR: Rheumatoid fact SerPl-aCnc: <10 [IU]/mL (ref <14)

## 2023-01-22 NOTE — Progress Notes (Signed)
SPEP did not reveal any abnormal protein bands.

## 2023-01-22 NOTE — Patient Instructions (Signed)
Hand Exercises Hand exercises can be helpful for almost anyone. They can strengthen your hands and improve flexibility and movement. The exercises can also increase blood flow to the hands. These results can make your work and daily tasks easier for you. Hand exercises can be especially helpful for people who have joint pain from arthritis or nerve damage from using their hands over and over. These exercises can also help people who injure a hand. Exercises Most of these hand exercises are gentle stretching and motion exercises. It is usually safe to do them often throughout the day. Warming up your hands before exercise may help reduce stiffness. You can do this with gentle massage or by placing your hands in warm water for 10-15 minutes. It is normal to feel some stretching, pulling, tightness, or mild discomfort when you begin new exercises. In time, this will improve. Remember to always be careful and stop right away if you feel sudden, very bad pain or your pain gets worse. You want to get better and be safe. Ask your health care provider which exercises are safe for you. Do exercises exactly as told by your provider and adjust them as told. Do not begin these exercises until told by your provider. Knuckle bend or "claw" fist  Stand or sit with your arm, hand, and all five fingers pointed straight up. Make sure to keep your wrist straight. Gently bend your fingers down toward your palm until the tips of your fingers are touching your palm. Keep your big knuckle straight and only bend the small knuckles in your fingers. Hold this position for 10 seconds. Straighten your fingers back to your starting position. Repeat this exercise 5-10 times with each hand. Full finger fist  Stand or sit with your arm, hand, and all five fingers pointed straight up. Make sure to keep your wrist straight. Gently bend your fingers into your palm until the tips of your fingers are touching the middle of your  palm. Hold this position for 10 seconds. Extend your fingers back to your starting position, stretching every joint fully. Repeat this exercise 5-10 times with each hand. Straight fist  Stand or sit with your arm, hand, and all five fingers pointed straight up. Make sure to keep your wrist straight. Gently bend your fingers at the big knuckle, where your fingers meet your hand, and at the middle knuckle. Keep the knuckle at the tips of your fingers straight and try to touch the bottom of your palm. Hold this position for 10 seconds. Extend your fingers back to your starting position, stretching every joint fully. Repeat this exercise 5-10 times with each hand. Tabletop  Stand or sit with your arm, hand, and all five fingers pointed straight up. Make sure to keep your wrist straight. Gently bend your fingers at the big knuckle, where your fingers meet your hand, as far down as you can. Keep the small knuckles in your fingers straight. Think of forming a tabletop with your fingers. Hold this position for 10 seconds. Extend your fingers back to your starting position, stretching every joint fully. Repeat this exercise 5-10 times with each hand. Finger spread  Place your hand flat on a table with your palm facing down. Make sure your wrist stays straight. Spread your fingers and thumb apart from each other as far as you can until you feel a gentle stretch. Hold this position for 10 seconds. Bring your fingers and thumb tight together again. Hold this position for 10 seconds. Repeat  this exercise 5-10 times with each hand. Making circles  Stand or sit with your arm, hand, and all five fingers pointed straight up. Make sure to keep your wrist straight. Make a circle by touching the tip of your thumb to the tip of your index finger. Hold for 10 seconds. Then open your hand wide. Repeat this motion with your thumb and each of your fingers. Repeat this exercise 5-10 times with each hand. Thumb  motion  Sit with your forearm resting on a table and your wrist straight. Your thumb should be facing up toward the ceiling. Keep your fingers relaxed as you move your thumb. Lift your thumb up as high as you can toward the ceiling. Hold for 10 seconds. Bend your thumb across your palm as far as you can, reaching the tip of your thumb for the small finger (pinkie) side of your palm. Hold for 10 seconds. Repeat this exercise 5-10 times with each hand. Grip strengthening  Hold a stress ball or other soft ball in the middle of your hand. Slowly increase the pressure, squeezing the ball as much as you can without causing pain. Think of bringing the tips of your fingers into the middle of your palm. All of your finger joints should bend when doing this exercise. Hold your squeeze for 10 seconds, then relax. Repeat this exercise 5-10 times with each hand. Contact a health care provider if: Your hand pain or discomfort gets much worse when you do an exercise. Your hand pain or discomfort does not improve within 2 hours after you exercise. If you have either of these problems, stop doing these exercises right away. Do not do them again unless your provider says that you can. Get help right away if: You develop sudden, severe hand pain or swelling. If this happens, stop doing these exercises right away. Do not do them again unless your provider says that you can. This information is not intended to replace advice given to you by your health care provider. Make sure you discuss any questions you have with your health care provider. Document Revised: 04/24/2022 Document Reviewed: 04/24/2022 [employer] Patient Education  2024 [employer] Low Back Sprain or Strain Rehab Ask your health care provider which exercises are safe for you. Do exercises exactly as told by your health care provider and adjust them as directed. It is normal to feel mild stretching, pulling, tightness, or discomfort as you do these  exercises. Stop right away if you feel sudden pain or your pain gets worse. Do not begin these exercises until told by your health care provider. Stretching and range-of-motion exercises These exercises warm up your muscles and joints and improve the movement and flexibility of your back. These exercises also help to relieve pain, numbness, and tingling. Lumbar rotation  Lie on your back on a firm bed or the floor with your knees bent. Straighten your arms out to your sides so each arm forms a 90-degree angle (right angle) with a side of your body. Slowly move (rotate) both of your knees to one side of your body until you feel a stretch in your lower back (lumbar). Try not to let your shoulders lift off the floor. Hold this position for __________ seconds. Tense your abdominal muscles and slowly move your knees back to the starting position. Repeat this exercise on the other side of your body. Repeat __________ times. Complete this exercise __________ times a day. Single knee to chest  Lie on your back on a  firm bed or the floor with both legs straight. Bend one of your knees. Use your hands to move your knee up toward your chest until you feel a gentle stretch in your lower back and buttock. Hold your leg in this position by holding on to the front of your knee. Keep your other leg as straight as possible. Hold this position for __________ seconds. Slowly return to the starting position. Repeat with your other leg. Repeat __________ times. Complete this exercise __________ times a day. Prone extension on elbows  Lie on your abdomen on a firm bed or the floor (prone position). Prop yourself up on your elbows. Use your arms to help lift your chest up until you feel a gentle stretch in your abdomen and your lower back. This will place some of your body weight on your elbows. If this is uncomfortable, try stacking pillows under your chest. Your hips should stay down, against the surface that  you are lying on. Keep your hip and back muscles relaxed. Hold this position for __________ seconds. Slowly relax your upper body and return to the starting position. Repeat __________ times. Complete this exercise __________ times a day. Strengthening exercises These exercises build strength and endurance in your back. Endurance is the ability to use your muscles for a long time, even after they get tired. Pelvic tilt This exercise strengthens the muscles that lie deep in the abdomen. Lie on your back on a firm bed or the floor with your legs extended. Bend your knees so they are pointing toward the ceiling and your feet are flat on the floor. Tighten your lower abdominal muscles to press your lower back against the floor. This motion will tilt your pelvis so your tailbone points up toward the ceiling instead of pointing to your feet or the floor. To help with this exercise, you may place a small towel under your lower back and try to push your back into the towel. Hold this position for __________ seconds. Let your muscles relax completely before you repeat this exercise. Repeat __________ times. Complete this exercise __________ times a day. Alternating arm and leg raises  Get on your hands and knees on a firm surface. If you are on a hard floor, you may want to use padding, such as an exercise mat, to cushion your knees. Line up your arms and legs. Your hands should be directly below your shoulders, and your knees should be directly below your hips. Lift your left leg behind you. At the same time, raise your right arm and straighten it in front of you. Do not lift your leg higher than your hip. Do not lift your arm higher than your shoulder. Keep your abdominal and back muscles tight. Keep your hips facing the ground. Do not arch your back. Keep your balance carefully, and do not hold your breath. Hold this position for __________ seconds. Slowly return to the starting  position. Repeat with your right leg and your left arm. Repeat __________ times. Complete this exercise __________ times a day. Abdominal set with straight leg raise  Lie on your back on a firm bed or the floor. Bend one of your knees and keep your other leg straight. Tense your abdominal muscles and lift your straight leg up, 4-6 inches (10-15 cm) off the ground. Keep your abdominal muscles tight and hold this position for __________ seconds. Do not hold your breath. Do not arch your back. Keep it flat against the ground. Keep your abdominal muscles  tense as you slowly lower your leg back to the starting position. Repeat with your other leg. Repeat __________ times. Complete this exercise __________ times a day. Single leg lower with bent knees Lie on your back on a firm bed or the floor. Tense your abdominal muscles and lift your feet off the floor, one foot at a time, so your knees and hips are bent in 90-degree angles (right angles). Your knees should be over your hips and your lower legs should be parallel to the floor. Keeping your abdominal muscles tense and your knee bent, slowly lower one of your legs so your toe touches the ground. Lift your leg back up to return to the starting position. Do not hold your breath. Do not let your back arch. Keep your back flat against the ground. Repeat with your other leg. Repeat __________ times. Complete this exercise __________ times a day. Posture and body mechanics Good posture and healthy body mechanics can help to relieve stress in your body's tissues and joints. Body mechanics refers to the movements and positions of your body while you do your daily activities. Posture is part of body mechanics. Good posture means: Your spine is in its natural S-curve position (neutral). Your shoulders are pulled back slightly. Your head is not tipped forward (neutral). Follow these guidelines to improve your posture and body mechanics in your everyday  activities. Standing  When standing, keep your spine neutral and your feet about hip-width apart. Keep a slight bend in your knees. Your ears, shoulders, and hips should line up. When you do a task in which you stand in one place for a long time, place one foot up on a stable object that is 2-4 inches (5-10 cm) high, such as a footstool. This helps keep your spine neutral. Sitting  When sitting, keep your spine neutral and keep your feet flat on the floor. Use a footrest, if necessary, and keep your thighs parallel to the floor. Avoid rounding your shoulders, and avoid tilting your head forward. When working at a desk or a computer, keep your desk at a height where your hands are slightly lower than your elbows. Slide your chair under your desk so you are close enough to maintain good posture. When working at a computer, place your monitor at a height where you are looking straight ahead and you do not have to tilt your head forward or downward to look at the screen. Resting When lying down and resting, avoid positions that are most painful for you. If you have pain with activities such as sitting, bending, stooping, or squatting, lie in a position in which your body does not bend very much. For example, avoid curling up on your side with your arms and knees near your chest (fetal position). If you have pain with activities such as standing for a long time or reaching with your arms, lie with your spine in a neutral position and bend your knees slightly. Try the following positions: Lying on your side with a pillow between your knees. Lying on your back with a pillow under your knees. Lifting  When lifting objects, keep your feet at least shoulder-width apart and tighten your abdominal muscles. Bend your knees and hips and keep your spine neutral. It is important to lift using the strength of your legs, not your back. Do not lock your knees straight out. Always ask for help to lift heavy or  awkward objects. This information is not intended to replace advice given  to you by your health care provider. Make sure you discuss any questions you have with your health care provider. Document Revised: 08/13/2022 Document Reviewed: 06/27/2020 [employer] Patient Education  2024 [employer].

## 2023-03-01 DIAGNOSIS — H401132 Primary open-angle glaucoma, bilateral, moderate stage: Secondary | ICD-10-CM | POA: Diagnosis not present

## 2023-03-01 DIAGNOSIS — H1045 Other chronic allergic conjunctivitis: Secondary | ICD-10-CM | POA: Diagnosis not present

## 2023-03-01 DIAGNOSIS — H26493 Other secondary cataract, bilateral: Secondary | ICD-10-CM | POA: Diagnosis not present

## 2023-03-01 DIAGNOSIS — H04123 Dry eye syndrome of bilateral lacrimal glands: Secondary | ICD-10-CM | POA: Diagnosis not present

## 2023-03-01 DIAGNOSIS — H16223 Keratoconjunctivitis sicca, not specified as Sjogren's, bilateral: Secondary | ICD-10-CM | POA: Diagnosis not present

## 2023-03-12 ENCOUNTER — Other Ambulatory Visit: Payer: Self-pay | Admitting: Physician Assistant

## 2023-03-12 NOTE — Telephone Encounter (Signed)
Last Fill: 12/13/2022  Eye exam: 07/31/2022 WNL    Labs: 01/17/2023 ANA remains positive.  RF negative Ro and La antibodies negative Rest of workup negative/WNL  Next Visit: 06/24/2023  Last Visit: 01/22/2023  EX:[id number] syndrome with keratoconjunctivitis sicca   Current Dose per office note 01/22/2023: Plaquenil 200 mg 1 tablet by mouth by mouth daily.   Okay to refill Plaquenil?

## 2023-05-21 DIAGNOSIS — H401132 Primary open-angle glaucoma, bilateral, moderate stage: Secondary | ICD-10-CM | POA: Diagnosis not present

## 2023-05-21 DIAGNOSIS — Z79899 Other long term (current) drug therapy: Secondary | ICD-10-CM | POA: Diagnosis not present

## 2023-05-21 DIAGNOSIS — H2513 Age-related nuclear cataract, bilateral: Secondary | ICD-10-CM | POA: Diagnosis not present

## 2023-06-10 ENCOUNTER — Other Ambulatory Visit: Payer: Self-pay | Admitting: Physician Assistant

## 2023-06-10 NOTE — Progress Notes (Signed)
 Office Visit Note  Patient: Kathleen Mcclain             Date of Birth: [birth year]           MRN: [id number]             PCP: Melida Quitter, MD Referring: Melida Quitter, MD Visit Date: 06/24/2023 Occupation: @GUAROCC @  Subjective:  Discuss lab results   History of Present Illness: Kathleen Mcclain is a 73 y.o. female with history of sjogren's syndrome and osteoarthritis.  Patient remains on Plaquenil 200 mg 1 tablet by mouth by mouth daily.  She is tolerating Plaquenil without any side effects and has not had any recent gaps in therapy.  Patient reports that overall her symptoms have been stable since her last office visit.  She continues to have chronic sicca symptoms which have been unchanged.  She has been using Biotene oral rinse for dry mouth.  Patient states that she was evaluated at her shot on 05/21/2023 by Dr. Lottie Dawson on 06/19/2023.  She has also been seeing the dentist every 4 months.  She denies any swollen lymph nodes recently.  Patient reports that she has started to exercise more recently.  She denies any joint swelling at this time. She has not yet had an updated bone density but the order has been placed.  Patient continues to take Fosamax 70 mg 1 tablet by mouth once weekly and vitamin D 1000 units daily.    Activities of Daily Living:  Patient reports morning stiffness for 0 minutes.   Patient Denies nocturnal pain.  Difficulty dressing/grooming: Denies Difficulty climbing stairs: Denies Difficulty getting out of chair: Denies Difficulty using hands for taps, buttons, cutlery, and/or writing: Reports  Review of Systems  Constitutional:  Positive for fatigue.  HENT:  Positive for mouth dryness and nose dryness. Negative for mouth sores.   Eyes:  Positive for dryness. Negative for pain.  Respiratory:  Negative for shortness of breath and difficulty breathing.   Cardiovascular:  Negative for chest pain and palpitations.  Gastrointestinal:  Negative for blood in stool,  constipation and diarrhea.  Endocrine: Positive for increased urination.  Genitourinary:  Positive for involuntary urination.  Musculoskeletal:  Positive for myalgias and myalgias. Negative for joint pain, gait problem, joint pain, joint swelling, muscle weakness, morning stiffness and muscle tenderness.  Skin:  Negative for color change, rash, hair loss and sensitivity to sunlight.  Allergic/Immunologic: Negative for susceptible to infections.  Neurological:  Positive for headaches. Negative for dizziness.  Hematological:  Negative for swollen glands.  Psychiatric/Behavioral:  Positive for sleep disturbance. Negative for depressed mood. The patient is nervous/anxious.     PMFS History:  Patient Active Problem List   Diagnosis Date Noted   Sjogren's syndrome with keratoconjunctivitis sicca (HCC) 06/20/2020   Hepatic steatosis 04/02/2018   Elevated liver function tests 04/02/2018   Autoimmune disease (HCC) 04/02/2018   Essential hypertension 03/21/2017   Hypothyroidism 03/09/2015   Hyperglycemia 03/09/2015   Allergy    Cataract    Glaucoma    Hypercholesteremia    Personal history of kidney stones     Past Medical History:  Diagnosis Date   Allergy    Arthritis    Cancer (HCC)    pre cancer cells cervical   Cataract    Glaucoma    History of kidney stones    Hypercholesteremia    Hypothyroid    Hypothyroidism    Personal history of kidney stones    Pre-diabetes  Family History  Problem Relation Age of Onset   Alzheimer's disease Mother    Cancer Father        Pancreatic Cancer   Hyperlipidemia Father    Arthritis Maternal Grandmother    Diabetes Maternal Grandfather    Stroke Maternal Grandfather    Cancer Paternal Grandmother    Cancer Paternal Grandfather    Thyroid disease Sister    Celiac disease Sister    Thyroid disease Brother    Anxiety disorder Son    Healthy Son    Healthy Daughter    Healthy Daughter    Past Surgical History:  Procedure  Laterality Date   CATARACT EXTRACTION, BILATERAL     01/2022   CERVICAL BIOPSY  03/2006   pre cancerous cells   COLONOSCOPY     DACRORHINOCYSTOTOMY Left 08/31/2017   Procedure: DACROCYSTORHINOSTOMY (DCR);  Surgeon: Floydene Flock, MD;  Location: [facility] OR;  Service: Ophthalmology;  Laterality: Left;   ETHMOIDECTOMY Left 08/31/2017   Procedure: ETHMOIDECTOMY, anterior;  Surgeon: Floydene Flock, MD;  Location: [facility] OR;  Service: Ophthalmology;  Laterality: Left;   EYE SURGERY Left    tear duct surgery, x4   LIPOMA EXCISION     on back    TEAR DUCT PROBING Left 02/20/2017   Procedure: nasolacrimal duct obstruct dacryocystorhinostomy left eye, anterior ephmoidectomy left eye, probe with stent left eye;  Surgeon: Floydene Flock, MD;  Location: [facility] OR;  Service: Ophthalmology;  Laterality: Left;   TONSILLECTOMY     TUBAL LIGATION  08/1984   Social History   Social History Narrative   Does not work outside of home.    Has 2 sets of grandchildren--1 set lives in this area. 1 set lives at [facility].   Approximately 2013-- Was told "PreDiabetic"---She lost 30 pounds. Very careful with diet now. Does not exercise.   Immunization History  Administered Date(s) Administered   Fluad Quad(high Dose 65+) 01/20/2019, 01/19/2020   Influenza Inj Mdck Quad With Preservative 01/21/2018   Influenza,inj,Quad PF,6+ Mos 03/09/2015, 03/05/2016, 01/30/2017, 01/02/2018   Influenza-Unspecified 01/23/2012, 01/29/2013, 02/01/2014, 03/09/2015, 02/22/2016, 01/21/2017   PFIZER(Purple Top)SARS-COV-2 Vaccination 09/08/2019, 09/29/2019, 11/01/2020   Pneumococcal Conjugate-13 04/02/2018   Pneumococcal Polysaccharide-23 07/15/2019   Td 04/23/2005   Tdap 01/29/2013   Zoster, Live 03/09/2015     Objective: Vital Signs: BP (!) 140/86 (BP Location: Left Arm, Patient Position: Sitting, Cuff Size: Normal)   Pulse 79   Resp 16   Ht 5\' 4"  (1.626 m)   Wt 143 lb (64.9 kg)   BMI 24.55 kg/m    Physical  Exam Vitals and nursing note reviewed.  Constitutional:      Appearance: She is well-developed.  HENT:     Head: Normocephalic and atraumatic.  Eyes:     Conjunctiva/sclera: Conjunctivae normal.  Cardiovascular:     Rate and Rhythm: Normal rate and regular rhythm.     Heart sounds: Normal heart sounds.  Pulmonary:     Effort: Pulmonary effort is normal.     Breath sounds: Normal breath sounds.  Abdominal:     General: Bowel sounds are normal.     Palpations: Abdomen is soft.  Musculoskeletal:     Cervical back: Normal range of motion.  Lymphadenopathy:     Cervical: No cervical adenopathy.  Skin:    General: Skin is warm and dry.     Capillary Refill: Capillary refill takes less than 2 seconds.  Neurological:     Mental Status: She is alert and  oriented to person, place, and time.  Psychiatric:        Behavior: Behavior normal.      Musculoskeletal Exam: C-spine has good range of motion.  Mild thoracic kyphosis noted.  Shoulder joints, elbow joints, wrist joints MCPs, PIPs, DIPs have good range of motion with no synovitis.  Complete fist formation bilaterally.  Hip joints have good range of motion with no groin pain.  Knee joints have good range of motion with no warmth or effusion.  Ankle joints have good range of motion with no tenderness or joint swelling.  CDAI Exam: CDAI Score: -- Patient Global: --; Provider Global: -- Swollen: --; Tender: -- Joint Exam 06/24/2023   No joint exam has been documented for this visit   There is currently no information documented on the homunculus. Go to the Rheumatology activity and complete the homunculus joint exam.  Investigation: No additional findings.  Imaging: No results found.  Recent Labs: Lab Results  Component Value Date   WBC 7.1 06/19/2023   HGB 14.5 06/19/2023   PLT 256 06/19/2023   NA 142 06/19/2023   K 4.5 06/19/2023   CL 106 06/19/2023   CO2 28 06/19/2023   GLUCOSE 103 (H) 06/19/2023   BUN 14 06/19/2023    CREATININE 0.91 06/19/2023   BILITOT 1.3 (H) 06/19/2023   ALKPHOS 79 09/19/2016   AST 18 06/19/2023   ALT 17 06/19/2023   PROT 7.5 06/19/2023   ALBUMIN 4.3 09/19/2016   CALCIUM 9.9 06/19/2023   GFRAA 72 06/10/2020    Speciality Comments: PLQ eye exam: 07/31/2022 WNL Follow up in 1 year. [facility] ([facility])  Procedures:  No procedures performed Allergies: Penicillins and Alphagan [brimonidine]   Assessment / Plan:     Visit Diagnoses: Sjogren's syndrome with keratoconjunctivitis sicca (HCC) - +ANA, positive centromere, negative Ro, negative La antibodies and sicca symptoms:  Patient continues to have chronic sicca symptoms which have been unchanged.  She has been using mouth rinse as needed as well as eyedrops for symptomatic relief.  Discussed the importance of drinking water throughout the day as well as using a humidifier if needed.  She has been following up with her dentist every 4 months and ophthalmology on a yearly basis.  She has no synovitis on examination today.  Her energy level has been stable.  No recent rashes.  She remains on Plaquenil 200 mg 1 tablet by mouth daily.  She is tolerating Plaquenil without any side effects and has not had any recent gaps in therapy.  Patient had updated lab work on 06/19/2023: ANA remains positive, RF negative, ESR within normal limits, complements within normal limits.  No medication changes will be made at this time.  Future orders for the following lab work was placed today to be drawn prior to her next appointment.  She will follow up in 5 months or sooner if needed.  - Plan: C3 and C4, Sedimentation rate, CBC with Differential/Platelet, COMPLETE METABOLIC PANEL WITH GFR, Protein / creatinine ratio, urine, Serum protein electrophoresis with reflex, Anti-DNA antibody, double-stranded  Positive ANA (antinuclear antibody) - Positive ANA stable titer with centromere and homogeneous pattern.  Lab work from 06/19/2023 was reviewed  today in the office: RF negative, ANA remains positive: ANA 1: 320 nuclear centromere, 1: 80NH, complements WNL, dsDNA negative, ESR WNL, and UA normal.  Patient remains on Plaquenil as prescribed.  No new or worsening symptoms.  She will continue to have updated lab work every 5 months--future orders  placed today. - Plan: C3 and C4, Sedimentation rate, CBC with Differential/Platelet, COMPLETE METABOLIC PANEL WITH GFR, Protein / creatinine ratio, urine, Serum protein electrophoresis with reflex, Anti-DNA antibody, double-stranded  Positive double stranded DNA antibody test - dsDNA negative on 06/10/2020, 11/02/2020, 05/02/21,09/28/2021, 03/01/22.  Double-stranded DNA antibody continues to be negative--dsDNA negative on 06/19/23. - Plan: Anti-DNA antibody, double-stranded  High risk medication use -Plaquenil 200 mg 1 tablet by mouth by mouth daily.  PLQ eye exam: 05/21/2023 performed by Dr. Sherryll Burger.  CBC and CMP updated on 06/19/23--reviewed results with the patient today.  She will continue to require updated lab work every 5 months.  Future orders for CBC and CMP placed today. - Plan: CBC with Differential/Platelet, COMPLETE METABOLIC PANEL WITH GFR  Primary osteoarthritis of both hands: No tenderness or synovitis noted.  Complete fist formation bilaterally.  Other fatigue: Chronic, stable.  She is starting to exercise more on a regular basis.  Postural kyphosis of thoracic region: Mild thoracic kyphosis noted.  Age-related osteoporosis without current pathological fracture - DEXA updated on 09/16/20: The BMD measured at Femur Neck Right is 0.663 g/cm2 with a T-score of -2.7.  Fosamax was initiated in November 2022.  She remains on Fosamax as prescribed and has been taking vitamin D 1000 units daily.  Her PCP has placed an order for an updated bone density but she has not yet scheduled it due to the machine being out of order for her last appointment.  Vitamin D deficiency: She is taking vitamin D 1000 units  daily.  Other medical conditions are listed as follows:  Hypercholesteremia  History of hypothyroidism  Essential hypertension: Blood pressure was 140/86 today in the office.  History of glaucoma  Lateral epicondylitis, left elbow: Not currently symptomatic.  Orders: Orders Placed This Encounter  Procedures   C3 and C4   Sedimentation rate   CBC with Differential/Platelet   COMPLETE METABOLIC PANEL WITH GFR   Protein / creatinine ratio, urine   Serum protein electrophoresis with reflex   Anti-DNA antibody, double-stranded   No orders of the defined types were placed in this encounter.   Follow-Up Instructions: Return in about 5 months (around 11/24/2023) for Sjogren's syndrome.   Gearldine Bienenstock, PA-C  Note - This record has been created using Dragon software.  Chart creation errors have been sought, but may not always  have been located. Such creation errors do not reflect on  the standard of medical care.

## 2023-06-10 NOTE — Telephone Encounter (Signed)
 Last Fill: 03/12/2023  Eye exam: 07/31/2022 WNL    Labs: 01/17/2023 ANA remains positive.  RF negative Ro and La antibodies negative Rest of workup negative/WNL  Next Visit: 06/24/2023  Last Visit: 01/22/2023  [id number] syndrome with keratoconjunctivitis sicca   Current Dose per office note 08/08/2023: Plaquenil 200 mg 1 tablet by mouth by mouth daily.   Okay to refill Plaquenil?

## 2023-06-18 ENCOUNTER — Other Ambulatory Visit: Payer: Medicare Other

## 2023-06-19 ENCOUNTER — Other Ambulatory Visit: Payer: Self-pay | Admitting: *Deleted

## 2023-06-19 DIAGNOSIS — Z79899 Other long term (current) drug therapy: Secondary | ICD-10-CM | POA: Diagnosis not present

## 2023-06-19 DIAGNOSIS — H401132 Primary open-angle glaucoma, bilateral, moderate stage: Secondary | ICD-10-CM | POA: Diagnosis not present

## 2023-06-19 DIAGNOSIS — R768 Other specified abnormal immunological findings in serum: Secondary | ICD-10-CM | POA: Diagnosis not present

## 2023-06-20 LAB — URINALYSIS, ROUTINE W REFLEX MICROSCOPIC
Bilirubin Urine: NEGATIVE
Glucose, UA: NEGATIVE
Hgb urine dipstick: NEGATIVE
Ketones, ur: NEGATIVE
Leukocytes,Ua: NEGATIVE
Nitrite: NEGATIVE
Protein, ur: NEGATIVE
Specific Gravity, Urine: 1.006 (ref 1.001–1.035)
pH: 7 (ref 5.0–8.0)

## 2023-06-20 LAB — CBC WITH DIFFERENTIAL/PLATELET
Absolute Lymphocytes: 1519 {cells}/uL (ref 850–3900)
Absolute Monocytes: 604 {cells}/uL (ref 200–950)
Basophils Absolute: 50 {cells}/uL (ref 0–200)
Basophils Relative: 0.7 %
Eosinophils Absolute: 142 {cells}/uL (ref 15–500)
Eosinophils Relative: 2 %
HCT: 44.7 % (ref 35.0–45.0)
Hemoglobin: 14.5 g/dL (ref 11.7–15.5)
MCH: 29.1 pg (ref 27.0–33.0)
MCHC: 32.4 g/dL (ref 32.0–36.0)
MCV: 89.6 fL (ref 80.0–100.0)
MPV: 11.6 fL (ref 7.5–12.5)
Monocytes Relative: 8.5 %
Neutro Abs: 4785 {cells}/uL (ref 1500–7800)
Neutrophils Relative %: 67.4 %
Platelets: 256 10*3/uL (ref 140–400)
RBC: 4.99 10*6/uL (ref 3.80–5.10)
RDW: 12.5 % (ref 11.0–15.0)
Total Lymphocyte: 21.4 %
WBC: 7.1 10*3/uL (ref 3.8–10.8)

## 2023-06-20 LAB — COMPLETE METABOLIC PANEL WITH GFR
AG Ratio: 1.8 (calc) (ref 1.0–2.5)
ALT: 17 U/L (ref 6–29)
AST: 18 U/L (ref 10–35)
Albumin: 4.8 g/dL (ref 3.6–5.1)
Alkaline phosphatase (APISO): 60 U/L (ref 37–153)
BUN: 14 mg/dL (ref 7–25)
CO2: 28 mmol/L (ref 20–32)
Calcium: 9.9 mg/dL (ref 8.6–10.4)
Chloride: 106 mmol/L (ref 98–110)
Creat: 0.91 mg/dL (ref 0.60–1.00)
Globulin: 2.7 g/dL (ref 1.9–3.7)
Glucose, Bld: 103 mg/dL — ABNORMAL HIGH (ref 65–99)
Potassium: 4.5 mmol/L (ref 3.5–5.3)
Sodium: 142 mmol/L (ref 135–146)
Total Bilirubin: 1.3 mg/dL — ABNORMAL HIGH (ref 0.2–1.2)
Total Protein: 7.5 g/dL (ref 6.1–8.1)
eGFR: 68 mL/min/{1.73_m2} (ref >=60)

## 2023-06-20 LAB — SEDIMENTATION RATE: Sed Rate: 6 mm/h (ref 0–30)

## 2023-06-20 LAB — C3 AND C4
C3 Complement: 174 mg/dL (ref 83–193)
C4 Complement: 25 mg/dL (ref 15–57)

## 2023-06-20 LAB — ANTI-DNA ANTIBODY, DOUBLE-STRANDED: ds DNA Ab: <1 [IU]/mL

## 2023-06-20 NOTE — Progress Notes (Signed)
 CBC, CMP, UA, sed rate, complements are normal. Ds DNA pending.

## 2023-06-21 NOTE — Progress Notes (Signed)
 dsDNA negative.

## 2023-06-24 ENCOUNTER — Encounter: Payer: Self-pay | Admitting: Physician Assistant

## 2023-06-24 ENCOUNTER — Ambulatory Visit: Payer: Medicare Other | Attending: Physician Assistant | Admitting: Physician Assistant

## 2023-06-24 VITALS — BP 140/86 | HR 79 | Resp 16 | Ht 64.0 in | Wt 143.0 lb

## 2023-06-24 DIAGNOSIS — Z79899 Other long term (current) drug therapy: Secondary | ICD-10-CM | POA: Diagnosis not present

## 2023-06-24 DIAGNOSIS — Z8639 Personal history of other endocrine, nutritional and metabolic disease: Secondary | ICD-10-CM | POA: Diagnosis not present

## 2023-06-24 DIAGNOSIS — I10 Essential (primary) hypertension: Secondary | ICD-10-CM | POA: Diagnosis not present

## 2023-06-24 DIAGNOSIS — E559 Vitamin D deficiency, unspecified: Secondary | ICD-10-CM | POA: Diagnosis not present

## 2023-06-24 DIAGNOSIS — M7712 Lateral epicondylitis, left elbow: Secondary | ICD-10-CM | POA: Insufficient documentation

## 2023-06-24 DIAGNOSIS — Z8669 Personal history of other diseases of the nervous system and sense organs: Secondary | ICD-10-CM | POA: Diagnosis not present

## 2023-06-24 DIAGNOSIS — M810 Age-related osteoporosis without current pathological fracture: Secondary | ICD-10-CM | POA: Insufficient documentation

## 2023-06-24 DIAGNOSIS — M3501 Sicca syndrome with keratoconjunctivitis: Secondary | ICD-10-CM | POA: Insufficient documentation

## 2023-06-24 DIAGNOSIS — E7800 Pure hypercholesterolemia, unspecified: Secondary | ICD-10-CM | POA: Insufficient documentation

## 2023-06-24 DIAGNOSIS — M19041 Primary osteoarthritis, right hand: Secondary | ICD-10-CM | POA: Insufficient documentation

## 2023-06-24 DIAGNOSIS — M19042 Primary osteoarthritis, left hand: Secondary | ICD-10-CM | POA: Diagnosis not present

## 2023-06-24 DIAGNOSIS — R768 Other specified abnormal immunological findings in serum: Secondary | ICD-10-CM | POA: Insufficient documentation

## 2023-06-24 DIAGNOSIS — R5383 Other fatigue: Secondary | ICD-10-CM | POA: Diagnosis not present

## 2023-06-24 DIAGNOSIS — M4004 Postural kyphosis, thoracic region: Secondary | ICD-10-CM | POA: Diagnosis not present

## 2023-07-18 DIAGNOSIS — M3501 Sicca syndrome with keratoconjunctivitis: Secondary | ICD-10-CM | POA: Diagnosis not present

## 2023-07-18 DIAGNOSIS — E7800 Pure hypercholesterolemia, unspecified: Secondary | ICD-10-CM | POA: Diagnosis not present

## 2023-07-18 DIAGNOSIS — M810 Age-related osteoporosis without current pathological fracture: Secondary | ICD-10-CM | POA: Diagnosis not present

## 2023-07-18 DIAGNOSIS — G4700 Insomnia, unspecified: Secondary | ICD-10-CM | POA: Diagnosis not present

## 2023-07-18 DIAGNOSIS — M069 Rheumatoid arthritis, unspecified: Secondary | ICD-10-CM | POA: Diagnosis not present

## 2023-07-18 DIAGNOSIS — I10 Essential (primary) hypertension: Secondary | ICD-10-CM | POA: Diagnosis not present

## 2023-07-18 DIAGNOSIS — I471 Supraventricular tachycardia, unspecified: Secondary | ICD-10-CM | POA: Diagnosis not present

## 2023-07-30 DIAGNOSIS — M810 Age-related osteoporosis without current pathological fracture: Secondary | ICD-10-CM | POA: Diagnosis not present

## 2023-08-13 DIAGNOSIS — I10 Essential (primary) hypertension: Secondary | ICD-10-CM | POA: Diagnosis not present

## 2023-08-13 DIAGNOSIS — M810 Age-related osteoporosis without current pathological fracture: Secondary | ICD-10-CM | POA: Diagnosis not present

## 2023-08-19 ENCOUNTER — Other Ambulatory Visit (HOSPITAL_COMMUNITY): Payer: Self-pay | Admitting: *Deleted

## 2023-08-20 ENCOUNTER — Encounter (HOSPITAL_COMMUNITY)
Admission: RE | Admit: 2023-08-20 | Discharge: 2023-08-20 | Disposition: A | Discharge status: Home / Self Care | Payer: PRIVATE HEALTH INSURANCE | Source: Ambulatory Visit | Attending: Internal Medicine | Admitting: Internal Medicine

## 2023-08-20 DIAGNOSIS — M810 Age-related osteoporosis without current pathological fracture: Secondary | ICD-10-CM | POA: Insufficient documentation

## 2023-08-20 MED ORDER — ZOLEDRONIC ACID 5 MG/100ML IV SOLN
5.0000 mg | Freq: Once | INTRAVENOUS | Status: AC
  Administered 2023-08-20: 5 mg via INTRAVENOUS

## 2023-08-20 MED ORDER — ZOLEDRONIC ACID 5 MG/100ML IV SOLN
INTRAVENOUS | Status: AC
  Filled 2023-08-20: qty 100

## 2023-09-20 ENCOUNTER — Other Ambulatory Visit: Payer: Self-pay | Admitting: Physician Assistant

## 2023-09-20 NOTE — Telephone Encounter (Signed)
 Last Fill: 06/10/2023  Eye exam: 05/21/2023 WNL   Labs: 06/19/2023 CBC, CMP are normal   Next Visit: 11/27/2023  Last Visit: 06/24/2023  DX: Sjogren's syndrome with keratoconjunctivitis sicca   Current Dose per office note 06/24/2023: Plaquenil  200 mg 1 tablet by mouth by mouth daily  Okay to refill Plaquenil ?

## 2023-10-08 DIAGNOSIS — Z1231 Encounter for screening mammogram for malignant neoplasm of breast: Secondary | ICD-10-CM | POA: Diagnosis not present

## 2023-11-13 DIAGNOSIS — Z79899 Other long term (current) drug therapy: Secondary | ICD-10-CM | POA: Diagnosis not present

## 2023-11-13 DIAGNOSIS — H401132 Primary open-angle glaucoma, bilateral, moderate stage: Secondary | ICD-10-CM | POA: Diagnosis not present

## 2023-11-13 NOTE — Progress Notes (Signed)
 Office Visit Note  Patient: Kathleen Mcclain             Date of Birth: 1950/04/30           MRN: [id number]             PCP: Stephane Leita DEL, MD Referring: Stephane Leita DEL, MD Visit Date: 11/27/2023 Occupation: @GUAROCC @  Subjective:  Dry mouth and dry eyes  History of Present Illness: Kathleen Mcclain is a 73 y.o. female with Sjogren's and osteoarthritis.  She returns today after her last visit in March 2024.  She continues to have dry mouth and dry eyes.  She has been also having some discomfort in the lower back, right hip and also the medial aspect of her right knee joint.  She has not noticed any joint swelling.  No history of parotid swelling or lymphadenopathy.  She denies any shortness of breath.  She has been taking Plaquenil  200 mg p.o. daily.    Activities of Daily Living:  Patient reports morning stiffness for a few minutes.   Patient Denies nocturnal pain.  Difficulty dressing/grooming: Denies Difficulty climbing stairs: Denies Difficulty getting out of chair: Denies Difficulty using hands for taps, buttons, cutlery, and/or writing: Reports  Review of Systems  Constitutional:  Positive for fatigue.  HENT:  Positive for mouth dryness. Negative for mouth sores.   Eyes:  Positive for dryness.  Respiratory:  Negative for shortness of breath.   Cardiovascular:  Negative for chest pain and palpitations.  Gastrointestinal:  Negative for blood in stool, constipation and diarrhea.  Endocrine: Negative for increased urination.  Genitourinary:  Negative for involuntary urination.  Musculoskeletal:  Positive for joint pain, joint pain, joint swelling and morning stiffness. Negative for gait problem, myalgias, muscle weakness, muscle tenderness and myalgias.  Skin:  Positive for hair loss. Negative for color change, rash and sensitivity to sunlight.  Allergic/Immunologic: Negative for susceptible to infections.  Neurological:  Positive for headaches. Negative for dizziness.   Hematological:  Negative for swollen glands.  Psychiatric/Behavioral:  Positive for sleep disturbance. Negative for depressed mood. The patient is not nervous/anxious.     PMFS History:  Patient Active Problem List   Diagnosis Date Noted   Sjogren's syndrome with keratoconjunctivitis sicca (HCC) 06/20/2020   Hepatic steatosis 04/02/2018   Elevated liver function tests 04/02/2018   Autoimmune disease (HCC) 04/02/2018   Essential hypertension 03/21/2017   Hypothyroidism 03/09/2015   Hyperglycemia 03/09/2015   Allergy    Cataract    Glaucoma    Hypercholesteremia    Personal history of kidney stones     Past Medical History:  Diagnosis Date   Allergy    Arthritis    Cancer (HCC)    pre cancer cells cervical   Cataract    Glaucoma    History of kidney stones    Hypercholesteremia    Hypothyroid    Hypothyroidism    Personal history of kidney stones    Pre-diabetes     Family History  Problem Relation Age of Onset   Alzheimer's disease Mother    Cancer Father        Pancreatic Cancer   Hyperlipidemia Father    Arthritis Maternal Grandmother    Diabetes Maternal Grandfather    Stroke Maternal Grandfather    Cancer Paternal Grandmother    Cancer Paternal Grandfather    Thyroid  disease Sister    Celiac disease Sister    Thyroid  disease Brother    Anxiety disorder Son  Healthy Son    Healthy Daughter    Healthy Daughter    Past Surgical History:  Procedure Laterality Date   CATARACT EXTRACTION, BILATERAL     01/2022   CERVICAL BIOPSY  03/2006   pre cancerous cells   COLONOSCOPY     DACRORHINOCYSTOTOMY Left 08/31/2017   Procedure: DACROCYSTORHINOSTOMY (DCR);  Surgeon: Laurie Loyd Redhead, MD;  Location: [facility] OR;  Service: Ophthalmology;  Laterality: Left;   ETHMOIDECTOMY Left 08/31/2017   Procedure: ETHMOIDECTOMY, anterior;  Surgeon: Laurie Loyd Redhead, MD;  Location: [facility] OR;  Service: Ophthalmology;  Laterality: Left;   EYE SURGERY Left    tear duct surgery,  x4   LIPOMA EXCISION     on back    TEAR DUCT PROBING Left 02/20/2017   Procedure: nasolacrimal duct obstruct dacryocystorhinostomy left eye, anterior ephmoidectomy left eye, probe with stent left eye;  Surgeon: Laurie Loyd Redhead, MD;  Location: [facility] OR;  Service: Ophthalmology;  Laterality: Left;   TONSILLECTOMY     TUBAL LIGATION  08/1984   Social History   Social History Narrative   Does not work outside of home.    Has 2 sets of grandchildren--1 set lives in this area. 1 set lives at [facility].   Approximately 2013-- Was told PreDiabetic---She lost 30 pounds. Very careful with diet now. Does not exercise.   Immunization History  Administered Date(s) Administered   Fluad Quad(high Dose 65+) 01/20/2019, 01/19/2020   Influenza Inj Mdck Quad With Preservative 01/21/2018   Influenza,inj,Quad PF,6+ Mos 03/09/2015, 03/05/2016, 01/30/2017, 01/02/2018   Influenza-Unspecified 01/23/2012, 01/29/2013, 02/01/2014, 03/09/2015, 02/22/2016, 01/21/2017   PFIZER(Purple Top)SARS-COV-2 Vaccination 09/08/2019, 09/29/2019, 11/01/2020   Pneumococcal Conjugate-13 04/02/2018   Pneumococcal Polysaccharide-23 07/15/2019   Td 04/23/2005   Tdap 01/29/2013   Zoster, Live 03/09/2015     Objective: Vital Signs: BP (!) 150/81 (BP Location: Left Arm, Patient Position: Sitting, Cuff Size: Normal)   Pulse 73   Resp 16   Ht 5' 4 (1.626 m)   Wt 141 lb 6.4 oz (64.1 kg)   BMI 24.27 kg/m    Physical Exam Vitals and nursing note reviewed.  Constitutional:      Appearance: She is well-developed.  HENT:     Head: Normocephalic and atraumatic.  Eyes:     Conjunctiva/sclera: Conjunctivae normal.  Cardiovascular:     Rate and Rhythm: Normal rate and regular rhythm.     Heart sounds: Normal heart sounds.  Pulmonary:     Effort: Pulmonary effort is normal.     Breath sounds: Normal breath sounds.  Abdominal:     General: Bowel sounds are normal.     Palpations: Abdomen is soft.  Musculoskeletal:      Cervical back: Normal range of motion.  Lymphadenopathy:     Cervical: No cervical adenopathy.  Skin:    General: Skin is warm and dry.     Capillary Refill: Capillary refill takes less than 2 seconds.  Neurological:     Mental Status: She is alert and oriented to person, place, and time.  Psychiatric:        Behavior: Behavior normal.      Musculoskeletal Exam: Cervical, thoracic and lumbar spine were in good range of motion.  She has thoracic kyphosis.  She has some tenderness in the lower lumbar paravertebral region.  Shoulders, elbows, wrist joints with good range of motion without any discomfort.  She had bilateral PIP and DIP thickening with no synovitis.  Hip joints and knee joints with good range of  motion without any warmth swelling or effusion.  There was no tenderness over her ankles or MTPs.  CDAI Exam: CDAI Score: -- Patient Global: --; Provider Global: -- Swollen: --; Tender: -- Joint Exam 11/27/2023   No joint exam has been documented for this visit   There is currently no information documented on the homunculus. Go to the Rheumatology activity and complete the homunculus joint exam.  Investigation: No additional findings.  Imaging: No results found.  Recent Labs: Lab Results  Component Value Date   WBC 5.8 11/22/2023   HGB 14.0 11/22/2023   PLT 215 11/22/2023   NA 142 11/22/2023   K 4.6 11/22/2023   CL 107 11/22/2023   CO2 28 11/22/2023   GLUCOSE 133 (H) 11/22/2023   BUN 14 11/22/2023   CREATININE 0.84 11/22/2023   BILITOT 1.0 11/22/2023   ALKPHOS 79 09/19/2016   AST 16 11/22/2023   ALT 16 11/22/2023   PROT 6.8 11/22/2023   PROT 6.7 11/22/2023   ALBUMIN 4.3 09/19/2016   CALCIUM  10.0 11/22/2023   GFRAA 72 06/10/2020    Speciality Comments: PLQ eye exam: 05/21/2023 WNL Follow up in 1 year. [facility] (in [facility])  Procedures:  No procedures performed Allergies: Penicillins and Alphagan [brimonidine]   Assessment / Plan:     Visit  Diagnoses: Sjogren's syndrome with keratoconjunctivitis sicca (HCC) - +ANA, positive centromere, negative Ro, negative La antibodies and sicca symptoms: She continues to have dry mouth and dry eye symptoms.  Over-the-counter products were discussed.  She denies any history of lymphadenopathy or parotid swelling.  No parotid swelling or lymphadenopathy was noted on the examination.  She denies shortness of breath.  Lungs were clear to auscultation.  She denies any history of palpitations.  Labs from November 22, 2023 were reviewed.  CBC and CMP were normal.  Sed rate normal, complements normal, double-stranded DNA negative.  Positive ANA (antinuclear antibody) - positive ANA stable titer with centromere and homogeneous pattern.  Positive double stranded DNA antibody test - dsDNA negative on 06/10/2020, 11/02/2020, 05/02/21,09/28/2021, 03/01/22.Double-stranded DNA antibody continues to be negative.  High risk medication use - Plaquenil  200 mg 1 tablet by mouth by mouth daily. PLQ eye exam: May 21, 2023. - Plan: CBC with Differential/Platelet, Comprehensive metabolic panel with GFR with next labs.  She was advised annual eye examination.  Information from vaccination was placed in the AVS.  Primary osteoarthritis of both hands-bilateral PIP and DIP thickening.  Joint protection was discussed.  Other fatigue-he continues to have some fatigue.  Postural kyphosis of thoracic region  Chronic bilateral lower back pain without sciatica-she has been experiencing some lower lumbar paravertebral discomfort.  There is no history of radiculopathy.  A handout on lower back exercises was given.  Age-related osteoporosis without current pathological fracture - DEXA updated on 09/16/20: The BMD measured at Femur Neck Right is 0.663 g/cm2 with a T-score of -2.7.  Fosamax was initiated in November 2022.  Patient states now she is on IV Reclast .  Vitamin D  deficiency-she continues to take vitamin D  7000 units  daily.  Hypercholesteremia  History of hypothyroidism  Essential hypertension-blood pressure was elevated at 150/81.  Repeat blood pressure was 149/79.  She was advised to monitor blood pressure closely and follow-up with her PCP.  History of glaucoma  Orders: Orders Placed This Encounter  Procedures   CBC with Differential/Platelet   Comprehensive metabolic panel with GFR   No orders of the defined types were placed in this encounter.  Follow-Up Instructions: Return in about 5 months (around 04/28/2024) for Osteoarthritis, Sjogren's.   Maya Nash, MD  Note - This record has been created using Animal nutritionist.  Chart creation errors have been sought, but may not always  have been located. Such creation errors do not reflect on  the standard of medical care.

## 2023-11-22 ENCOUNTER — Other Ambulatory Visit: Payer: Self-pay

## 2023-11-22 DIAGNOSIS — Z79899 Other long term (current) drug therapy: Secondary | ICD-10-CM | POA: Diagnosis not present

## 2023-11-22 DIAGNOSIS — M3501 Sicca syndrome with keratoconjunctivitis: Secondary | ICD-10-CM

## 2023-11-22 DIAGNOSIS — R768 Other specified abnormal immunological findings in serum: Secondary | ICD-10-CM

## 2023-11-25 ENCOUNTER — Ambulatory Visit: Payer: Self-pay | Admitting: Physician Assistant

## 2023-11-26 LAB — CBC WITH DIFFERENTIAL/PLATELET
Absolute Lymphocytes: 1531 {cells}/uL (ref 850–3900)
Absolute Monocytes: 470 {cells}/uL (ref 200–950)
Basophils Absolute: 52 {cells}/uL (ref 0–200)
Basophils Relative: 0.9 %
Eosinophils Absolute: 238 {cells}/uL (ref 15–500)
Eosinophils Relative: 4.1 %
HCT: 44.7 % (ref 35.0–45.0)
Hemoglobin: 14 g/dL (ref 11.7–15.5)
MCH: 28.6 pg (ref 27.0–33.0)
MCHC: 31.3 g/dL — ABNORMAL LOW (ref 32.0–36.0)
MCV: 91.2 fL (ref 80.0–100.0)
MPV: 12 fL (ref 7.5–12.5)
Monocytes Relative: 8.1 %
Neutro Abs: 3509 {cells}/uL (ref 1500–7800)
Neutrophils Relative %: 60.5 %
Platelets: 215 Thousand/uL (ref 140–400)
RBC: 4.9 Million/uL (ref 3.80–5.10)
RDW: 12.3 % (ref 11.0–15.0)
Total Lymphocyte: 26.4 %
WBC: 5.8 Thousand/uL (ref 3.8–10.8)

## 2023-11-26 LAB — PROTEIN ELECTROPHORESIS, SERUM, WITH REFLEX
Albumin ELP: 4.3 g/dL (ref 3.8–4.8)
Alpha 1: 0.2 g/dL (ref 0.2–0.3)
Alpha 2: 0.7 g/dL (ref 0.5–0.9)
Beta 2: 0.4 g/dL (ref 0.2–0.5)
Beta Globulin: 0.4 g/dL (ref 0.4–0.6)
Gamma Globulin: 0.8 g/dL (ref 0.8–1.7)
Total Protein: 6.8 g/dL (ref 6.1–8.1)

## 2023-11-26 LAB — C3 AND C4
C3 Complement: 152 mg/dL (ref 83–193)
C4 Complement: 23 mg/dL (ref 15–57)

## 2023-11-26 LAB — COMPLETE METABOLIC PANEL WITHOUT GFR
AG Ratio: 2 (calc) (ref 1.0–2.5)
ALT: 16 U/L (ref 6–29)
AST: 16 U/L (ref 10–35)
Albumin: 4.5 g/dL (ref 3.6–5.1)
Alkaline phosphatase (APISO): 47 U/L (ref 37–153)
BUN: 14 mg/dL (ref 7–25)
CO2: 28 mmol/L (ref 20–32)
Calcium: 10 mg/dL (ref 8.6–10.4)
Chloride: 107 mmol/L (ref 98–110)
Creat: 0.84 mg/dL (ref 0.60–1.00)
Globulin: 2.2 g/dL (ref 1.9–3.7)
Glucose, Bld: 133 mg/dL — ABNORMAL HIGH (ref 65–99)
Potassium: 4.6 mmol/L (ref 3.5–5.3)
Sodium: 142 mmol/L (ref 135–146)
Total Bilirubin: 1 mg/dL (ref 0.2–1.2)
Total Protein: 6.7 g/dL (ref 6.1–8.1)

## 2023-11-26 LAB — PROTEIN / CREATININE RATIO, URINE
Creatinine, Urine: 35 mg/dL (ref 20–275)
Total Protein, Urine: <4 mg/dL — ABNORMAL LOW (ref 5–24)

## 2023-11-26 LAB — ANTI-DNA ANTIBODY, DOUBLE-STRANDED: ds DNA Ab: <1 [IU]/mL

## 2023-11-26 LAB — SEDIMENTATION RATE: Sed Rate: 2 mm/h (ref 0–30)

## 2023-11-26 NOTE — Progress Notes (Signed)
 SPEP  normal.

## 2023-11-27 ENCOUNTER — Ambulatory Visit: Attending: Rheumatology | Admitting: Rheumatology

## 2023-11-27 ENCOUNTER — Encounter: Payer: Self-pay | Admitting: Rheumatology

## 2023-11-27 VITALS — BP 149/79 | HR 70 | Resp 16 | Ht 64.0 in | Wt 141.4 lb

## 2023-11-27 DIAGNOSIS — E559 Vitamin D deficiency, unspecified: Secondary | ICD-10-CM | POA: Diagnosis not present

## 2023-11-27 DIAGNOSIS — E7800 Pure hypercholesterolemia, unspecified: Secondary | ICD-10-CM | POA: Diagnosis not present

## 2023-11-27 DIAGNOSIS — G8929 Other chronic pain: Secondary | ICD-10-CM | POA: Diagnosis not present

## 2023-11-27 DIAGNOSIS — M4004 Postural kyphosis, thoracic region: Secondary | ICD-10-CM | POA: Diagnosis not present

## 2023-11-27 DIAGNOSIS — M810 Age-related osteoporosis without current pathological fracture: Secondary | ICD-10-CM | POA: Insufficient documentation

## 2023-11-27 DIAGNOSIS — M19041 Primary osteoarthritis, right hand: Secondary | ICD-10-CM | POA: Diagnosis not present

## 2023-11-27 DIAGNOSIS — Z8669 Personal history of other diseases of the nervous system and sense organs: Secondary | ICD-10-CM | POA: Diagnosis not present

## 2023-11-27 DIAGNOSIS — M19042 Primary osteoarthritis, left hand: Secondary | ICD-10-CM | POA: Diagnosis not present

## 2023-11-27 DIAGNOSIS — R768 Other specified abnormal immunological findings in serum: Secondary | ICD-10-CM | POA: Insufficient documentation

## 2023-11-27 DIAGNOSIS — R5383 Other fatigue: Secondary | ICD-10-CM | POA: Insufficient documentation

## 2023-11-27 DIAGNOSIS — I10 Essential (primary) hypertension: Secondary | ICD-10-CM | POA: Diagnosis not present

## 2023-11-27 DIAGNOSIS — Z8639 Personal history of other endocrine, nutritional and metabolic disease: Secondary | ICD-10-CM | POA: Diagnosis not present

## 2023-11-27 DIAGNOSIS — M545 Low back pain, unspecified: Secondary | ICD-10-CM | POA: Insufficient documentation

## 2023-11-27 DIAGNOSIS — M3501 Sicca syndrome with keratoconjunctivitis: Secondary | ICD-10-CM | POA: Diagnosis not present

## 2023-11-27 DIAGNOSIS — Z79899 Other long term (current) drug therapy: Secondary | ICD-10-CM | POA: Insufficient documentation

## 2023-11-27 NOTE — Patient Instructions (Signed)
 Low Back Sprain or Strain Rehab Ask your health care provider which exercises are safe for you. Do exercises exactly as told by your health care provider and adjust them as directed. It is normal to feel mild stretching, pulling, tightness, or discomfort as you do these exercises. Stop right away if you feel sudden pain or your pain gets worse. Do not begin these exercises until told by your health care provider. Stretching and range-of-motion exercises These exercises warm up your muscles and joints and improve the movement and flexibility of your back. These exercises also help to relieve pain, numbness, and tingling. Lumbar rotation  Lie on your back on a firm bed or the floor with your knees bent. Straighten your arms out to your sides so each arm forms a 90-degree angle (right angle) with a side of your body. Slowly move (rotate) both of your knees to one side of your body until you feel a stretch in your lower back (lumbar). Try not to let your shoulders lift off the floor. Hold this position for __________ seconds. Tense your abdominal muscles and slowly move your knees back to the starting position. Repeat this exercise on the other side of your body. Repeat __________ times. Complete this exercise __________ times a day. Single knee to chest  Lie on your back on a firm bed or the floor with both legs straight. Bend one of your knees. Use your hands to move your knee up toward your chest until you feel a gentle stretch in your lower back and buttock. Hold your leg in this position by holding on to the front of your knee. Keep your other leg as straight as possible. Hold this position for __________ seconds. Slowly return to the starting position. Repeat with your other leg. Repeat __________ times. Complete this exercise __________ times a day. Prone extension on elbows  Lie on your abdomen on a firm bed or the floor (prone position). Prop yourself up on your elbows. Use your arms  to help lift your chest up until you feel a gentle stretch in your abdomen and your lower back. This will place some of your body weight on your elbows. If this is uncomfortable, try stacking pillows under your chest. Your hips should stay down, against the surface that you are lying on. Keep your hip and back muscles relaxed. Hold this position for __________ seconds. Slowly relax your upper body and return to the starting position. Repeat __________ times. Complete this exercise __________ times a day. Strengthening exercises These exercises build strength and endurance in your back. Endurance is the ability to use your muscles for a long time, even after they get tired. Pelvic tilt This exercise strengthens the muscles that lie deep in the abdomen. Lie on your back on a firm bed or the floor with your legs extended. Bend your knees so they are pointing toward the ceiling and your feet are flat on the floor. Tighten your lower abdominal muscles to press your lower back against the floor. This motion will tilt your pelvis so your tailbone points up toward the ceiling instead of pointing to your feet or the floor. To help with this exercise, you may place a small towel under your lower back and try to push your back into the towel. Hold this position for __________ seconds. Let your muscles relax completely before you repeat this exercise. Repeat __________ times. Complete this exercise __________ times a day. Alternating arm and leg raises  Get on your hands  and knees on a firm surface. If you are on a hard floor, you may want to use padding, such as an exercise mat, to cushion your knees. Line up your arms and legs. Your hands should be directly below your shoulders, and your knees should be directly below your hips. Lift your left leg behind you. At the same time, raise your right arm and straighten it in front of you. Do not lift your leg higher than your hip. Do not lift your arm higher  than your shoulder. Keep your abdominal and back muscles tight. Keep your hips facing the ground. Do not arch your back. Keep your balance carefully, and do not hold your breath. Hold this position for __________ seconds. Slowly return to the starting position. Repeat with your right leg and your left arm. Repeat __________ times. Complete this exercise __________ times a day. Abdominal set with straight leg raise  Lie on your back on a firm bed or the floor. Bend one of your knees and keep your other leg straight. Tense your abdominal muscles and lift your straight leg up, 4-6 inches (10-15 cm) off the ground. Keep your abdominal muscles tight and hold this position for __________ seconds. Do not hold your breath. Do not arch your back. Keep it flat against the ground. Keep your abdominal muscles tense as you slowly lower your leg back to the starting position. Repeat with your other leg. Repeat __________ times. Complete this exercise __________ times a day. Single leg lower with bent knees Lie on your back on a firm bed or the floor. Tense your abdominal muscles and lift your feet off the floor, one foot at a time, so your knees and hips are bent in 90-degree angles (right angles). Your knees should be over your hips and your lower legs should be parallel to the floor. Keeping your abdominal muscles tense and your knee bent, slowly lower one of your legs so your toe touches the ground. Lift your leg back up to return to the starting position. Do not hold your breath. Do not let your back arch. Keep your back flat against the ground. Repeat with your other leg. Repeat __________ times. Complete this exercise __________ times a day. Posture and body mechanics Good posture and healthy body mechanics can help to relieve stress in your body's tissues and joints. Body mechanics refers to the movements and positions of your body while you do your daily activities. Posture is part of body  mechanics. Good posture means: Your spine is in its natural S-curve position (neutral). Your shoulders are pulled back slightly. Your head is not tipped forward (neutral). Follow these guidelines to improve your posture and body mechanics in your everyday activities. Standing  When standing, keep your spine neutral and your feet about hip-width apart. Keep a slight bend in your knees. Your ears, shoulders, and hips should line up. When you do a task in which you stand in one place for a long time, place one foot up on a stable object that is 2-4 inches (5-10 cm) high, such as a footstool. This helps keep your spine neutral. Sitting  When sitting, keep your spine neutral and keep your feet flat on the floor. Use a footrest, if necessary, and keep your thighs parallel to the floor. Avoid rounding your shoulders, and avoid tilting your head forward. When working at a desk or a computer, keep your desk at a height where your hands are slightly lower than your elbows. Slide your  chair under your desk so you are close enough to maintain good posture. When working at a computer, place your monitor at a height where you are looking straight ahead and you do not have to tilt your head forward or downward to look at the screen. Resting When lying down and resting, avoid positions that are most painful for you. If you have pain with activities such as sitting, bending, stooping, or squatting, lie in a position in which your body does not bend very much. For example, avoid curling up on your side with your arms and knees near your chest (fetal position). If you have pain with activities such as standing for a long time or reaching with your arms, lie with your spine in a neutral position and bend your knees slightly. Try the following positions: Lying on your side with a pillow between your knees. Lying on your back with a pillow under your knees. Lifting  When lifting objects, keep your feet at least  shoulder-width apart and tighten your abdominal muscles. Bend your knees and hips and keep your spine neutral. It is important to lift using the strength of your legs, not your back. Do not lock your knees straight out. Always ask for help to lift heavy or awkward objects. This information is not intended to replace advice given to you by your health care provider. Make sure you discuss any questions you have with your health care provider. Document Revised: 08/13/2022 Document Reviewed: 06/27/2020 Elsevier Patient Education  2024 [employer]  Vaccines You are taking a medication(s) that can suppress your immune system.  The following immunizations are recommended: Flu annually Covid-19  Td/Tdap (tetanus, diphtheria, pertussis) every 10 years Pneumonia (Prevnar 15 then Pneumovax 23 at least 1 year apart.  Alternatively, can take Prevnar 20 without needing additional dose) Shingrix: 2 doses from 4 weeks to 6 months apart  Please check with your PCP to make sure you are up to date.

## 2023-12-25 ENCOUNTER — Other Ambulatory Visit: Payer: Self-pay | Admitting: Physician Assistant

## 2023-12-25 NOTE — Telephone Encounter (Signed)
 Last Fill: 09/20/2023  Eye exam: 05/21/2023 WNL   Labs: 11/22/2023 Glucose is 133. Rest of CMP WNL.  CBC stable  Complements WNL  DsDNA negative  No proteinuria  ESR WNL Labs are not consistent with a flare.   Next Visit: 04/29/2023  Last Visit: 11/27/2023  DX: Sjogren's syndrome with keratoconjunctivitis sicca   Current Dose per office note 11/27/2023: Plaquenil  200 mg 1 tablet by mouth daily   Okay to refill Plaquenil ?

## 2024-01-22 DIAGNOSIS — E7800 Pure hypercholesterolemia, unspecified: Secondary | ICD-10-CM | POA: Diagnosis not present

## 2024-01-22 DIAGNOSIS — M810 Age-related osteoporosis without current pathological fracture: Secondary | ICD-10-CM | POA: Diagnosis not present

## 2024-01-22 DIAGNOSIS — E039 Hypothyroidism, unspecified: Secondary | ICD-10-CM | POA: Diagnosis not present

## 2024-01-22 DIAGNOSIS — I10 Essential (primary) hypertension: Secondary | ICD-10-CM | POA: Diagnosis not present

## 2024-01-29 DIAGNOSIS — E7800 Pure hypercholesterolemia, unspecified: Secondary | ICD-10-CM | POA: Diagnosis not present

## 2024-01-29 DIAGNOSIS — Z87442 Personal history of urinary calculi: Secondary | ICD-10-CM | POA: Diagnosis not present

## 2024-01-29 DIAGNOSIS — Z860100 Personal history of colon polyps, unspecified: Secondary | ICD-10-CM | POA: Diagnosis not present

## 2024-01-29 DIAGNOSIS — I471 Supraventricular tachycardia, unspecified: Secondary | ICD-10-CM | POA: Diagnosis not present

## 2024-01-29 DIAGNOSIS — G4700 Insomnia, unspecified: Secondary | ICD-10-CM | POA: Diagnosis not present

## 2024-01-29 DIAGNOSIS — Z1331 Encounter for screening for depression: Secondary | ICD-10-CM | POA: Diagnosis not present

## 2024-01-29 DIAGNOSIS — Z23 Encounter for immunization: Secondary | ICD-10-CM | POA: Diagnosis not present

## 2024-01-29 DIAGNOSIS — M069 Rheumatoid arthritis, unspecified: Secondary | ICD-10-CM | POA: Diagnosis not present

## 2024-01-29 DIAGNOSIS — Z1159 Encounter for screening for other viral diseases: Secondary | ICD-10-CM | POA: Diagnosis not present

## 2024-01-29 DIAGNOSIS — M810 Age-related osteoporosis without current pathological fracture: Secondary | ICD-10-CM | POA: Diagnosis not present

## 2024-01-29 DIAGNOSIS — I10 Essential (primary) hypertension: Secondary | ICD-10-CM | POA: Diagnosis not present

## 2024-01-29 DIAGNOSIS — E039 Hypothyroidism, unspecified: Secondary | ICD-10-CM | POA: Diagnosis not present

## 2024-01-29 DIAGNOSIS — M3501 Sicca syndrome with keratoconjunctivitis: Secondary | ICD-10-CM | POA: Diagnosis not present

## 2024-01-29 DIAGNOSIS — H401132 Primary open-angle glaucoma, bilateral, moderate stage: Secondary | ICD-10-CM | POA: Diagnosis not present

## 2024-01-29 DIAGNOSIS — Z0000 Encounter for general adult medical examination without abnormal findings: Secondary | ICD-10-CM | POA: Diagnosis not present

## 2024-01-29 DIAGNOSIS — Z1339 Encounter for screening examination for other mental health and behavioral disorders: Secondary | ICD-10-CM | POA: Diagnosis not present

## 2024-03-04 DIAGNOSIS — H16223 Keratoconjunctivitis sicca, not specified as Sjogren's, bilateral: Secondary | ICD-10-CM | POA: Diagnosis not present

## 2024-03-04 DIAGNOSIS — H5213 Myopia, bilateral: Secondary | ICD-10-CM | POA: Diagnosis not present

## 2024-03-04 DIAGNOSIS — H1045 Other chronic allergic conjunctivitis: Secondary | ICD-10-CM | POA: Diagnosis not present

## 2024-03-04 DIAGNOSIS — H524 Presbyopia: Secondary | ICD-10-CM | POA: Diagnosis not present

## 2024-03-04 DIAGNOSIS — H401132 Primary open-angle glaucoma, bilateral, moderate stage: Secondary | ICD-10-CM | POA: Diagnosis not present

## 2024-03-04 DIAGNOSIS — H26493 Other secondary cataract, bilateral: Secondary | ICD-10-CM | POA: Diagnosis not present

## 2024-03-04 DIAGNOSIS — H52223 Regular astigmatism, bilateral: Secondary | ICD-10-CM | POA: Diagnosis not present

## 2024-03-20 ENCOUNTER — Other Ambulatory Visit: Payer: Self-pay | Admitting: Physician Assistant

## 2024-03-23 NOTE — Telephone Encounter (Signed)
 Last Fill: 12/25/2023  Eye exam: 05/21/2023   Labs: 11/22/2023 Glucose is 133. Rest of CMP WNL.  CBC stable  Complements WNL  DsDNA negative  No proteinuria  ESR WNL Labs are not consistent with a flare.   Next Visit: 04/28/2024  Last Visit: 11/27/2023  IK:Dgnhmzw'd syndrome with keratoconjunctivitis sicca   Current Dose per office note on 11/27/2023: Plaquenil  200 mg 1 tablet by mouth by mouth daily.   Okay to refill Plaquenil ?

## 2024-04-14 NOTE — Progress Notes (Signed)
 "  Office Visit Note  Patient: Kathleen Mcclain             Date of Birth: [birth year]           MRN: [id number]             PCP: Stephane Leita DEL, MD Referring: Stephane Leita DEL, MD Visit Date: 04/28/2024 Occupation: Data Unavailable  Subjective:  Medication monitoring   History of Present Illness: Kathleen Mcclain is a 73 y.o. female with history of sjogren's syndrome and osteoarthritis.  Patient remains on Plaquenil  200 mg 1 tablet by mouth by mouth daily.  She is tolerating Plaquenil  without any side effects and has not had any gaps in therapy.  Patient continues to have chronic sicca symptoms and has been using over-the-counter products for symptomatic relief.  She continues to see the dentist 3 times a year and has been following up closely with ophthalmology.  She will be having an updated Plaquenil  eye examination on 05/21/2024 with Dr. Maree.  She denies any oral or nasal ulcerations.  She denies any symptoms of Raynaud's phenomenon.  She denies any recent rashes.  She denies any increased joint pain or joint swelling at this time.  She has occasional numbness in the right hand especially with repetitive or overuse activities. Patient received IV Reclast  in April 2025.   Activities of Daily Living:  Patient reports morning stiffness for a few minutes.   Patient Reports nocturnal pain.  Difficulty dressing/grooming: Denies Difficulty climbing stairs: Denies Difficulty getting out of chair: Denies Difficulty using hands for taps, buttons, cutlery, and/or writing: Reports  Review of Systems  Constitutional:  Positive for fatigue.  HENT:  Positive for mouth dryness. Negative for mouth sores.   Eyes:  Positive for dryness.  Respiratory:  Negative for shortness of breath.   Cardiovascular:  Negative for chest pain and palpitations.  Gastrointestinal:  Negative for blood in stool, constipation and diarrhea.  Endocrine: Negative for increased urination.  Genitourinary:  Negative for  involuntary urination.  Musculoskeletal:  Positive for joint pain, gait problem, joint pain, morning stiffness and muscle tenderness. Negative for joint swelling, myalgias, muscle weakness and myalgias.  Skin:  Positive for sensitivity to sunlight. Negative for color change, rash and hair loss.  Allergic/Immunologic: Negative for susceptible to infections.  Neurological:  Positive for headaches. Negative for dizziness.  Hematological:  Negative for swollen glands.  Psychiatric/Behavioral:  Positive for sleep disturbance. Negative for depressed mood. The patient is not nervous/anxious.     PMFS History:  Patient Active Problem List   Diagnosis Date Noted   Sjogren's syndrome with keratoconjunctivitis sicca 06/20/2020   Hepatic steatosis 04/02/2018   Elevated liver function tests 04/02/2018   Autoimmune disease 04/02/2018   Essential hypertension 03/21/2017   Hypothyroidism 03/09/2015   Hyperglycemia 03/09/2015   Allergy    Cataract    Glaucoma    Hypercholesteremia    Personal history of kidney stones     Past Medical History:  Diagnosis Date   Allergy    Arthritis    Cancer (HCC)    pre cancer cells cervical   Cataract    Glaucoma    History of kidney stones    Hypercholesteremia    Hypothyroid    Hypothyroidism    Personal history of kidney stones    Pre-diabetes     Family History  Problem Relation Age of Onset   Alzheimer's disease Mother    Cancer Father  Pancreatic Cancer   Hyperlipidemia Father    Arthritis Maternal Grandmother    Diabetes Maternal Grandfather    Stroke Maternal Grandfather    Cancer Paternal Grandmother    Cancer Paternal Grandfather    Thyroid  disease Sister    Celiac disease Sister    Thyroid  disease Brother    Anxiety disorder Son    Healthy Son    Healthy Daughter    Healthy Daughter    Past Surgical History:  Procedure Laterality Date   CATARACT EXTRACTION, BILATERAL     01/2022   CERVICAL BIOPSY  03/2006   pre  cancerous cells   COLONOSCOPY     DACRORHINOCYSTOTOMY Left 08/31/2017   Procedure: DACROCYSTORHINOSTOMY (DCR);  Surgeon: Laurie Loyd Redhead, MD;  Location: [facility] OR;  Service: Ophthalmology;  Laterality: Left;   ETHMOIDECTOMY Left 08/31/2017   Procedure: ETHMOIDECTOMY, anterior;  Surgeon: Laurie Loyd Redhead, MD;  Location: [facility] OR;  Service: Ophthalmology;  Laterality: Left;   EYE SURGERY Left    tear duct surgery, x4   LIPOMA EXCISION     on back    TEAR DUCT PROBING Left 02/20/2017   Procedure: nasolacrimal duct obstruct dacryocystorhinostomy left eye, anterior ephmoidectomy left eye, probe with stent left eye;  Surgeon: Laurie Loyd Redhead, MD;  Location: [facility] OR;  Service: Ophthalmology;  Laterality: Left;   TONSILLECTOMY     TUBAL LIGATION  08/1984   Social History[1] Social History   Social History Narrative   Does not work outside of home.    Has 2 sets of grandchildren--1 set lives in this area. 1 set [place]   Approximately 2013-- Was told PreDiabetic---She lost 30 pounds. Very careful with diet now. Does not exercise.     Immunization History  Administered Date(s) Administered   Fluad Quad(high Dose 65+) 01/20/2019, 01/19/2020   Influenza Inj Mdck Quad With Preservative 01/21/2018   Influenza,inj,Quad PF,6+ Mos 03/09/2015, 03/05/2016, 01/30/2017, 01/02/2018   Influenza-Unspecified 01/23/2012, 01/29/2013, 02/01/2014, 03/09/2015, 02/22/2016, 01/21/2017   PFIZER(Purple Top)SARS-COV-2 Vaccination 09/08/2019, 09/29/2019, 11/01/2020   Pneumococcal Conjugate-13 04/02/2018   Pneumococcal Polysaccharide-23 07/15/2019   Td 04/23/2005   Tdap 01/29/2013   Zoster, Live 03/09/2015     Objective: Vital Signs: BP 139/84   Pulse 75   Temp 98.1 F (36.7 C)   Resp 14   Ht 5' 4 (1.626 m)   Wt 142 lb 3.2 oz (64.5 kg)   BMI 24.41 kg/m    Physical Exam Vitals and nursing note reviewed.  Constitutional:      Appearance: She is well-developed.  HENT:     Head:  Normocephalic and atraumatic.  Eyes:     Conjunctiva/sclera: Conjunctivae normal.  Cardiovascular:     Rate and Rhythm: Normal rate and regular rhythm.     Heart sounds: Normal heart sounds.  Pulmonary:     Effort: Pulmonary effort is normal.     Breath sounds: Normal breath sounds.  Abdominal:     General: Bowel sounds are normal.     Palpations: Abdomen is soft.  Musculoskeletal:     Cervical back: Normal range of motion.  Lymphadenopathy:     Cervical: No cervical adenopathy.  Skin:    General: Skin is warm and dry.     Capillary Refill: Capillary refill takes less than 2 seconds.  Neurological:     Mental Status: She is alert and oriented to person, place, and time.  Psychiatric:        Behavior: Behavior normal.  Musculoskeletal Exam: C-spine, thoracic spine, lumbar spine have good range of motion.  No midline spinal tenderness.  No SI joint tenderness.  Shoulder joints, elbow joints, wrist joints, MCPs, PIPs, DIPs have good range of motion with no synovitis.  Complete fist formation bilaterally.  Hip joints have good range of motion with no groin pain.  Knee joints have good range of motion no warmth or effusion.  Ankle joints have good range of motion no tenderness or joint swelling.  No evidence of Achilles tendinitis or plantar fasciitis.   CDAI Exam: CDAI Score: -- Patient Global: --; Provider Global: -- Swollen: --; Tender: -- Joint Exam 04/28/2024   No joint exam has been documented for this visit   There is currently no information documented on the homunculus. Go to the Rheumatology activity and complete the homunculus joint exam.  Investigation: No additional findings.  Imaging: No results found.  Recent Labs: Lab Results  Component Value Date   WBC 5.8 11/22/2023   HGB 14.0 11/22/2023   PLT 215 11/22/2023   NA 142 11/22/2023   K 4.6 11/22/2023   CL 107 11/22/2023   CO2 28 11/22/2023   GLUCOSE 133 (H) 11/22/2023   BUN 14 11/22/2023    CREATININE 0.84 11/22/2023   BILITOT 1.0 11/22/2023   ALKPHOS 79 09/19/2016   AST 16 11/22/2023   ALT 16 11/22/2023   PROT 6.8 11/22/2023   PROT 6.7 11/22/2023   ALBUMIN 4.3 09/19/2016   CALCIUM  10.0 11/22/2023   GFRAA 72 06/10/2020    Speciality Comments: PLQ eye exam: 05/21/2023 WNL Follow up in 1 year. [facility] (in [facility])  Procedures:  No procedures performed Allergies: Penicillins and Alphagan [brimonidine]     Assessment / Plan:     Visit Diagnoses: Sjogren's syndrome with keratoconjunctivitis sicca - +ANA, positive centromere, negative Ro, negative La antibodies and sicca symptoms: Patient continues to have chronic sicca symptoms.  She has been using over-the-counter products for symptomatic relief.  No oral or nasal ulcerations.  She continues to see the dentist 3 times a year and is following up closely with ophthalmology. No signs of inflammatory arthritis.  No synovitis noted.  She is clinically doing well taking Plaquenil  200 mg 1 tablet by mouth daily. Discussed the increased risk for developing interstitial lung disease in patients with Sjogren syndrome.  Her lungs are clear to auscultation today.  She will notify us  if she develops any new or worsening pulmonary symptoms. Also discussed the increased risk for developing lymphoma in patients with Sjogren syndrome.  Plan to check SPEP, CBC with differential, and sed rate today. Lab work from 11/22/23 was reviewed today in the office: ESR WNL, complements WNL, dsDNA is negative. The following lab work will be updated today.  She will remain on Plaquenil  as prescribed.  She is advised to notify us  if she develops any signs or symptoms of a flare. - Plan: C3 and C4, Comprehensive metabolic panel with GFR, Sedimentation rate, Urinalysis, Routine w reflex microscopic, CBC with Differential/Platelet, ANA, Sjogrens syndrome-A extractable nuclear antibody, Serum protein electrophoresis with reflex, Anti-DNA antibody,  double-stranded  Positive ANA (antinuclear antibody) - Positive ANA stable titer with centromere and homogeneous pattern. Plan to recheck the following lab work today. - Plan: ANA, Anti-DNA antibody, double-stranded  Positive double stranded DNA antibody test - dsDNA negative on 06/10/2020, 11/02/2020, 05/02/21,09/28/2021, 03/01/22. Double-stranded DNA antibody continues to be negative. Plan to check the following lab work today.   - Plan: ANA, Anti-DNA antibody, double-stranded  High risk medication use - Plaquenil  200 mg 1 tablet by mouth by mouth daily.  CBC and CMP updated on 11/22/23.  PLQ eye exam: 05/21/2023 WNL Follow up in 1 year. [facility] (in [facility]  Scheduled for an updated Plaquenil  examination on 05/21/2024. Plan: Comprehensive metabolic panel with GFR, CBC with Differential/Platelet  Primary osteoarthritis of both hands: No synovitis noted.  Complete fist formation bilaterally.  Discussed the importance of joint protection and muscle strengthening.  Other fatigue: Chronic, stable.  Postural kyphosis of thoracic region: No midline spinal tenderness.  Age-related osteoporosis without current pathological fracture - DEXA updated on 09/16/20: The BMD Femur Neck Right is 0.663 g/cm2 with a T-score of -2.7.  Fosamax was initiated in November 2022. She has been switched to IV Reclast --administered in April 2025. She is taking vitamin D  1000 units daily.  Vitamin D  deficiency: She is on vitamin D  1000 units daily.  Other medical conditions are listed as follows:  Hypercholesteremia  History of hypothyroidism  Essential hypertension: Blood pressure was 139/84 today in the office.  History of glaucoma  Orders: Orders Placed This Encounter  Procedures   C3 and C4   Comprehensive metabolic panel with GFR   Sedimentation rate   Urinalysis, Routine w reflex microscopic   CBC with Differential/Platelet   ANA   Sjogrens syndrome-A extractable nuclear antibody   Serum  protein electrophoresis with reflex   Anti-DNA antibody, double-stranded   No orders of the defined types were placed in this encounter.     Follow-Up Instructions: Return in about 5 months (around 09/26/2024) for Sjogren's syndrome, Osteoarthritis.   Waddell CHRISTELLA Craze, PA-C  Note - This record has been created using Dragon software.  Chart creation errors have been sought, but may not always  have been located. Such creation errors do not reflect on  the standard of medical care.     [1]  Social History Tobacco Use   Smoking status: Never    Passive exposure: Never   Smokeless tobacco: Never  Vaping Use   Vaping status: Never Used  Substance Use Topics   Alcohol  use: Yes    Comment: occ   Drug use: Never   "

## 2024-04-28 ENCOUNTER — Encounter: Payer: Self-pay | Admitting: Physician Assistant

## 2024-04-28 ENCOUNTER — Ambulatory Visit: Payer: PRIVATE HEALTH INSURANCE | Attending: Physician Assistant | Admitting: Physician Assistant

## 2024-04-28 VITALS — BP 139/84 | HR 75 | Temp 98.1°F | Resp 14 | Ht 64.0 in | Wt 142.2 lb

## 2024-04-28 DIAGNOSIS — I10 Essential (primary) hypertension: Secondary | ICD-10-CM | POA: Diagnosis not present

## 2024-04-28 DIAGNOSIS — M4004 Postural kyphosis, thoracic region: Secondary | ICD-10-CM | POA: Insufficient documentation

## 2024-04-28 DIAGNOSIS — R7689 Other specified abnormal immunological findings in serum: Secondary | ICD-10-CM | POA: Insufficient documentation

## 2024-04-28 DIAGNOSIS — Z8669 Personal history of other diseases of the nervous system and sense organs: Secondary | ICD-10-CM | POA: Insufficient documentation

## 2024-04-28 DIAGNOSIS — M810 Age-related osteoporosis without current pathological fracture: Secondary | ICD-10-CM | POA: Diagnosis not present

## 2024-04-28 DIAGNOSIS — Z8639 Personal history of other endocrine, nutritional and metabolic disease: Secondary | ICD-10-CM | POA: Diagnosis not present

## 2024-04-28 DIAGNOSIS — M19042 Primary osteoarthritis, left hand: Secondary | ICD-10-CM | POA: Insufficient documentation

## 2024-04-28 DIAGNOSIS — Z79899 Other long term (current) drug therapy: Secondary | ICD-10-CM | POA: Insufficient documentation

## 2024-04-28 DIAGNOSIS — E559 Vitamin D deficiency, unspecified: Secondary | ICD-10-CM | POA: Diagnosis not present

## 2024-04-28 DIAGNOSIS — M3501 Sicca syndrome with keratoconjunctivitis: Secondary | ICD-10-CM | POA: Insufficient documentation

## 2024-04-28 DIAGNOSIS — E7800 Pure hypercholesterolemia, unspecified: Secondary | ICD-10-CM | POA: Insufficient documentation

## 2024-04-28 DIAGNOSIS — M19041 Primary osteoarthritis, right hand: Secondary | ICD-10-CM | POA: Insufficient documentation

## 2024-04-28 DIAGNOSIS — R5383 Other fatigue: Secondary | ICD-10-CM | POA: Insufficient documentation

## 2024-04-29 ENCOUNTER — Ambulatory Visit: Payer: Self-pay | Admitting: Physician Assistant

## 2024-04-29 NOTE — Progress Notes (Signed)
 MCHC is borderline low, rest of CBC WNL. We will continue to monitor.  CMP WNL.   UA normal  Complements WNL ESR WNL

## 2024-04-30 NOTE — Progress Notes (Signed)
 dsDNA negative  Ro antibody negative

## 2024-05-01 LAB — URINALYSIS, ROUTINE W REFLEX MICROSCOPIC
Bilirubin Urine: NEGATIVE
Glucose, UA: NEGATIVE
Hgb urine dipstick: NEGATIVE
Ketones, ur: NEGATIVE
Leukocytes,Ua: NEGATIVE
Nitrite: NEGATIVE
Protein, ur: NEGATIVE
Specific Gravity, Urine: 1.008 (ref 1.001–1.035)
pH: 7.5 (ref 5.0–8.0)

## 2024-05-01 LAB — SEDIMENTATION RATE: Sed Rate: 6 mm/h (ref 0–30)

## 2024-05-01 LAB — ANTI-NUCLEAR AB-TITER (ANA TITER)
ANA TITER: 1:320 {titer} — ABNORMAL HIGH
ANA Titer 1: 1:320 {titer} — ABNORMAL HIGH

## 2024-05-01 LAB — C3 AND C4
C3 Complement: 164 mg/dL (ref 83–193)
C4 Complement: 25 mg/dL (ref 15–57)

## 2024-05-01 LAB — COMPREHENSIVE METABOLIC PANEL WITH GFR
AG Ratio: 2 (calc) (ref 1.0–2.5)
ALT: 17 U/L (ref 6–29)
AST: 20 U/L (ref 10–35)
Albumin: 4.7 g/dL (ref 3.6–5.1)
Alkaline phosphatase (APISO): 62 U/L (ref 37–153)
BUN: 16 mg/dL (ref 7–25)
CO2: 28 mmol/L (ref 20–32)
Calcium: 9.8 mg/dL (ref 8.6–10.4)
Chloride: 105 mmol/L (ref 98–110)
Creat: 0.87 mg/dL (ref 0.60–1.00)
Globulin: 2.4 g/dL (ref 1.9–3.7)
Glucose, Bld: 89 mg/dL (ref 65–99)
Potassium: 4.2 mmol/L (ref 3.5–5.3)
Sodium: 142 mmol/L (ref 135–146)
Total Bilirubin: 0.9 mg/dL (ref 0.2–1.2)
Total Protein: 7.1 g/dL (ref 6.1–8.1)
eGFR: 71 mL/min/1.73m2

## 2024-05-01 LAB — CBC WITH DIFFERENTIAL/PLATELET
Absolute Lymphocytes: 1534 {cells}/uL (ref 850–3900)
Absolute Monocytes: 663 {cells}/uL (ref 200–950)
Basophils Absolute: 39 {cells}/uL (ref 0–200)
Basophils Relative: 0.6 %
Eosinophils Absolute: 208 {cells}/uL (ref 15–500)
Eosinophils Relative: 3.2 %
HCT: 45.1 % (ref 35.9–46.0)
Hemoglobin: 14.2 g/dL (ref 11.7–15.5)
MCH: 28.3 pg (ref 27.0–33.0)
MCHC: 31.5 g/dL — ABNORMAL LOW (ref 31.6–35.4)
MCV: 89.8 fL (ref 81.4–101.7)
MPV: 11.6 fL (ref 7.5–12.5)
Monocytes Relative: 10.2 %
Neutro Abs: 4056 {cells}/uL (ref 1500–7800)
Neutrophils Relative %: 62.4 %
Platelets: 260 Thousand/uL (ref 140–400)
RBC: 5.02 Million/uL (ref 3.80–5.10)
RDW: 12.6 % (ref 11.0–15.0)
Total Lymphocyte: 23.6 %
WBC: 6.5 Thousand/uL (ref 3.8–10.8)

## 2024-05-01 LAB — PROTEIN ELECTROPHORESIS, SERUM, WITH REFLEX
Albumin ELP: 4.5 g/dL (ref 3.8–4.8)
Alpha 1: 0.3 g/dL (ref 0.2–0.3)
Alpha 2: 0.8 g/dL (ref 0.5–0.9)
Beta 2: 0.4 g/dL (ref 0.2–0.5)
Beta Globulin: 0.4 g/dL (ref 0.4–0.6)
Gamma Globulin: 0.8 g/dL (ref 0.8–1.7)
Total Protein: 7.2 g/dL (ref 6.1–8.1)

## 2024-05-01 LAB — ANA: Anti Nuclear Antibody (ANA): POSITIVE — AB

## 2024-05-01 LAB — ANTI-DNA ANTIBODY, DOUBLE-STRANDED: ds DNA Ab: <1 [IU]/mL

## 2024-05-01 LAB — SJOGRENS SYNDROME-A EXTRACTABLE NUCLEAR ANTIBODY: SSA (Ro) (ENA) Antibody, IgG: <1 AI

## 2024-05-01 NOTE — Progress Notes (Signed)
 SPEP did not reveal any abnormal protein bands.  ANA pending.

## 2024-05-01 NOTE — Progress Notes (Signed)
ANA remains positive.  No changes recommended at this time.

## 2024-09-28 ENCOUNTER — Ambulatory Visit: Payer: PRIVATE HEALTH INSURANCE | Admitting: Physician Assistant
